# Patient Record
Sex: Female | Born: 1945
Health system: Southern US, Community
[De-identification: ages and names within clinical notes are randomized; demographics above are authoritative.]

## PROBLEM LIST (undated history)

## (undated) DIAGNOSIS — K219 Gastro-esophageal reflux disease without esophagitis: Secondary | ICD-10-CM

## (undated) DIAGNOSIS — D239 Other benign neoplasm of skin, unspecified: Secondary | ICD-10-CM

## (undated) DIAGNOSIS — C439 Malignant melanoma of skin, unspecified: Secondary | ICD-10-CM

## (undated) DIAGNOSIS — K3184 Gastroparesis: Secondary | ICD-10-CM

## (undated) DIAGNOSIS — I10 Essential (primary) hypertension: Secondary | ICD-10-CM

## (undated) HISTORY — DX: Essential (primary) hypertension: I10

## (undated) HISTORY — PX: ABDOMINAL HYSTERECTOMY: SHX81

## (undated) HISTORY — DX: Gastro-esophageal reflux disease without esophagitis: K21.9

## (undated) HISTORY — PX: TRIGGER FINGER RELEASE: SHX641

## (undated) HISTORY — DX: Other benign neoplasm of skin, unspecified: D23.9

## (undated) HISTORY — DX: Gastroparesis: K31.84

## (undated) HISTORY — PX: CARPAL TUNNEL RELEASE: SHX101

## (undated) HISTORY — DX: Malignant melanoma of skin, unspecified: C43.9

---

## 1998-07-06 ENCOUNTER — Emergency Department (HOSPITAL_COMMUNITY): Admission: EM | Admit: 1998-07-06 | Discharge: 1998-07-06 | Payer: Self-pay | Admitting: *Deleted

## 1998-08-12 ENCOUNTER — Ambulatory Visit (HOSPITAL_COMMUNITY): Admission: RE | Admit: 1998-08-12 | Discharge: 1998-08-12 | Payer: Self-pay | Admitting: Obstetrics and Gynecology

## 2000-01-05 ENCOUNTER — Ambulatory Visit (HOSPITAL_COMMUNITY): Admission: RE | Admit: 2000-01-05 | Discharge: 2000-01-05 | Payer: Self-pay | Admitting: Gastroenterology

## 2000-01-05 ENCOUNTER — Encounter: Payer: Self-pay | Admitting: Gastroenterology

## 2000-01-14 ENCOUNTER — Encounter: Payer: Self-pay | Admitting: Obstetrics and Gynecology

## 2000-01-14 ENCOUNTER — Ambulatory Visit (HOSPITAL_COMMUNITY): Admission: RE | Admit: 2000-01-14 | Discharge: 2000-01-14 | Payer: Self-pay | Admitting: Obstetrics and Gynecology

## 2000-03-22 ENCOUNTER — Ambulatory Visit: Admission: RE | Admit: 2000-03-22 | Discharge: 2000-03-22 | Payer: Self-pay | Admitting: Family Medicine

## 2000-04-19 ENCOUNTER — Other Ambulatory Visit: Admission: RE | Admit: 2000-04-19 | Discharge: 2000-04-19 | Payer: Self-pay | Admitting: Obstetrics and Gynecology

## 2000-10-12 ENCOUNTER — Encounter: Admission: RE | Admit: 2000-10-12 | Discharge: 2001-01-10 | Payer: Self-pay | Admitting: Family Medicine

## 2001-04-16 ENCOUNTER — Other Ambulatory Visit: Admission: RE | Admit: 2001-04-16 | Discharge: 2001-04-16 | Payer: Self-pay | Admitting: Obstetrics and Gynecology

## 2001-04-30 ENCOUNTER — Encounter: Payer: Self-pay | Admitting: Obstetrics and Gynecology

## 2001-04-30 ENCOUNTER — Encounter: Admission: RE | Admit: 2001-04-30 | Discharge: 2001-04-30 | Payer: Self-pay | Admitting: Obstetrics and Gynecology

## 2002-05-01 ENCOUNTER — Encounter: Admission: RE | Admit: 2002-05-01 | Discharge: 2002-05-01 | Payer: Self-pay | Admitting: Obstetrics and Gynecology

## 2002-05-01 ENCOUNTER — Encounter: Payer: Self-pay | Admitting: Obstetrics and Gynecology

## 2002-05-01 ENCOUNTER — Other Ambulatory Visit: Admission: RE | Admit: 2002-05-01 | Discharge: 2002-05-01 | Payer: Self-pay | Admitting: Obstetrics and Gynecology

## 2002-06-18 ENCOUNTER — Encounter: Payer: Self-pay | Admitting: Emergency Medicine

## 2002-06-18 ENCOUNTER — Emergency Department (HOSPITAL_COMMUNITY): Admission: EM | Admit: 2002-06-18 | Discharge: 2002-06-18 | Payer: Self-pay | Admitting: Emergency Medicine

## 2003-02-03 ENCOUNTER — Encounter: Payer: Self-pay | Admitting: Obstetrics and Gynecology

## 2003-02-03 ENCOUNTER — Encounter: Admission: RE | Admit: 2003-02-03 | Discharge: 2003-02-03 | Payer: Self-pay | Admitting: Obstetrics and Gynecology

## 2003-04-10 ENCOUNTER — Other Ambulatory Visit: Admission: RE | Admit: 2003-04-10 | Discharge: 2003-04-10 | Payer: Self-pay | Admitting: Obstetrics and Gynecology

## 2003-05-15 ENCOUNTER — Encounter: Admission: RE | Admit: 2003-05-15 | Discharge: 2003-05-15 | Payer: Self-pay | Admitting: Gastroenterology

## 2003-05-15 ENCOUNTER — Encounter: Payer: Self-pay | Admitting: Gastroenterology

## 2004-04-19 ENCOUNTER — Encounter: Admission: RE | Admit: 2004-04-19 | Discharge: 2004-04-19 | Payer: Self-pay | Admitting: Obstetrics and Gynecology

## 2004-06-18 ENCOUNTER — Other Ambulatory Visit: Admission: RE | Admit: 2004-06-18 | Discharge: 2004-06-18 | Payer: Self-pay | Admitting: Obstetrics and Gynecology

## 2004-07-16 ENCOUNTER — Emergency Department (HOSPITAL_COMMUNITY): Admission: EM | Admit: 2004-07-16 | Discharge: 2004-07-16 | Payer: Self-pay | Admitting: Emergency Medicine

## 2005-03-11 ENCOUNTER — Ambulatory Visit: Payer: Self-pay | Admitting: Gastroenterology

## 2005-04-06 ENCOUNTER — Ambulatory Visit: Payer: Self-pay | Admitting: Gastroenterology

## 2005-04-18 ENCOUNTER — Encounter: Admission: RE | Admit: 2005-04-18 | Discharge: 2005-04-18 | Payer: Self-pay | Admitting: Obstetrics and Gynecology

## 2005-05-23 ENCOUNTER — Other Ambulatory Visit: Admission: RE | Admit: 2005-05-23 | Discharge: 2005-05-23 | Payer: Self-pay | Admitting: Obstetrics and Gynecology

## 2005-07-25 ENCOUNTER — Encounter: Admission: RE | Admit: 2005-07-25 | Discharge: 2005-10-23 | Payer: Self-pay | Admitting: Internal Medicine

## 2005-09-20 ENCOUNTER — Ambulatory Visit: Payer: Self-pay | Admitting: Gastroenterology

## 2005-12-02 ENCOUNTER — Ambulatory Visit: Payer: Self-pay | Admitting: Gastroenterology

## 2005-12-09 ENCOUNTER — Ambulatory Visit: Payer: Self-pay | Admitting: Gastroenterology

## 2005-12-20 ENCOUNTER — Ambulatory Visit (HOSPITAL_COMMUNITY): Admission: RE | Admit: 2005-12-20 | Discharge: 2005-12-20 | Payer: Self-pay | Admitting: Gastroenterology

## 2006-01-02 ENCOUNTER — Ambulatory Visit: Payer: Self-pay | Admitting: Gastroenterology

## 2006-02-21 ENCOUNTER — Ambulatory Visit (HOSPITAL_COMMUNITY): Admission: RE | Admit: 2006-02-21 | Discharge: 2006-02-21 | Payer: Self-pay | Admitting: Obstetrics and Gynecology

## 2006-04-15 ENCOUNTER — Emergency Department (HOSPITAL_COMMUNITY): Admission: EM | Admit: 2006-04-15 | Discharge: 2006-04-15 | Payer: Self-pay | Admitting: Emergency Medicine

## 2006-05-11 ENCOUNTER — Other Ambulatory Visit: Admission: RE | Admit: 2006-05-11 | Discharge: 2006-05-11 | Payer: Self-pay | Admitting: Obstetrics and Gynecology

## 2007-01-10 ENCOUNTER — Ambulatory Visit (HOSPITAL_BASED_OUTPATIENT_CLINIC_OR_DEPARTMENT_OTHER): Admission: RE | Admit: 2007-01-10 | Discharge: 2007-01-10 | Payer: Self-pay | Admitting: *Deleted

## 2007-04-23 ENCOUNTER — Encounter: Admission: RE | Admit: 2007-04-23 | Discharge: 2007-04-23 | Payer: Self-pay | Admitting: Obstetrics and Gynecology

## 2007-05-31 ENCOUNTER — Emergency Department (HOSPITAL_COMMUNITY): Admission: EM | Admit: 2007-05-31 | Discharge: 2007-05-31 | Payer: Self-pay | Admitting: Emergency Medicine

## 2007-06-13 ENCOUNTER — Ambulatory Visit (HOSPITAL_BASED_OUTPATIENT_CLINIC_OR_DEPARTMENT_OTHER): Admission: RE | Admit: 2007-06-13 | Discharge: 2007-06-13 | Payer: Self-pay | Admitting: *Deleted

## 2007-09-17 ENCOUNTER — Encounter: Admission: RE | Admit: 2007-09-17 | Discharge: 2007-09-17 | Payer: Self-pay | Admitting: Obstetrics and Gynecology

## 2008-01-22 ENCOUNTER — Ambulatory Visit (HOSPITAL_BASED_OUTPATIENT_CLINIC_OR_DEPARTMENT_OTHER): Admission: RE | Admit: 2008-01-22 | Discharge: 2008-01-22 | Payer: Self-pay | Admitting: Orthopedic Surgery

## 2008-05-06 ENCOUNTER — Encounter: Admission: RE | Admit: 2008-05-06 | Discharge: 2008-05-06 | Payer: Self-pay | Admitting: Obstetrics and Gynecology

## 2008-11-18 ENCOUNTER — Encounter (HOSPITAL_COMMUNITY): Admission: RE | Admit: 2008-11-18 | Discharge: 2009-02-16 | Payer: Self-pay | Admitting: Endocrinology

## 2009-02-05 ENCOUNTER — Encounter: Admission: RE | Admit: 2009-02-05 | Discharge: 2009-02-05 | Payer: Self-pay | Admitting: Obstetrics and Gynecology

## 2009-10-10 ENCOUNTER — Emergency Department (HOSPITAL_COMMUNITY): Admission: EM | Admit: 2009-10-10 | Discharge: 2009-10-10 | Payer: Self-pay | Admitting: Emergency Medicine

## 2010-05-28 ENCOUNTER — Encounter: Payer: Self-pay | Admitting: Gastroenterology

## 2010-11-08 ENCOUNTER — Encounter
Admission: RE | Admit: 2010-11-08 | Discharge: 2010-11-08 | Payer: Self-pay | Source: Home / Self Care | Attending: Obstetrics and Gynecology | Admitting: Obstetrics and Gynecology

## 2010-11-14 ENCOUNTER — Encounter: Payer: Self-pay | Admitting: Obstetrics and Gynecology

## 2010-11-25 NOTE — Letter (Signed)
Summary: Colonoscopy Letter  [facility]  [address] [place], [place] [address]   Phone: [phone]  Fax: [phone]      May 28, 2010 MRN: [id number]   [facility] [address] [place], [place]  [address]   Dear [name],   According to your medical record, it is time for you to schedule a Colonoscopy. The American Cancer Society recommends this procedure as a method to detect early colon cancer. Patients with a family history of colon cancer, or a personal history of colon polyps or inflammatory bowel disease are at increased risk.  This letter has been generated based on the recommendations made at the time of your procedure. If you feel that in your particular situation this may no longer apply, please contact our office.  Please call our office at [phone] to schedule this appointment or to update your records at your earliest convenience.  Thank you for cooperating with [place] to provide you with the very best care possible.   Sincerely,  Barbette Hair. Arlyce Dice, M.D.  [facility] [phone]

## 2011-01-24 LAB — GLUCOSE, CAPILLARY: Glucose-Capillary: 178 mg/dL — ABNORMAL HIGH (ref 70–99)

## 2011-01-24 LAB — CBC
HCT: 35.8 % — ABNORMAL LOW (ref 36.0–46.0)
MCV: 81.6 fL (ref 78.0–100.0)
Platelets: 225 10*3/uL (ref 150–400)

## 2011-01-24 LAB — BASIC METABOLIC PANEL
BUN: 8 mg/dL (ref 6–23)
Chloride: 104 mEq/L (ref 96–112)
GFR calc non Af Amer: >60 mL/min (ref >60)
Glucose, Bld: 157 mg/dL — ABNORMAL HIGH (ref 70–99)

## 2011-01-24 LAB — DIFFERENTIAL
Basophils Absolute: 0.1 10*3/uL (ref 0.0–0.1)
Lymphs Abs: 3.2 10*3/uL (ref 0.7–4.0)
Monocytes Absolute: 0.6 10*3/uL (ref 0.1–1.0)
Monocytes Relative: 7 % (ref 3–12)

## 2011-03-08 NOTE — Op Note (Signed)
NAMECATHELEEN, Jamie Burke              ACCOUNT NO.:  [id number]   MEDICAL RECORD NO.:  [id number]          PATIENT TYPE:  AMB   LOCATION:  DSC                          FACILITY:  [facility]   PHYSICIAN:  Katy Fitch. Sypher, M.D. DATE OF BIRTH:  1946/06/20   DATE OF PROCEDURE:  01/22/2008  DATE OF DISCHARGE:                               OPERATIVE REPORT   PREOPERATIVE DIAGNOSIS:  Severe left carpal tunnel syndrome with type 2  diabetes, insulin dependent.   POSTOPERATIVE DIAGNOSIS:  Severe left carpal tunnel syndrome with type 2  diabetes, insulin dependent.   OPERATION:  Release of left transverse carpal ligament with two incision  technique due to a rather substantial distal forearm accessory  transverse carpal ligament.   OPERATING SURGEON:  Katy Fitch. Sypher, M.D.   ASSISTANT:  Jonni Sanger, P.A.   ANESTHESIA:  General by LMA.   SUPERVISING ANESTHESIOLOGIST:  Guadalupe Maple, M.D.   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  She has a history of type 2 diabetes  currently treated with insulin.  She has had severe left hand numbness  and shoulder pain.  Clinical examination revealed signs of profound  impingement and rotator cuff tendinopathy and clinical left carpal  tunnel syndrome.  Electrodiagnostic studies confirmed very severe delay  of her motor and sensory conductions, and her physical examination  documented thenar atrophy.   Due to a failure to respond to nonoperative measures, she is brought to  the operating room at this time for release of her left transverse  carpal ligament.   PROCEDURE:  Jamie Burke is brought to the operating room and placed  in supine position on the operating table.   Following the induction of general anesthesia by LMA technique under Dr.  Morley Kos strict supervision, Jamie Burke was prepped with  Betadine soap and solution, and sterilely draped.  A pneumatic  tourniquet  was applied to the proximal left brachium.  Following  exsanguination of the left Burke with an Esmarch bandage, the arterial  tourniquet was inflated to 220 mmHg.   The procedure commenced with a short incision in the line of the ring  finger in the palm.  The subcutaneous tissues were carefully divided,  identifying the palmar fascia.  The fascia was split in line of its  fibers, revealing the superficial palmar arch and the distal margin of  the transverse carpal ligament.  The common sensory branches of the  median nerve were followed back to the median nerve proper.  The median  nerve was gently separated from the transverse carpal ligament with the  aid of a [place] 4 elevator followed by subcutaneous release of the  ulnar aspect of the transverse carpal ligament extending into the distal  forearm.   A very thick band of fascia was noted at the proximal wrist flexion  crease.  This was not able to be release safely through the palmar  incision.  Therefore, a second transverse incision was fashioned in the  distal palmar crease.   Care was taken to identify the  palmaris longus tendon.  The fascia was  isolated and subsequently split with scissors 5 cm above the distal  wrist flexion crease.   The ulnar bursa was noted be quite fibrotic.  The median nerve was  edematous.  There no masses or other predicaments noted.   The wound was then inspected for bleeding points followed by repair of  the skin with intradermal 3-0 Prolene suture.   A compressive dressing was applied with a volar plaster splint,  maintaining the wrist in 5 degrees of dorsiflexion.   There were no apparent complications.   Jamie Burke tolerated surgery and anesthesia well.  She was transferred to  the recovery room with stable signs.  For aftercare, she is provided a  prescription for Percocet 5 mg one p.o. q.4-6h. p.r.n. pain, 20 tablets  without refill.      Katy Fitch Sypher, M.D.  Electronically  Signed     RVS/MEDQ  D:  01/22/2008  T:  01/22/2008  Job:  [id number]   cc:   Leonie Man, M.D.

## 2011-03-08 NOTE — Op Note (Signed)
NAMEBRIT, CARBONELL              ACCOUNT NO.:  [id number]   MEDICAL RECORD NO.:  [id number]          PATIENT TYPE:  AMB   LOCATION:  DSC                          FACILITY:  [facility]   PHYSICIAN:  Lowell Bouton, M.D.DATE OF BIRTH:  [birth year]   DATE OF PROCEDURE:  06/13/2007  DATE OF DISCHARGE:                               OPERATIVE REPORT   PREOPERATIVE DIAGNOSIS:  Right ring trigger finger.   POSTOPERATIVE DIAGNOSIS:  Right ring trigger finger.   PROCEDURE:  Release of A1 pulley right ring finger.   SURGEON:  Lowell Bouton, M.D.   ANESTHESIA:  0.5% Marcaine local with sedation.   OPERATIVE FINDINGS:  The patient had some tenosynovitis surrounding her  flexor tendons.  There was no nodular formation of the tendon.   PROCEDURE:  Under 0.5% Marcaine local anesthesia with a tourniquet on  the right arm, the right hand was prepped and draped in usual fashion  after exsanguinating the limb, the tourniquet was inflated to 250 mmHg.  The transverse incision was made in line with the distal palmar crease  over the ring finger and carried through the subcutaneous tissues.  Blunt dissection was carried through the superficial palmar fascia down  to the tendon.  The A1 pulley was then incised with a knife and released  completely with the scissors.  The tendons were then pulled out of the  wound and inspected.  There was found to be significant tenosynovitis  which was debrided.  The wound was then irrigated copiously.  There was  no further triggering.  The skin was closed with 4-0 nylon sutures.  Sterile dressings were applied.  The patient tolerated the procedure  well went to the recovery room awake and stable in good condition.      Lowell Bouton, M.D.  Electronically Signed     EMM/MEDQ  D:  06/13/2007  T:  06/14/2007  Job:  [id number]

## 2011-03-11 NOTE — Op Note (Signed)
[name], Jamie Burke              ACCOUNT NO.:  [id number]   MEDICAL RECORD NO.:  [id number]          PATIENT TYPE:  AMB   LOCATION:  DSC                          FACILITY:  [facility]   PHYSICIAN:  Lowell Bouton, M.D.DATE OF BIRTH:  [birth year]   DATE OF PROCEDURE:  01/10/2007  DATE OF DISCHARGE:                               OPERATIVE REPORT   PREOPERATIVE DIAGNOSIS:  Right carpal tunnel syndrome.   POSTOPERATIVE DIAGNOSIS:  Right carpal tunnel syndrome.   PROCEDURE:  Decompression median nerve. right carpal tunnel.   SURGEON:  Lowell Bouton, M.D.   ANESTHESIA:  General.   OPERATIVE FINDINGS:  The patient had no masses in the carpal canal.  The  carpal tunnel was fairly tight and the median nerve was significantly  compressed.  The motor branch was intact.   PROCEDURE:  Under general anesthesia with a tourniquet on the right arm,  the right hand was prepped and draped in usual fashion and after  exsanguinating the limb, the tourniquet was inflated to 250 mmHg.  A 3  cm longitudinal incision was made in the palm just ulnar to the thenar  crease and carried through the subcutaneous tissues.  Bleeding points  were coagulated.  Blunt dissection was carried through the superficial  palmar fascia distal to the transverse carpal ligament.  A hemostat was  then placed in the carpal canal up against the hook of the hamate and  the transverse carpal ligament was divided on the ulnar border of the  median nerve.  The proximal end of the ligament was divided with the  scissors after the nerve was dissected away from the undersurface of the  ligament.  The carpal canal was then palpated and was found to be  adequately decompressed.  The nerve was examined and the motor branch  was identified.  The wound was irrigated with saline.  The skin was  closed with 4-0 nylon sutures.  Half percent Marcaine was inserted in  the skin edges for pain control. The patient was placed in  a volar wrist  splint.  She tolerated the procedure well and went to the recovery room  awake and stable in good condition.      Lowell Bouton, M.D.  Electronically Signed     EMM/MEDQ  D:  01/10/2007  T:  01/10/2007  Job:  [id number]

## 2011-03-15 ENCOUNTER — Ambulatory Visit (INDEPENDENT_AMBULATORY_CARE_PROVIDER_SITE_OTHER): Payer: 59 | Admitting: Family Medicine

## 2011-03-15 ENCOUNTER — Encounter: Payer: Self-pay | Admitting: Family Medicine

## 2011-03-15 VITALS — BP 127/75 | HR 86 | Temp 97.9°F | Ht 64.5 in | Wt 222.0 lb

## 2011-03-15 DIAGNOSIS — E119 Type 2 diabetes mellitus without complications: Secondary | ICD-10-CM

## 2011-03-15 DIAGNOSIS — M542 Cervicalgia: Secondary | ICD-10-CM

## 2011-03-15 DIAGNOSIS — I10 Essential (primary) hypertension: Secondary | ICD-10-CM | POA: Insufficient documentation

## 2011-03-15 LAB — POCT GLYCOSYLATED HEMOGLOBIN (HGB A1C): Hemoglobin A1C: 8.6

## 2011-03-15 MED ORDER — LISINOPRIL-HYDROCHLOROTHIAZIDE 20-25 MG PO TABS: 1.0000 | ORAL_TABLET | Freq: Every day | ORAL | Status: DC

## 2011-03-15 MED ORDER — INSULIN LISPRO PROT & LISPRO (75-25 MIX) 100 UNIT/ML ~~LOC~~ SUSP: 65.0000 [IU] | Freq: Two times a day (BID) | SUBCUTANEOUS | Status: DC

## 2011-03-15 NOTE — Patient Instructions (Addendum)
Thank you for coming in today. For your neck pain keep active and take tylenol (2 pills every 6 hours as needed).  If it gets worse let me know.  For DM: Increase your insulin to 65 twice a day. Keep a reccord of your sugars.  The morning value should be around 80-120. Increase the night insulin by 2 units a day until you get there. If you start getting low and feel it let me know.  Increase the morning dose of insulin to try to get your lunch and dinner values less than 200.   Eat less carbohydrates.   Come back in 3-6 months unless I say otherwise when I call you about you labs.

## 2011-03-15 NOTE — Assessment & Plan Note (Signed)
Meter shows marginal control.  Discussed titration of insulin and how to do that. Will check A1c today.  Also will check LDL.  Will f/u in a few months. Gave red flags.

## 2011-03-15 NOTE — Progress Notes (Signed)
New patient: Was seeing Dr. Renae Gloss on [address] but lost insurance. Tx here for cost reduction.   DM: Stable. Takes Novolog 75/25 60 units BID. Her glucometer today shows values of low 102 and high 288. She thinks her last A1c was 7. No symptomatic lows. Feels well. Can currently afford this medication.   HTN: Takes Lisinopril/HCTZ/ Doing well. No CP/SOB/Palpitations or syncope. Is happy with current medications.   Neck Pain: Bl Paraspinous and trap muscle groups x 3 weeks. Described as soreness. No radicular symptoms and no hand weakness. Has taken mobic before and still has some left. Not very interested in additional medications.   PMH reviewed.  ROS as above otherwise neg  Exam:  Vs noted.  Gen: Well NAD HEENT: EOMI, PERRL, MMM Lungs: CTABL Nl WOB Heart: RRR no MRG Abd: NABS, NT, ND Exts: Non edematous BL  LE MSK: Nontender over cervical midline. BL paraspinus muscle tenderness. Also BL trapezeus TTP.  Hands normal grip strength and sensation BL.

## 2011-03-15 NOTE — Assessment & Plan Note (Signed)
X 3 weeks. No red flags.  Plan tx with continued activity, and tylenol. Mobic PRN extreme pain. Has old supply.  Red flags reviewed. Will follow if not better.

## 2011-03-15 NOTE — Assessment & Plan Note (Signed)
Currently well controlled on her medications. Will assess CMP today and follow.

## 2011-03-16 LAB — COMPLETE METABOLIC PANEL WITH GFR
ALT: 13 U/L (ref 0–35)
AST: 17 U/L (ref 0–37)
Alkaline Phosphatase: 71 U/L (ref 39–117)
BUN: 7 mg/dL (ref 6–23)
CO2: 27 mEq/L (ref 19–32)
Chloride: 103 mEq/L (ref 96–112)
GFR, Est African American: >60 mL/min (ref >60)
Glucose, Bld: 145 mg/dL — ABNORMAL HIGH (ref 70–99)
Potassium: 3.9 mEq/L (ref 3.5–5.3)
Sodium: 141 mEq/L (ref 135–145)

## 2011-03-16 LAB — LDL CHOLESTEROL, DIRECT: Direct LDL: 137 mg/dL — ABNORMAL HIGH

## 2011-03-17 ENCOUNTER — Encounter: Payer: Self-pay | Admitting: Family Medicine

## 2011-04-07 ENCOUNTER — Ambulatory Visit (INDEPENDENT_AMBULATORY_CARE_PROVIDER_SITE_OTHER): Payer: Self-pay | Admitting: Family Medicine

## 2011-04-07 ENCOUNTER — Encounter: Payer: Self-pay | Admitting: Family Medicine

## 2011-04-07 DIAGNOSIS — E119 Type 2 diabetes mellitus without complications: Secondary | ICD-10-CM

## 2011-04-07 DIAGNOSIS — E669 Obesity, unspecified: Secondary | ICD-10-CM | POA: Insufficient documentation

## 2011-04-07 DIAGNOSIS — E785 Hyperlipidemia, unspecified: Secondary | ICD-10-CM | POA: Insufficient documentation

## 2011-04-07 MED ORDER — METFORMIN HCL 500 MG PO TABS: 500.0000 mg | ORAL_TABLET | Freq: Two times a day (BID) | ORAL | Status: DC

## 2011-04-07 MED ORDER — PRAVASTATIN SODIUM 40 MG PO TABS: 40.0000 mg | ORAL_TABLET | Freq: Every day | ORAL | Status: DC

## 2011-04-07 NOTE — Assessment & Plan Note (Signed)
Body mass index is 37.01 kg/(m^2). Poor diet and insufficient exercise.  Will work on diet as per DM discussion. Will follow.

## 2011-04-07 NOTE — Patient Instructions (Signed)
Thank you for coming in today. Start pravastatin at night daily.  Start metformin 1    500mg  pill daily for 1 week. After 1 week start taking it twice a week. For the first two weeks you may expect to have some diarrhea or nausea. This gets better in almost everyone who takes this medicine.  I would like you to make a food, exercise and blood sugar log for the next two weeks. Write down what and when you eat and when you exercise and what your blood sugar is. Also let me know how much insulin you are taking.  Come back in 2 weeks to follow up you diabetes. We can do this on a budget.

## 2011-04-07 NOTE — Assessment & Plan Note (Signed)
Not under good control.  Lab Results  Component Value Date   HGBA1C 8.6 03/15/2011   Diet is poor. High in carbohydrates and inconstant.  Based on history she has not had a good trial of metformin with her diabetes.  Plan: Start metformin. In addition will ask Ms Lindquist to do a full food, exercise and glucose diary until a visit in 2 weeks. At that time we will talk exculsivly about diet and titrate insulin as needed.

## 2011-04-07 NOTE — Assessment & Plan Note (Signed)
LDL is above recommend goals for patients with DM. Her goal should be <100 and ideally <70.  No symptoms of CAD or PVD currently. Already trying a low fat diet.  Plan: Start pravastatin 40 qhs and check lipids in 6 weeks or so.

## 2011-04-07 NOTE — Progress Notes (Signed)
Here to follow up diabetes and hyperlipidemia. Was seen 2 weeks ago and had LDL and A1c obtained.  1) CHRONIC DIABETES  Disease Monitoring  Blood Sugar Ranges: 75 (1pm) low Max is over 200s. Usually fasting glucose is 150 or so.  Polyuria: no   Visual problems: no   Medication Compliance: yes  Medication Side Effects  Hypoglycemia: yes, as above. Mild. Will feel symptoms as weakness with this.    Has tried metformin in the past. Took it for one day and had GI upset and has since discontinued it.   2) Hyperlipidemia: LDL 130s at the last visit. Not on a statin. Never has been. Would like something less expensive than a name brand. Is worried about the potential liver toxicty of statins. Is eating a diet lower in saturated fats currently.   3) Obesity: Has been to DM nutrition in the past. Thinks her current diet isnt very good. 1 day food recall reveals no fruit, mostly high carbohydrates, and 1 serving of vegetables. Does exercise a few days a week by walking.   PMH reviewed.  ROS as above otherwise neg  Exam:  Vs noted.  Gen: Well NAD HEENT: EOMI,  MMM Lungs: CTABL Nl WOB Heart: RRR no MRG Abd: NABS, NT, ND Exts: Non edematous BL  LE

## 2011-04-26 ENCOUNTER — Ambulatory Visit: Payer: Self-pay | Admitting: Family Medicine

## 2011-05-23 ENCOUNTER — Encounter: Payer: Self-pay | Admitting: Family Medicine

## 2011-05-23 ENCOUNTER — Ambulatory Visit (INDEPENDENT_AMBULATORY_CARE_PROVIDER_SITE_OTHER): Payer: Self-pay | Admitting: Family Medicine

## 2011-05-23 DIAGNOSIS — S4980XA Other specified injuries of shoulder and upper arm, unspecified arm, initial encounter: Secondary | ICD-10-CM

## 2011-05-23 DIAGNOSIS — J3489 Other specified disorders of nose and nasal sinuses: Secondary | ICD-10-CM

## 2011-05-23 DIAGNOSIS — S46009A Unspecified injury of muscle(s) and tendon(s) of the rotator cuff of unspecified shoulder, initial encounter: Secondary | ICD-10-CM | POA: Insufficient documentation

## 2011-05-23 DIAGNOSIS — J302 Other seasonal allergic rhinitis: Secondary | ICD-10-CM | POA: Insufficient documentation

## 2011-05-23 DIAGNOSIS — S46909A Unspecified injury of unspecified muscle, fascia and tendon at shoulder and upper arm level, unspecified arm, initial encounter: Secondary | ICD-10-CM

## 2011-05-23 DIAGNOSIS — J309 Allergic rhinitis, unspecified: Secondary | ICD-10-CM

## 2011-05-23 NOTE — Assessment & Plan Note (Signed)
See instructions.   Short-term course of Aspirin plus second gen H1 blocker.  No insurance, intranasal steroid would be most beneficial, but unable to afford.

## 2011-05-23 NOTE — Assessment & Plan Note (Signed)
See instructions. Likely strain, less likely tear.  Conservative therapy.  SHe declined steroid injection today. May need imaging if no improvement.

## 2011-05-23 NOTE — Progress Notes (Signed)
  Subjective:    Patient ID: Jamie Burke, female    DOB: 03-19-1946, 65 y.o.   MRN: [id number]  HPI 1.  Right Shoulder pain:  Present for 2 weeks.  Initially painful to sleep.  Now improved since she is taking Ibuprofen 400 mg once daily, starting 3 days ago.  Initially unable to lift arm above head.  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  Similar symptoms about 1 year ago, improved with Mobic.  Xrays of shoulder at [facility] showed mild arthritis.  Has some Mobic left, tried it, no relief.  No left shoulder pain, no numbness, paresthesias, weakness in arm or hands.    3.  Nasal congestion:  Present for most of the year.  Long time problem for patient.  Describes nasal "stuffiness" as well as sinus congestion, inability to blow nose.  Wakes in AM with bilateral red eyes.  Has tried Claritin in past with minimal relief.  Not taking anything now for it, including no OTC meds or nasal sprays.  No fevers or chills  3.  Spot on nose:  Has had melanoma in past.  Wants to be sure this is okay.  Feels like a scratch, present since Friday.  Glasses rub spot.  Has put neosporin on it with some improvement.    Review of Systems See HPI above for review of systems.       Objective:   Physical Exam Gen:  Alert, cooperative patient who appears stated age in no acute distress.  Vital signs reviewed. Nose:  Mild scratch located on Left side bridge of nose.  No drainage noted.  BL turbinates boggy and edematous.   Eyes:  No conjunctivitis noted. Ears:  TM's clear BL.   MSK:  Left shoulder WNL.  RIght shoulder with minimal deltoid atrophy noted.  Good internal/external rotation.  Lift-off test negative for pain, able to withstand opposition.  Empty can test severely limited by pain.  Strength 5/5 BL upper extremities, performed prior to empty can test.  After supraspinatus testing, patient in pain and unable to perform further testing.        Assessment &  Plan:   No problem-specific assessment & plan notes found for this encounter.

## 2011-05-23 NOTE — Patient Instructions (Addendum)
For your shoulder:  Take Ibuprofen 400 mg (you can take 600 mg if you need it) twice daily for the next week.  Then take it as needed.   Do the exercises below 2-3 times a day, each shoulder, to strengthen the muscles.  Use Heat twice daily on your shoulder as well.     For your allergies:  Try Afrin (Oxymetazoline) twice daily for no more than 3 days.  You can use nasal saline as well to clean out the area.   Also try Zyrtec (Cetirizine), Allegra (Fexonfenadine), or Claritin (Loratidine) once daily.  Use whichever works best for you.    Make a follow-up appt with your PCP to discuss your diabetes.    Rotator Cuff Injury   with Rehab   Rotator cuff surgery is only recommended for individuals who have experienced persistent disability for greater than 3 months of non-surgical (conservative) treatment. Surgery is not necessary but is recommended for individuals who experience difficulty completing daily activities or athletes who are unable to compete. Rotator cuff tears do not usually heal without surgical intervention. If left alone small rotator cuff tears usually become larger.  Younger athletes who have a rotator cuff tear may be recommended for surgery without attempting conservative rehabilitation. The purpose of surgery is to regain function of the shoulder joint and eliminate pain associated with the injury. In addition to repairing the tendon tear, the surgery often removes a portion of the bony roof of the shoulder (acromion) as well as the chronically thickened and inflamed membrane below the acromion (subacromial bursa).        EXERCISES    RANGE OF MOTION AND STRETCHING EXERCISES - Rotator Cuff Tear, Surgery For These exercises may help you restore your elbow mobility once your physician has discontinued your immobilization period.  Beginning these before your provider's approval may result in delayed healing. Your symptoms may resolve with or without further involvement from your  physician, physical therapist or athletic trainer. While completing these exercises, remember:    Restoring tissue flexibility helps normal motion to return to the joints. This allows healthier, less painful movement and activity.  An effective stretch should be held for at least 30 seconds.  A stretch should never be painful. You should only feel a gentle lengthening or release in the stretch.     ROM - Pendulum   Bend at the waist so that your __________ arm falls away from your body. Support yourself with your opposite hand on a solid surface, such as a table or a countertop.  Your __________ arm should be perpendicular to the ground.  If it is not perpendicular, you need to lean over farther. Relax the muscles in your __________ arm and shoulder as much as possible.  Gently sway your hips and trunk so they move your __________ arm without any use of your __________ shoulder muscles.  Progress your movements so that your __________ arm moves side to side, then forward and backward, and finally, both clockwise and counterclockwise.  Complete __________ repetitions in each direction. Many people use this exercise to relieve discomfort in their shoulder as well as to gain range of motion. Repeat __________ times. Complete this exercise__________ times per day.    STRETCH - Flexion, Seated   Sit in a firm chair so that your __________ forearm can rest on a table or on a table or countertop. Your __________ elbow should rest below the height of your shoulder so that your shoulder feels supported and not  tense or uncomfortable.  Keeping your __________ shoulder relaxed, lean forward at your waist, allowing your __________ hand to slide forward. Bend forward until you feel a moderate stretch in your shoulder, but before you feel an increase in your pain.  Hold __________ seconds. Slowly return to your starting position. Repeat __________ times. Complete this exercise __________ times per day.        STRETCH - Flexion, Standing    Stand with good posture. With an underhand grip on your __________ and an overhand grip on the opposite hand, grasp a broomstick or cane so that your hands are a little more than shoulder-width apart.  Keeping your __________ elbow straight and shoulder muscles relaxed, push the stick with your opposite hand to raise your __________ arm in front of your body and then overhead. Raise your arm until you feel a stretch in your __________ shoulder, but before you have increased shoulder pain.  Try to avoid shrugging your __________ shoulder as your arm rises by keeping your shoulder blade tucked down and toward your mid-back spine.  Hold __________ seconds.  Slowly return to the starting position.   Repeat __________ times. Complete this exercise __________ times per day.       STRETCH - Abduction, Supine    Stand with good posture. With an underhand grip on your __________ and an overhand grip on the opposite hand, grasp a broomstick or cane so that your hands are a little more than shoulder-width apart.  Keeping your __________ elbow straight and shoulder muscles relaxed, push the stick with your opposite hand to raise your __________ arm out to the side of your body and then overhead.  Raise your arm until you feel a stretch in your __________ shoulder, but before you have increased shoulder pain.  Try to avoid shrugging your __________ shoulder as your arm rises by keeping your shoulder blade tucked down and toward your mid-back spine. Hold __________ seconds.  Slowly return to the starting position.   Repeat __________ times. Complete this exercise __________ times per day.      ROM - Flexion, Active-Assisted  Lie on your back. You may bend your knees for comfort.  Grasp a broomstick or cane so your hands are about shoulder-width apart.  Your __________ hand should grip the end of the stick/cane so that your hand is positioned "thumbs-up," as if  you were about to shake hands.  Using your healthy arm to lead, raise your __________ arm overhead until you feel a gentle stretch in your shoulder.  Hold __________ seconds.    Use the stick/cane to assist in returning your __________ arm to its starting position. Repeat __________ times. Complete this exercise __________ times per day.        STRETCH - External Rotation   Tuck a folded towel or small ball under your __________ upper arm. Grasp a broomstick or cane with an underhand grasp a little more than shoulder width apart. Bend your elbows to 90 degrees.  Stand with good posture or sit in a chair without arms.    Use your strong arm to push the stick across your body. Do not allow the towel or ball to fall.  This will rotate your __________ arm away from your abdomen. Using the stick turn/rotate your hand and forearm away from your body. Hold __________ seconds. Repeat __________ times. Complete this exercise __________ times per day.       STRENGTHENING EXERCISES - Rotator Cuff Tear, Surgery For These exercises may  help you begin to restore your elbow strength in the initial stage of your rehabilitation.  Your physician will determine when you begin these exercises depending on the severity of your injury and the integrity of your repaired tissues. Beginning these before your provider's approval may result in delayed healing. While completing these exercises, remember:    Muscles can gain both the endurance and the strength needed for everyday activities through controlled exercises.  Complete these exercises as instructed by your physician, physical therapist or athletic trainer. Progress the resistance and repetitions only as guided.  You may experience muscle soreness or fatigue, but the pain or discomfort you are trying to eliminate should never worsen during these exercises.  If this pain does worsen, stop and make certain you are following the directions exactly. If the pain is  still present after adjustments, discontinue the exercise until you can discuss the trouble with your clinician.    STRENGTH - Shoulder Flexion, Isometric    With good posture and facing a wall, stand or sit about 4-6 inches away.  Keeping your __________ elbow straight, gently press the top of your fist into the wall.  Increase the pressure gradually until you are pressing as hard as you can without shrugging your shoulder or increasing any shoulder discomfort.  Hold __________ seconds.Release the tension slowly. Relax your shoulder muscles completely before you do the next repetition.   Repeat __________ times. Complete this exercise __________ times per day.      STRENGTH - Shoulder Abductors, Isometric    With good posture, stand or sit about 4-6 inches from a wall with your __________ side facing the wall.    Bend your __________ elbow. Gently press your __________ elbow into the wall.  Increase the pressure gradually until you are pressing as hard as you can without shrugging your shoulder or increasing any shoulder discomfort.  Hold __________ seconds.  Release the tension slowly. Relax your shoulder muscles completely before you do the next repetition.   Repeat __________ times. Complete this exercise __________ times per day.      STRENGTH - Internal Rotators, Isometric    Keep your __________ elbow at your side and bend it 90 degrees.  Step into a door frame so that the inside of your __________ wrist can press against the door frame without your upper arm leaving your side.  Gently press your __________ wrist into the door frame as if you were trying to draw the palm of your hand to your abdomen. Gradually increase the tension until you are pressing as hard as you can without shrugging your shoulder or increasing any shoulder discomfort.  Hold __________ seconds.    Release the tension slowly. Relax your shoulder muscles completely before you do the next repetition.     Repeat __________ times. Complete this exercise __________ times per day.       STRENGTH - External Rotators, Isometric    Keep your __________ elbow at your side and bend it 90 degrees.  Step into a door frame so that the outside of your __________ wrist can press against the door frame without your upper arm leaving your side.  Gently press your __________ wrist into the door frame as if you were trying to swing the back of your hand away from your abdomen. Gradually increase the tension until you are pressing as hard as you can without shrugging your shoulder or increasing any shoulder discomfort.  Hold __________ seconds.    Release the tension slowly.  Relax your shoulder muscles completely before you do the next repetition.   Repeat __________ times. Complete this exercise __________ times per day.     Document Released: 10/10/2005  Document Re-Released: 11/01/2009 [facility] Patient Information 2011 [place], [place].

## 2011-05-23 NOTE — Assessment & Plan Note (Signed)
Scratch on nose.  To return if doesn't heal.  Not cancerous appearing, definitely not a melanoma which is what she has history of.

## 2011-05-27 ENCOUNTER — Telehealth (INDEPENDENT_AMBULATORY_CARE_PROVIDER_SITE_OTHER): Payer: Self-pay | Admitting: General Surgery

## 2011-05-27 NOTE — Telephone Encounter (Signed)
Needs a referral to Nutrition for appt with Amy. She is scheduled for Monday 06/05/11.

## 2011-05-30 ENCOUNTER — Encounter: Payer: BC Managed Care – PPO | Admitting: *Deleted

## 2011-05-30 ENCOUNTER — Encounter: Payer: Self-pay | Admitting: *Deleted

## 2011-05-30 NOTE — Progress Notes (Deleted)
  Follow-up visit: *** Weeks Post-Operative *** Surgery  Medical Nutrition Therapy:  Appt start time: {Time; Appointment:21385} end time:  {Time; Appointment:21385}.  Assessment:  Primary concerns today: post-operative bariatric surgery nutrition management.  Weight today: *** Weight change: *** Total weight lost: *** BMI: *** Weight goal: *** % Weight goal met: ***  24-hr recall:  B (*** AM): *** Snk (***  AM): ***   L (*** PM): *** Snk (*** PM): ***  D (*** PM): *** Snk (*** PM): ***  Fluid intake: ***  Estimated total protein intake: ***  Medications: *** Supplementation: ***  CBG monitoring: *** Average CBG per patient: *** Last patient reported A1c: ***  Using straws: *** Drinking while eating:*** Hair loss: *** Carbonated beverages: *** N/V/D/C: *** Dumping syndrome: *** Last Lap-Band fill: ***  Recent physical activity:  ***  Progress Towards Goal(s):  In progress.   Nutritional Diagnosis:  Muir-3.3 Overweight/obesity As related to recent bariatric surgery.  As evidenced by pt following bariatric surgery dietary guidelines for continued weight loss.    Intervention:    Follow Phase 3B: High Protein + Non-Starchy Vegetables  Eat 3-6 small meals/snacks, every 3-5 hrs  Increase lean protein foods to meet 80g goal  Increase fluid intake to 64oz +  Add 15 grams of carbohydrate (fruit, whole grain, starchy vegetable) with meals  Avoid drinking 15 minutes before, during and 30 minutes after eating  Aim for >30 min of physical activity daily  Monitoring/Evaluation:  Dietary intake, exercise, lap band fills, and body weight. Follow up in *** months for *** month post-op visit.

## 2011-06-03 ENCOUNTER — Telehealth: Payer: Self-pay | Admitting: Family Medicine

## 2011-06-03 NOTE — Telephone Encounter (Signed)
Pt is out of insulin and doesn't have any money and can't get it until Wed.  Wants to know if we have any that she can have until wed.

## 2011-06-03 NOTE — Telephone Encounter (Signed)
Advised patient that we do not have the kind of insulin that she uses . She plans to borrow money to purchase.

## 2011-07-04 ENCOUNTER — Telehealth: Payer: Self-pay | Admitting: Family Medicine

## 2011-07-04 NOTE — Telephone Encounter (Signed)
Received call from patient that she is having cough and congestion and wanted something called in.  Explained to her that it is not our policy to call medications in without being seen.  She states she will call to make an appointment in the morning.  No fever, chills or shortness of breath.  Advised that she can try mucinex to help with the rattling in her chest and ibuprofen for pain.

## 2011-07-18 LAB — BASIC METABOLIC PANEL
BUN: 9
Calcium: 9.5
Chloride: 103
Creatinine, Ser: 0.7
GFR calc non Af Amer: >60
Glucose, Bld: 139 — ABNORMAL HIGH
Potassium: 3.6

## 2011-07-18 LAB — POCT HEMOGLOBIN-HEMACUE: Hemoglobin: 10.9 — ABNORMAL LOW

## 2011-08-05 LAB — I-STAT 8, (EC8 V) (CONVERTED LAB)
BUN: 10
Chloride: 106
Glucose, Bld: 180 — ABNORMAL HIGH
Potassium: 4.2
pCO2, Ven: 30.4 — ABNORMAL LOW
pH, Ven: 7.542 — ABNORMAL HIGH

## 2011-08-05 LAB — POCT HEMOGLOBIN-HEMACUE
Hemoglobin: 11.6 — ABNORMAL LOW
Operator id: 12362

## 2011-08-08 LAB — I-STAT 8, (EC8 V) (CONVERTED LAB)
Acid-Base Excess: 2
Bicarbonate: 26.2 — ABNORMAL HIGH
Glucose, Bld: 96
Hemoglobin: 12.6
Operator id: 192351
Potassium: 3.6
Sodium: 140
TCO2: 27

## 2011-11-02 ENCOUNTER — Other Ambulatory Visit (HOSPITAL_COMMUNITY): Payer: Self-pay | Admitting: Obstetrics and Gynecology

## 2011-11-02 DIAGNOSIS — Z1231 Encounter for screening mammogram for malignant neoplasm of breast: Secondary | ICD-10-CM

## 2011-11-23 ENCOUNTER — Ambulatory Visit (HOSPITAL_COMMUNITY)
Admission: RE | Admit: 2011-11-23 | Discharge: 2011-11-23 | Disposition: A | Discharge status: Home / Self Care | Payer: Self-pay | Source: Ambulatory Visit | Attending: Obstetrics and Gynecology | Admitting: Obstetrics and Gynecology

## 2011-11-23 DIAGNOSIS — Z1231 Encounter for screening mammogram for malignant neoplasm of breast: Secondary | ICD-10-CM

## 2012-02-29 ENCOUNTER — Emergency Department (INDEPENDENT_AMBULATORY_CARE_PROVIDER_SITE_OTHER)
Admission: EM | Admit: 2012-02-29 | Discharge: 2012-02-29 | Disposition: A | Discharge status: Home / Self Care | Payer: BC Managed Care – PPO | Source: Home / Self Care | Attending: Emergency Medicine | Admitting: Emergency Medicine

## 2012-02-29 ENCOUNTER — Encounter (HOSPITAL_COMMUNITY): Payer: Self-pay

## 2012-02-29 DIAGNOSIS — G43009 Migraine without aura, not intractable, without status migrainosus: Secondary | ICD-10-CM

## 2012-02-29 MED ORDER — ACETAMINOPHEN 325 MG PO TABS
650.0000 mg | ORAL_TABLET | Freq: Once | ORAL | Status: AC
  Administered 2012-02-29: 650 mg via ORAL

## 2012-02-29 MED ORDER — ONDANSETRON 8 MG PO TBDP: 8.0000 mg | ORAL_TABLET | Freq: Three times a day (TID) | ORAL | Status: AC | PRN

## 2012-02-29 MED ORDER — SUMATRIPTAN SUCCINATE 50 MG PO TABS: 50.0000 mg | ORAL_TABLET | ORAL | Status: DC | PRN

## 2012-02-29 MED ORDER — ACETAMINOPHEN 325 MG PO TABS
ORAL_TABLET | ORAL | Status: AC
  Filled 2012-02-29: qty 2

## 2012-02-29 NOTE — Discharge Instructions (Signed)

## 2012-02-29 NOTE — ED Provider Notes (Signed)
Chief Complaint  Patient presents with  . Headache    History of Present Illness:   The patient is a 66 year old female who has had a two-day history of bifrontal, throbbing pressure rated 9/10 in intensity associated with nausea and she vomited once. She has photophobia but no photophobia. The symptoms are better with rest and worse with activity. She's had some watering of her left eye, nasal congestion, and sneezing. She had the same thing about 2 or 3 months ago and her symptoms went away on their own after a day. She denies any visual symptoms, visual aura, numbness, tingling, weakness, trouble with speech or ambulation, stiff neck, fever, or chills. She has diabetes which has not really well controlled, her most recent blood sugar fasting was 171. She also has high blood pressure. She has no definite history of migraine headaches in the past.  Review of Systems:  Other than noted above, the patient denies any of the following symptoms: Systemic:  No fever, chills, fatigue, photophobia, stiff neck. Eye:  No redness, eye pain, discharge, blurred vision, or diplopia. ENT:  No nasal congestion, rhinorrhea, sinus pressure or pain, sneezing, earache, or sore throat.  No jaw claudication. Neuro:  No paresthesias, loss of consciousness, seizure activity, muscle weakness, trouble with coordination or gait, trouble speaking or swallowing. Psych:  No depression, anxiety or trouble sleeping.  PMFSH:  Past medical history, family history, social history, meds, and allergies were reviewed.  Physical Exam:   Vital signs:  BP 162/55  Pulse 94  Temp(Src) 98.1 F (36.7 C) (Oral)  Resp 20  SpO2 98% General:  Alert and oriented.  In no distress. Eye:  Lids and conjunctivas normal.  PERRL,  Full EOMs.  Fundi benign with normal discs and vessels. ENT:  No cranial or facial tenderness to palpation.  TMs and canals clear.  Nasal mucosa was normal and uncongested without any drainage. No intra oral lesions,  pharynx clear, mucous membranes moist, dentition normal. Neck:  Supple, full ROM, no tenderness to palpation.  No adenopathy or mass. Neuro:  Alert and orented times 3.  Speech was clear, fluent, and appropriate.  Cranial nerves intact. No pronator drift, muscle strength normal. Finger to nose normal.  DTRs were 2+ and symmetrical.Station and gait were normal.  Romberg's sign was normal.  Able to perform tandem gait well. Psych:  Normal affect.  Medications given in UCC:  She was given Tylenol 650 mg by mouth and tolerated this well without any immediate side effects.  Assessment:  The encounter diagnosis was Migraine without aura.  Plan:   1.  The following meds were prescribed:   New Prescriptions   ONDANSETRON (ZOFRAN ODT) 8 MG DISINTEGRATING TABLET    Take 1 tablet (8 mg total) by mouth every 8 (eight) hours as needed for nausea.   SUMATRIPTAN (IMITREX) 50 MG TABLET    Take 1 tablet (50 mg total) by mouth every 2 (two) hours as needed for migraine.   2.  The patient was instructed in symptomatic care and handouts were given. 3.  The patient was told to return if becoming worse in any way, if no better in 3 or 4 days, and given some red flag symptoms that would indicate earlier return.  Follow up:  The patient was told to follow up with Dr. Denyse Amass or Dr. Rubye Beach for migraine headaches.     Reuben Likes, MD 02/29/12 2124

## 2012-02-29 NOTE — ED Notes (Signed)
States she has been having HA and nausea for past 2 days; c/o seeing 'water spots' in front of her eyes that come and go;  was reportedly instructed to call 911 by her clinic nurse line to go to the ED, but drove herself here instead; feels best in cool dark, quiet, room

## 2012-04-04 ENCOUNTER — Other Ambulatory Visit: Payer: Self-pay | Admitting: Family Medicine

## 2012-04-04 DIAGNOSIS — E119 Type 2 diabetes mellitus without complications: Secondary | ICD-10-CM

## 2012-04-04 MED ORDER — INSULIN LISPRO PROT & LISPRO (75-25 MIX) 100 UNIT/ML ~~LOC~~ SUSP: 65.0000 [IU] | Freq: Two times a day (BID) | SUBCUTANEOUS | Status: DC

## 2012-04-23 ENCOUNTER — Ambulatory Visit: Payer: Self-pay | Admitting: Obstetrics and Gynecology

## 2012-05-28 ENCOUNTER — Encounter: Payer: Self-pay | Admitting: Obstetrics and Gynecology

## 2012-05-28 ENCOUNTER — Ambulatory Visit (INDEPENDENT_AMBULATORY_CARE_PROVIDER_SITE_OTHER): Payer: BC Managed Care – PPO | Admitting: Obstetrics and Gynecology

## 2012-05-28 VITALS — BP 122/62 | Resp 16 | Ht 65.0 in | Wt 215.0 lb

## 2012-05-28 DIAGNOSIS — R82998 Other abnormal findings in urine: Secondary | ICD-10-CM

## 2012-05-28 DIAGNOSIS — Z01419 Encounter for gynecological examination (general) (routine) without abnormal findings: Secondary | ICD-10-CM

## 2012-05-28 DIAGNOSIS — R8290 Unspecified abnormal findings in urine: Secondary | ICD-10-CM

## 2012-05-28 LAB — POCT URINALYSIS DIPSTICK
Bilirubin, UA: NEGATIVE
Ketones, UA: NEGATIVE
Leukocytes, UA: NEGATIVE
Protein, UA: NEGATIVE
Spec Grav, UA: <=1.005
pH, UA: 6

## 2012-05-28 NOTE — Progress Notes (Signed)
Regular Periods: no Mammogram: yes  Monthly Breast Ex.: no Exercise: yes  Tetanus < 10 years: yes Seatbelts: yes  NI. Bladder Functn.: yes Abuse at home: no  Daily BM's: no Stressful Work: no  Healthy Diet: no Sigmoid-Colonoscopy: "2011" "WNL"  Calcium: no pt states she is suppose to be on Vit D 5000mg . But does not take it reg. Medical problems this year: N/A    LAST PAP: 01/2009 "WNL"   Contraception: HYST   Mammogram:  10/2011 "WNL"  PCP: Kellie Shro[facility]"  PMH: No Changes  FMH: No Changes  Last Bone Scan: "2010"  "WNL" Subjective:    Jamie Burke is a 65 y.o. female G6P5 who presents for annual exam. The patient complains of difficulty controlling her blood sugars and her weight.  She has a history of hypertension, diabetes, and hyperlipidemia.  She exercises regularly.  She has trouble following her diet. She is status post hysterectomy.  The following portions of the patient's history were reviewed and updated as appropriate: allergies, current medications, past family history, past medical history, past social history, past surgical history and problem list. See above.  Review of Systems Pertinent items are noted in HPI. Gastrointestinal:No change in bowel habits, no abdominal pain, no rectal bleeding Genitourinary:negative for dysuria, frequency, hematuria, nocturia and urinary incontinence    Objective:     BP 122/62  Resp 16  Ht 5\' 5"  (1.651 m)  Wt 215 lb (97.523 kg)  BMI 35.78 kg/m2  Weight:  Wt Readings from Last 1 Encounters:  05/28/12 215 lb (97.523 kg)     BMI: Body mass index is 35.78 kg/(m^2). General Appearance: Alert, appropriate appearance for age. No acute distress HEENT: Grossly normal Neck / Thyroid: Supple, no masses, nodes or enlargement Lungs: clear to auscultation bilaterally Back: No CVA tenderness Breast Exam: No masses or nodes.No dimpling, nipple retraction or discharge. Cardiovascular: Regular rate and rhythm. S1, S2, no  murmur Gastrointestinal: Soft, non-tender, no masses or organomegaly  ++++++++++++++++++++++++++++++++++++++++++++++++++++++++  Pelvic Exam: External genitalia: normal general appearance Vaginal: atrophic mucosa Cervix: absent Adnexa: normal bimanual exam Uterus: absent Rectovaginal: normal rectal, no masses  ++++++++++++++++++++++++++++++++++++++++++++++++++++++++  Lymphatic Exam: Non-palpable nodes in neck, clavicular, axillary, or inguinal regions  Psychiatric: Alert and oriented, appropriate affect.    Urinalysis:Not done      Assessment:    Normal gyn exam   Overweight or obese: Yes  Pelvic relaxation: Yes  Menopausal symptoms: Yes. Severe: No.   Plan:    refer the patient back to her primary physician to manage her weight and her blood sugars.   Follow-up:  for annual exam  STD screen request: none,  RPR: No,  HBsAg: No.  Hepatitis C: No.  The updated Pap smear screening guidelines were discussed with the patient. The patient requested that I obtain a Pap smear: No.  Kegel exercises discussed: Yes.  Proper diet and regular exercise were reviewed.  Annual mammograms recommended starting at age 52. Proper breast care was discussed.  Screening colonoscopy is recommended beginning at age 50.  Regular health maintenance was reviewed.  Sleep hygiene was discussed.  Mylinda Latina.D.

## 2012-05-30 ENCOUNTER — Telehealth: Payer: Self-pay

## 2012-05-30 NOTE — Telephone Encounter (Signed)
Per AVS pt needed a bone scan scheduled. I called pt this morning at 10:30am Pt stated her PCP was suppose to be working on getting this done but has not called her back. I told the pt to check w/ her PCP Dr.Shelton  first since that office told her they would get that done for her.  Pt has [employer] Pt will call the office back to confirm info about bone scan.   [facility] CMA

## 2012-06-01 ENCOUNTER — Telehealth: Payer: Self-pay | Admitting: Obstetrics and Gynecology

## 2012-06-01 NOTE — Telephone Encounter (Signed)
TC to pt. States had urinary odor last week.  Drank cranberry juice and is better. Informed urine was neg 05/28/12. Feels weak and feels like her whole body has an odor.  Has called PCP and awaiting call back.  Advised to F/U with medical MD.  Pt agreeable.

## 2012-06-08 ENCOUNTER — Other Ambulatory Visit: Payer: BC Managed Care – PPO

## 2012-08-20 ENCOUNTER — Other Ambulatory Visit: Payer: BC Managed Care – PPO

## 2012-09-03 ENCOUNTER — Ambulatory Visit (INDEPENDENT_AMBULATORY_CARE_PROVIDER_SITE_OTHER): Payer: BC Managed Care – PPO

## 2012-09-03 ENCOUNTER — Other Ambulatory Visit: Payer: BC Managed Care – PPO

## 2012-09-03 ENCOUNTER — Other Ambulatory Visit: Payer: Self-pay | Admitting: Obstetrics and Gynecology

## 2012-09-03 DIAGNOSIS — Z13820 Encounter for screening for osteoporosis: Secondary | ICD-10-CM

## 2013-01-04 ENCOUNTER — Other Ambulatory Visit: Payer: Self-pay | Admitting: Obstetrics and Gynecology

## 2013-01-04 DIAGNOSIS — Z1231 Encounter for screening mammogram for malignant neoplasm of breast: Secondary | ICD-10-CM

## 2013-01-17 ENCOUNTER — Ambulatory Visit (HOSPITAL_COMMUNITY): Payer: BC Managed Care – PPO

## 2013-03-11 ENCOUNTER — Encounter (HOSPITAL_COMMUNITY): Payer: Self-pay | Admitting: *Deleted

## 2013-03-11 ENCOUNTER — Emergency Department (HOSPITAL_COMMUNITY): Payer: Medicare Other

## 2013-03-11 ENCOUNTER — Emergency Department (HOSPITAL_COMMUNITY)
Admission: EM | Admit: 2013-03-11 | Discharge: 2013-03-11 | Disposition: A | Discharge status: Home Health | Payer: Medicare Other | Attending: Emergency Medicine | Admitting: Emergency Medicine

## 2013-03-11 DIAGNOSIS — Z872 Personal history of diseases of the skin and subcutaneous tissue: Secondary | ICD-10-CM | POA: Insufficient documentation

## 2013-03-11 DIAGNOSIS — R1110 Vomiting, unspecified: Secondary | ICD-10-CM | POA: Insufficient documentation

## 2013-03-11 DIAGNOSIS — Z8719 Personal history of other diseases of the digestive system: Secondary | ICD-10-CM | POA: Insufficient documentation

## 2013-03-11 DIAGNOSIS — Z794 Long term (current) use of insulin: Secondary | ICD-10-CM | POA: Insufficient documentation

## 2013-03-11 DIAGNOSIS — E119 Type 2 diabetes mellitus without complications: Secondary | ICD-10-CM | POA: Insufficient documentation

## 2013-03-11 DIAGNOSIS — Z90710 Acquired absence of both cervix and uterus: Secondary | ICD-10-CM | POA: Insufficient documentation

## 2013-03-11 DIAGNOSIS — Z87891 Personal history of nicotine dependence: Secondary | ICD-10-CM | POA: Insufficient documentation

## 2013-03-11 DIAGNOSIS — K2970 Gastritis, unspecified, without bleeding: Secondary | ICD-10-CM | POA: Insufficient documentation

## 2013-03-11 DIAGNOSIS — Z85820 Personal history of malignant melanoma of skin: Secondary | ICD-10-CM | POA: Insufficient documentation

## 2013-03-11 DIAGNOSIS — I10 Essential (primary) hypertension: Secondary | ICD-10-CM | POA: Insufficient documentation

## 2013-03-11 DIAGNOSIS — Z79899 Other long term (current) drug therapy: Secondary | ICD-10-CM | POA: Insufficient documentation

## 2013-03-11 LAB — POCT I-STAT TROPONIN I

## 2013-03-11 LAB — CBC WITH DIFFERENTIAL/PLATELET
Eosinophils Relative: 1 % (ref 0–5)
HCT: 36.1 % (ref 36.0–46.0)
Hemoglobin: 12.1 g/dL (ref 12.0–15.0)
Lymphocytes Relative: 27 % (ref 12–46)
MCV: 81.1 fL (ref 78.0–100.0)
Monocytes Absolute: 1.2 10*3/uL — ABNORMAL HIGH (ref 0.1–1.0)
Monocytes Relative: 8 % (ref 3–12)
Neutro Abs: 9.5 10*3/uL — ABNORMAL HIGH (ref 1.7–7.7)
WBC: 14.9 10*3/uL — ABNORMAL HIGH (ref 4.0–10.5)

## 2013-03-11 LAB — COMPREHENSIVE METABOLIC PANEL
BUN: 9 mg/dL (ref 6–23)
CO2: 25 mEq/L (ref 19–32)
Calcium: 9.8 mg/dL (ref 8.4–10.5)
Chloride: 100 mEq/L (ref 96–112)
Creatinine, Ser: 0.66 mg/dL (ref 0.50–1.10)
GFR calc Af Amer: >90 mL/min (ref >90)
GFR calc non Af Amer: >90 mL/min (ref >90)
Glucose, Bld: 210 mg/dL — ABNORMAL HIGH (ref 70–99)
Total Bilirubin: 0.3 mg/dL (ref 0.3–1.2)

## 2013-03-11 LAB — GLUCOSE, CAPILLARY: Glucose-Capillary: 189 mg/dL — ABNORMAL HIGH (ref 70–99)

## 2013-03-11 LAB — LIPASE, BLOOD: Lipase: 13 U/L (ref 11–59)

## 2013-03-11 MED ORDER — ONDANSETRON 4 MG PO TBDP
8.0000 mg | ORAL_TABLET | Freq: Once | ORAL | Status: AC
  Administered 2013-03-11: 8 mg via ORAL
  Filled 2013-03-11: qty 2

## 2013-03-11 MED ORDER — SUCRALFATE 1 G PO TABS: 1.0000 g | ORAL_TABLET | Freq: Four times a day (QID) | ORAL | Status: DC

## 2013-03-11 MED ORDER — ONDANSETRON 8 MG PO TBDP: 8.0000 mg | ORAL_TABLET | Freq: Three times a day (TID) | ORAL | Status: DC | PRN

## 2013-03-11 NOTE — ED Provider Notes (Signed)
History     CSN: [id number]  Arrival date & time 03/11/13  1931   First MD Initiated Contact with Patient 03/11/13 2110      Chief Complaint  Patient presents with  . Abdominal Pain    (Consider location/radiation/quality/duration/timing/severity/associated sxs/prior treatment) Patient is a 67 y.o. female presenting with abdominal pain. The history is provided by the patient and a relative.  Abdominal Pain  patient here complaining of epigastric abdominal pain which began today prior to arrival. She is noted nonbilious vomiting without fever or diarrhea. Has a history of gastroparesis and is a diabetic however the symptoms are different. Patient given Zofran here and feels much better. Denies any anginal type chest pain. No prior history of same. Nothing seemed to make her symptoms worse. Patient denies any urinary symptoms.  Past Medical History  Diagnosis Date  . Diabetes mellitus   . Hypertension   . Dysplastic nevi   . Gastroparesis   . GERD (gastroesophageal reflux disease)   . Melanoma     Past Surgical History  Procedure Laterality Date  . Abdominal hysterectomy    . Carpal tunnel release      BL  . Trigger finger release      Family History  Problem Relation Age of Onset  . Diabetes Mother   . Heart disease Mother   . Hypertension Mother   . Stroke Mother   . Hypertension Father   . Stroke Father   . Cancer Brother 54    Lung Cancer  . Kidney disease Brother     History  Substance Use Topics  . Smoking status: Former Smoker -- 2 years  . Smokeless tobacco: Never Used  . Alcohol Use: No    OB History   Grav Para Term Preterm Abortions TAB SAB Ect Mult Living   6 5              Review of Systems  Gastrointestinal: Positive for abdominal pain.  All other systems reviewed and are negative.    Allergies  Sulfa antibiotics  Home Medications   Current Outpatient Rx  Name  Route  Sig  Dispense  Refill  . ACCU-CHEK AVIVA PLUS test strip                . insulin lispro protamine-insulin lispro (HUMALOG 75/25) (75-25) 100 UNIT/ML SUSP   Subcutaneous   Inject 65 Units into the skin 2 (two) times daily with a meal. Will need visit for further refills.   10 mL   0   . EXPIRED: lisinopril-hydrochlorothiazide (PRINZIDE,ZESTORETIC) 20-25 MG per tablet   Oral   Take 1 tablet by mouth daily.   30 tablet   11   . lisinopril-hydrochlorothiazide (PRINZIDE,ZESTORETIC) 20-25 MG per tablet   Oral   Take 1 tablet by mouth daily.         [name] EXPIRED: metFORMIN (GLUCOPHAGE) 500 MG tablet   Oral   Take 1 tablet (500 mg total) by mouth 2 (two) times daily with a meal.   60 tablet   2   . EXPIRED: pravastatin (PRAVACHOL) 40 MG tablet   Oral   Take 1 tablet (40 mg total) by mouth at bedtime.   90 tablet   2   . EXPIRED: SUMAtriptan (IMITREX) 50 MG tablet   Oral   Take 1 tablet (50 mg total) by mouth every 2 (two) hours as needed for migraine.   10 tablet   0     BP 190/91  Pulse  89  Resp 22  SpO2 0%  Physical Exam  Nursing note and vitals reviewed. Constitutional: She is oriented to person, place, and time. She appears well-developed and well-nourished.  Non-toxic appearance. No distress.  HENT:  Head: Normocephalic and atraumatic.  Eyes: Conjunctivae, EOM and lids are normal. Pupils are equal, round, and reactive to light.  Neck: Normal range of motion. Neck supple. No tracheal deviation present. No mass present.  Cardiovascular: Normal rate, regular rhythm and normal heart sounds.  Exam reveals no gallop.   No murmur heard. Pulmonary/Chest: Effort normal and breath sounds normal. No stridor. No respiratory distress. She has no decreased breath sounds. She has no wheezes. She has no rhonchi. She has no rales.  Abdominal: Soft. Normal appearance and bowel sounds are normal. She exhibits no distension. There is tenderness in the epigastric area. There is no rigidity, no rebound, no guarding and no CVA tenderness.   Musculoskeletal: Normal range of motion. She exhibits no edema and no tenderness.  Neurological: She is alert and oriented to person, place, and time. She has normal strength. No cranial nerve deficit or sensory deficit. GCS eye subscore is 4. GCS verbal subscore is 5. GCS motor subscore is 6.  Skin: Skin is warm and dry. No abrasion and no rash noted.  Psychiatric: She has a normal mood and affect. Her speech is normal and behavior is normal.    ED Course  Procedures (including critical care time)  Labs Reviewed  CBC WITH DIFFERENTIAL - Abnormal; Notable for the following:    WBC 14.9 (*)    Neutro Abs 9.5 (*)    Monocytes Absolute 1.2 (*)    All other components within normal limits  GLUCOSE, CAPILLARY - Abnormal; Notable for the following:    Glucose-Capillary 189 (*)    All other components within normal limits  COMPREHENSIVE METABOLIC PANEL  LIPASE, BLOOD  URINALYSIS, ROUTINE W REFLEX MICROSCOPIC   No results found.   No diagnosis found.    MDM   Patient given medications here and the vomiting has stopped. She does note that she is on prednisone at this time I think that this may be a cause of her symptoms. Patient to be treated for gastritis and discharged home       Toy Baker, MD 03/11/13 2252

## 2013-03-11 NOTE — ED Notes (Signed)
Throwing up for 30 minutes. Ate a pack of crackers ~ 1830.  cbg 133.  No fevers, chills. Feeling weak.

## 2013-03-11 NOTE — ED Notes (Signed)
New and old EKG given to Dr. Freida Busman. Copy placed in pt chart.

## 2013-03-29 ENCOUNTER — Ambulatory Visit: Payer: Medicare Other | Admitting: Family

## 2013-04-12 ENCOUNTER — Ambulatory Visit (HOSPITAL_COMMUNITY)
Admission: RE | Admit: 2013-04-12 | Discharge: 2013-04-12 | Disposition: A | Discharge status: Home / Self Care | Payer: Medicare Other | Source: Ambulatory Visit | Attending: Obstetrics and Gynecology | Admitting: Obstetrics and Gynecology

## 2013-04-12 DIAGNOSIS — Z1231 Encounter for screening mammogram for malignant neoplasm of breast: Secondary | ICD-10-CM | POA: Insufficient documentation

## 2013-11-04 ENCOUNTER — Ambulatory Visit: Payer: Medicare Other | Admitting: Physician Assistant

## 2013-11-05 ENCOUNTER — Ambulatory Visit: Payer: Medicare Other | Admitting: Physician Assistant

## 2014-03-10 ENCOUNTER — Other Ambulatory Visit: Payer: Self-pay | Admitting: Obstetrics and Gynecology

## 2014-03-10 DIAGNOSIS — Z1231 Encounter for screening mammogram for malignant neoplasm of breast: Secondary | ICD-10-CM

## 2014-03-21 ENCOUNTER — Ambulatory Visit: Payer: Self-pay | Admitting: Podiatrist

## 2014-03-24 ENCOUNTER — Ambulatory Visit (INDEPENDENT_AMBULATORY_CARE_PROVIDER_SITE_OTHER): Payer: Medicare Other | Admitting: Podiatry

## 2014-03-24 ENCOUNTER — Ambulatory Visit (INDEPENDENT_AMBULATORY_CARE_PROVIDER_SITE_OTHER): Payer: Medicare Other

## 2014-03-24 ENCOUNTER — Encounter: Payer: Self-pay | Admitting: Podiatry

## 2014-03-24 VITALS — BP 143/73 | HR 86 | Resp 14 | Ht 64.5 in | Wt 205.0 lb

## 2014-03-24 DIAGNOSIS — S93609A Unspecified sprain of unspecified foot, initial encounter: Secondary | ICD-10-CM

## 2014-03-24 DIAGNOSIS — M79609 Pain in unspecified limb: Secondary | ICD-10-CM

## 2014-03-24 DIAGNOSIS — M79606 Pain in leg, unspecified: Secondary | ICD-10-CM

## 2014-03-24 DIAGNOSIS — M722 Plantar fascial fibromatosis: Secondary | ICD-10-CM

## 2014-03-24 NOTE — Progress Notes (Signed)
   Subjective:    Patient ID: CAMRYN LAMPSON, female    DOB: 1946-01-01, 68 y.o.   MRN: [id number]  HPI Comments: N foot pain L entire B/L feet D 1 month or greater O after being tried on Vytoza for diabetes C stinging pain occasionally, soreness and stiffness A worse in the morning - stiffness T Advil  Left 1st toenail medial border began 2 weeks ago.  The 1st left toenail is encurvated.  Foot Pain      Review of Systems  Allergic/Immunologic: Positive for environmental allergies.  All other systems reviewed and are negative.      Objective:   Physical Exam  Orientated x3 space black female  Vascular: DP and PT pulses 2/4 bilaterally Capillary fill is immediate bilaterally  Neurological: Sensation to 10 g monofilament wire intact 5/5 bilaterally Vibratory sensation intact bilaterally Ankle reflexes reactive bilaterally  Dermatological: Texture turgor within normal limits bilaterally  Musculoskeletal: HAV deformities bilaterally Hammertoe deformity second digits bilaterally Mild palpable tenderness plantar mid arch right without a palpable lesions Mild palpable tenderness dorsal aspect left foot without a palpable lesions  x-ray report weightbearing right foot  Intact bony structures done a fracture and/or dislocation noted  HAV deformity right  Hammertoe deformity second  Inferior calcaneal spur  Radiographic impression:  No acute bony abnormality noted in the right foot   X-ray report weightbearing left foot  Intact bony structure without fracture or dislocation noted  HAV deformity  Hammer second toe  Inferior calcaneal spur  Radiographic impression:  No acute bony abnormality noted left foot     Assessment & Plan:   Assessment: Satisfactory neurovascular status bilaterally Plantar fasciitis right Sprain left foot  Plan: Patient is advised to wear a relatively new lace up supportive shoes at all times.  Use an oral on an as-needed  basis  Reappoint x4 weeks

## 2014-03-24 NOTE — Patient Instructions (Signed)
Foot Sprain The muscles and cord like structures which attach muscle to bone (tendons) that surround the feet are made up of units. A foot sprain can occur at the weakest spot in any of these units. This condition is most often caused by injury to or overuse of the foot, as from playing contact sports, or aggravating a previous injury, or from poor conditioning, or obesity. SYMPTOMS  Pain with movement of the foot.  Tenderness and swelling at the injury site.  Loss of strength is present in moderate or severe sprains. THE THREE GRADES OR SEVERITY OF FOOT SPRAIN ARE:  Mild (Grade I): Slightly pulled muscle without tearing of muscle or tendon fibers or loss of strength.  Moderate (Grade II): Tearing of fibers in a muscle, tendon, or at the attachment to bone, with small decrease in strength.  Severe (Grade III): Rupture of the muscle-tendon-bone attachment, with separation of fibers. Severe sprain requires surgical repair. Often repeating (chronic) sprains are caused by overuse. Sudden (acute) sprains are caused by direct injury or over-use. DIAGNOSIS  Diagnosis of this condition is usually by your own observation. If problems continue, a caregiver may be required for further evaluation and treatment. X-rays may be required to make sure there are not breaks in the bones (fractures) present. Continued problems may require physical therapy for treatment. PREVENTION  Use strength and conditioning exercises appropriate for your sport.  Warm up properly prior to working out.  Use athletic shoes that are made for the sport you are participating in.  Allow adequate time for healing. Early return to activities makes repeat injury more likely, and can lead to an unstable arthritic foot that can result in prolonged disability. Mild sprains generally heal in 3 to 10 days, with moderate and severe sprains taking 2 to 10 weeks. Your caregiver can help you determine the proper time required for  healing. HOME CARE INSTRUCTIONS   Apply ice to the injury for 15-20 minutes, 03-04 times per day. Put the ice in a plastic bag and place a towel between the bag of ice and your skin.  An elastic wrap (like an Ace bandage) may be used to keep swelling down.  Keep foot above the level of the heart, or at least raised on a footstool, when swelling and pain are present.  Try to avoid use other than gentle range of motion while the foot is painful. Do not resume use until instructed by your caregiver. Then begin use gradually, not increasing use to the point of pain. If pain does develop, decrease use and continue the above measures, gradually increasing activities that do not cause discomfort, until you gradually achieve normal use.  Use crutches if and as instructed, and for the length of time instructed.  Keep injured foot and ankle wrapped between treatments.  Massage foot and ankle for comfort and to keep swelling down. Massage from the toes up towards the knee.  Only take over-the-counter or prescription medicines for pain, discomfort, or fever as directed by your caregiver. SEEK IMMEDIATE MEDICAL CARE IF:   Your pain and swelling increase, or pain is not controlled with medications.  You have loss of feeling in your foot or your foot turns cold or blue.  You develop new, unexplained symptoms, or an increase of the symptoms that brought you to your caregiver. MAKE SURE YOU:   Understand these instructions.  Will watch your condition.  Will get help right away if you are not doing well or get worse. Document Released:   04/01/2002 Document Revised: 01/02/2012 Document Reviewed: 05/29/2008 [facility] Patient Information 2014 [place]. Plantar Fasciitis Plantar fasciitis is a common condition that causes foot pain. It is soreness (inflammation) of the band of tough fibrous tissue on the bottom of the foot that runs from the heel bone (calcaneus) to the ball of the foot. The cause  of this soreness may be from excessive standing, poor fitting shoes, running on hard surfaces, being overweight, having an abnormal walk, or overuse (this is common in runners) of the painful foot or feet. It is also common in aerobic exercise dancers and ballet dancers. SYMPTOMS  Most people with plantar fasciitis complain of:  Severe pain in the morning on the bottom of their foot especially when taking the first steps out of bed. This pain recedes after a few minutes of walking.  Severe pain is experienced also during walking following a long period of inactivity.  Pain is worse when walking barefoot or up stairs DIAGNOSIS   Your caregiver will diagnose this condition by examining and feeling your foot.  Special tests such as X-rays of your foot, are usually not needed. PREVENTION   Consult a sports medicine professional before beginning a new exercise program.  Walking programs offer a good workout. With walking there is a lower chance of overuse injuries common to runners. There is less impact and less jarring of the joints.  Begin all new exercise programs slowly. If problems or pain develop, decrease the amount of time or distance until you are at a comfortable level.  Wear good shoes and replace them regularly.  Stretch your foot and the heel cords at the back of the ankle (Achilles tendon) both before and after exercise.  Run or exercise on even surfaces that are not hard. For example, asphalt is better than pavement.  Do not run barefoot on hard surfaces.  If using a treadmill, vary the incline.  Do not continue to workout if you have foot or joint problems. Seek professional help if they do not improve. HOME CARE INSTRUCTIONS   Avoid activities that cause you pain until you recover.  Use ice or cold packs on the problem or painful areas after working out.  Only take over-the-counter or prescription medicines for pain, discomfort, or fever as directed by your  caregiver.  Soft shoe inserts or athletic shoes with air or gel sole cushions may be helpful.  If problems continue or become more severe, consult a sports medicine caregiver or your own health care provider. Cortisone is a potent anti-inflammatory medication that may be injected into the painful area. You can discuss this treatment with your caregiver. MAKE SURE YOU:   Understand these instructions.  Will watch your condition.  Will get help right away if you are not doing well or get worse. Document Released: 07/05/2001 Document Revised: 01/02/2012 Document Reviewed: 09/03/2008 [facility] Patient Information 2014 [place], [place].

## 2014-04-07 ENCOUNTER — Ambulatory Visit: Payer: Self-pay | Admitting: Podiatry

## 2014-04-11 ENCOUNTER — Ambulatory Visit (HOSPITAL_COMMUNITY)
Admission: RE | Admit: 2014-04-11 | Discharge: 2014-04-11 | Disposition: A | Discharge status: Home / Self Care | Payer: Medicare Other | Source: Ambulatory Visit | Attending: Obstetrics and Gynecology | Admitting: Obstetrics and Gynecology

## 2014-04-11 ENCOUNTER — Other Ambulatory Visit (HOSPITAL_COMMUNITY): Payer: Self-pay | Admitting: Obstetrics and Gynecology

## 2014-04-11 DIAGNOSIS — Z1231 Encounter for screening mammogram for malignant neoplasm of breast: Secondary | ICD-10-CM

## 2014-04-18 ENCOUNTER — Ambulatory Visit (HOSPITAL_COMMUNITY): Payer: Medicare Other

## 2014-04-21 ENCOUNTER — Ambulatory Visit: Payer: Medicare Other | Admitting: Podiatry

## 2014-05-12 ENCOUNTER — Ambulatory Visit (HOSPITAL_COMMUNITY)
Admission: RE | Admit: 2014-05-12 | Discharge: 2014-05-12 | Disposition: A | Discharge status: Home / Self Care | Payer: Medicare Other | Source: Ambulatory Visit | Attending: Obstetrics and Gynecology | Admitting: Obstetrics and Gynecology

## 2014-05-12 DIAGNOSIS — Z1231 Encounter for screening mammogram for malignant neoplasm of breast: Secondary | ICD-10-CM | POA: Insufficient documentation

## 2014-08-25 ENCOUNTER — Encounter: Payer: Self-pay | Admitting: Podiatry

## 2014-09-03 ENCOUNTER — Encounter (HOSPITAL_COMMUNITY): Payer: Self-pay | Admitting: *Deleted

## 2014-09-03 ENCOUNTER — Emergency Department (HOSPITAL_COMMUNITY)
Admission: EM | Admit: 2014-09-03 | Discharge: 2014-09-03 | Disposition: A | Discharge status: Home / Self Care | Payer: No Typology Code available for payment source | Attending: Emergency Medicine | Admitting: Emergency Medicine

## 2014-09-03 DIAGNOSIS — E119 Type 2 diabetes mellitus without complications: Secondary | ICD-10-CM | POA: Insufficient documentation

## 2014-09-03 DIAGNOSIS — R0981 Nasal congestion: Secondary | ICD-10-CM | POA: Diagnosis not present

## 2014-09-03 DIAGNOSIS — Z794 Long term (current) use of insulin: Secondary | ICD-10-CM | POA: Diagnosis not present

## 2014-09-03 DIAGNOSIS — Y9389 Activity, other specified: Secondary | ICD-10-CM | POA: Diagnosis not present

## 2014-09-03 DIAGNOSIS — Z87891 Personal history of nicotine dependence: Secondary | ICD-10-CM | POA: Diagnosis not present

## 2014-09-03 DIAGNOSIS — Z872 Personal history of diseases of the skin and subcutaneous tissue: Secondary | ICD-10-CM | POA: Insufficient documentation

## 2014-09-03 DIAGNOSIS — Y998 Other external cause status: Secondary | ICD-10-CM | POA: Diagnosis not present

## 2014-09-03 DIAGNOSIS — S39012A Strain of muscle, fascia and tendon of lower back, initial encounter: Secondary | ICD-10-CM

## 2014-09-03 DIAGNOSIS — Z85820 Personal history of malignant melanoma of skin: Secondary | ICD-10-CM | POA: Insufficient documentation

## 2014-09-03 DIAGNOSIS — Z8719 Personal history of other diseases of the digestive system: Secondary | ICD-10-CM | POA: Diagnosis not present

## 2014-09-03 DIAGNOSIS — Y92410 Unspecified street and highway as the place of occurrence of the external cause: Secondary | ICD-10-CM | POA: Insufficient documentation

## 2014-09-03 DIAGNOSIS — I10 Essential (primary) hypertension: Secondary | ICD-10-CM | POA: Diagnosis not present

## 2014-09-03 DIAGNOSIS — Z79899 Other long term (current) drug therapy: Secondary | ICD-10-CM | POA: Diagnosis not present

## 2014-09-03 DIAGNOSIS — S3992XA Unspecified injury of lower back, initial encounter: Secondary | ICD-10-CM | POA: Diagnosis present

## 2014-09-03 MED ORDER — METHOCARBAMOL 500 MG PO TABS: 500.0000 mg | ORAL_TABLET | Freq: Two times a day (BID) | ORAL | Status: DC
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
## 2016-01-13 DIAGNOSIS — H903 Sensorineural hearing loss, bilateral: Secondary | ICD-10-CM | POA: Insufficient documentation

## 2016-01-13 DIAGNOSIS — H9312 Tinnitus, left ear: Secondary | ICD-10-CM | POA: Insufficient documentation

## 2016-03-04 ENCOUNTER — Other Ambulatory Visit: Payer: Self-pay | Admitting: Orthopaedic Surgery

## 2016-03-04 DIAGNOSIS — M542 Cervicalgia: Secondary | ICD-10-CM

## 2016-03-12 ENCOUNTER — Ambulatory Visit
Admission: RE | Admit: 2016-03-12 | Discharge: 2016-03-12 | Disposition: A | Discharge status: Home / Self Care | Payer: Medicare Other | Source: Ambulatory Visit | Attending: Orthopaedic Surgery | Admitting: Orthopaedic Surgery

## 2016-03-12 DIAGNOSIS — M542 Cervicalgia: Secondary | ICD-10-CM

## 2016-04-05 ENCOUNTER — Encounter: Payer: Self-pay | Admitting: Dietician

## 2016-04-05 ENCOUNTER — Encounter: Payer: Medicare Other | Attending: Internal Medicine | Admitting: Dietician

## 2016-04-05 ENCOUNTER — Ambulatory Visit: Payer: Medicare Other | Attending: Orthopaedic Surgery

## 2016-04-05 VITALS — Ht 64.5 in | Wt 223.2 lb

## 2016-04-05 DIAGNOSIS — E119 Type 2 diabetes mellitus without complications: Secondary | ICD-10-CM | POA: Diagnosis present

## 2016-04-05 DIAGNOSIS — Z794 Long term (current) use of insulin: Secondary | ICD-10-CM

## 2016-04-05 DIAGNOSIS — R252 Cramp and spasm: Secondary | ICD-10-CM | POA: Diagnosis present

## 2016-04-05 DIAGNOSIS — E1110 Type 2 diabetes mellitus with ketoacidosis without coma: Secondary | ICD-10-CM

## 2016-04-05 DIAGNOSIS — M542 Cervicalgia: Secondary | ICD-10-CM | POA: Diagnosis present

## 2016-04-05 DIAGNOSIS — R293 Abnormal posture: Secondary | ICD-10-CM | POA: Insufficient documentation

## 2016-04-05 NOTE — Patient Instructions (Addendum)
Posture Tips DO: - stand tall and erect - keep chin tucked in - keep head and shoulders in alignment - check posture regularly in mirror or large window - pull head back against headrest in car seat;  Change your position often.  Sit with lumbar support. DON'T: - slouch or slump while watching TV or reading - sit, stand or lie in one position  for too long;  Sitting is especially hard on the spine so if you sit at a desk/use the computer, then stand up often!   Copyright  VHI. All rights reserved.  Posture - Standing   Good posture is important. Avoid slouching and forward head thrust. Maintain curve in low back and align ears over shoul- ders, hips over ankles.  Pull your belly button in toward your back bone.   Copyright  VHI. All rights reserved.  Posture - Sitting   Sit upright, head facing forward. Try using a roll to support lower back. Keep shoulders relaxed, and avoid rounded back. Keep hips level with knees. Avoid crossing legs for long periods.   Copyright  VHI. All rights reserved.  From cabinet issued stretching exercises and core stability exercise Level 1 neck  for daily 2-3 sets 5-20 sec hold  Or 5-20 reps .

## 2016-04-05 NOTE — Patient Instructions (Addendum)
Goals:  Follow Diabetes Meal Plan as instructed  Eat 3 meals and 2 snacks, every 3-5 hrs  Limit carbohydrate intake to 30-45 grams carbohydrate/meal  Limit carbohydrate intake to 15 grams carbohydrate/snack  Add lean protein foods to meals/snacks (Protein shake, boiled eggs, cheese, peanut butter, deli meat)  Monitor glucose levels as instructed by your doctor  Aim for 30 mins of physical activity daily - start by walking at [facility] for 20 minutes for 3 x week  Get more glucose tablets  Check with Dr. Buddy Duty if you continue to have blood sugar above 200 after making diet changes

## 2016-04-05 NOTE — Therapy (Signed)
[place], [place], [address] Phone: [phone]   Fax:  [phone]  Physical Therapy Evaluation/Discharge  Patient Details  Name: Jamie Burke MRN: [id number] Date of Birth: [birth year] Referring Provider: Joni Fears, MD  Encounter Date: 04/05/2016      PT End of Session - 04/05/16 1322    Visit Number 1   Number of Visits 1   Authorization Type Medicare   PT Start Time 1230   PT Stop Time 1315   PT Time Calculation (min) 45 min   Activity Tolerance Patient tolerated treatment well   Behavior During Therapy [facility] for tasks assessed/performed      Past Medical History  Diagnosis Date  . Diabetes mellitus   . Hypertension   . Dysplastic nevi   . Gastroparesis   . GERD (gastroesophageal reflux disease)   . Melanoma     Past Surgical History  Procedure Laterality Date  . Abdominal hysterectomy    . Carpal tunnel release      BL  . Trigger finger release      There were no vitals filed for this visit.       Subjective Assessment - 04/05/16 1242    Subjective She reports a pinch in neck  started and began to get worse over past 7 weeks . Taking pain med s and muscle relaxor and is better. Pain worse at night. 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   Pertinent History She reports neck pain in past  on and off for years. She had some CTS surgery.     Limitations --  incr pain with LT rotation while driving   Diagnostic tests MRI:  OA, DDD    Currently in Pain? Yes   Pain Score 2    Pain Location Neck   Pain Orientation Proximal;Left;Lateral   Pain Descriptors / Indicators Dull   Pain Type Chronic pain   Pain Radiating Towards LT shoulder   Pain Onset More than a month ago   Pain Frequency Intermittent   Aggravating Factors  turning head, driving bus   Pain Relieving Factors medication   Multiple Pain Sites No            [facility]  PT Assessment - 04/05/16 0001    Assessment   Medical Diagnosis Neck pain    Referring Provider Joni Fears, MD   Onset Date/Surgical Date --  7 weeks ago   Hand Dominance Right   Next MD Visit As needed   Prior Therapy Yes but can't remember.    Precautions   Precautions None   Restrictions   Weight Bearing Restrictions No   Balance Screen   Has the patient fallen in the past 6 months No   Has the patient had a decrease in activity level because of a fear of falling?  No   Is the patient reluctant to leave their home because of a fear of falling?  No   Prior Function   Level of Independence Independent   Cognition   Overall Cognitive Status Within Functional Limits for tasks assessed   Posture/Postural Control   Posture Comments forward head rounded shoulders   ROM / Strength   AROM / PROM / Strength AROM;Strength   AROM   Overall AROM Comments decreased overhead reach 130 degrees RT and LT 125 degrees in scaption   AROM Assessment Site Cervical   Cervical Flexion 57   Cervical  Extension 40   Cervical - Right Side Bend 15   Cervical - Left Side Bend 12   Cervical - Right Rotation 60   Cervical - Left Rotation 50   Strength   Overall Strength Comments Normal strength                           PT Education - 04/05/16 1321    Education provided Yes   Education Details POC HEP Posture   Person(s) Educated Patient   Methods Explanation;Demonstration;Tactile cues;Verbal cues;Handout   Comprehension Returned demonstration;Verbalized understanding             PT Long Term Goals - 04/05/16 1324    PT LONG TERM GOAL #1   Title Independent with HEP and psoture awareness   Time 1   Period Days   Status Achieved               Plan - 04/05/16 1322    Clinical Impression Statement Ms Hanek presents with neck pain , spasm and stiffness for an low complexity evaluation. She did well with HEP and should improve over time.    Rehab Potential  Good   PT Frequency One time visit   PT Treatment/Interventions Patient/family education;Therapeutic exercise   PT Next Visit Plan Discharge today with HEP   PT Home Exercise Plan posture , stretching, stab exercise   Consulted and Agree with Plan of Care Patient      Patient will benefit from skilled therapeutic intervention in order to improve the following deficits and impairments:     Visit Diagnosis: Cervicalgia - Plan: PT plan of care cert/re-cert  Abnormal posture - Plan: PT plan of care cert/re-cert  Cramp and spasm - Plan: PT plan of care cert/re-cert      G-Codes - [id number]    Functional Assessment Tool Used climical judgement   Functional Limitation Changing and maintaining body position   Changing and Maintaining Body Position Current Status [id number]) At least 20 percent but less than 40 percent impaired, limited or restricted   Changing and Maintaining Body Position Goal Status [id number]) At least 20 percent but less than 40 percent impaired, limited or restricted   Changing and Maintaining Body Position Discharge Status [id number]) At least 20 percent but less than 40 percent impaired, limited or restricted       Problem List Patient Active Problem List   Diagnosis Date Noted  . Rotator cuff injury 05/23/2011  . Seasonal allergies 05/23/2011  . Lesion of nose 05/23/2011  . Hyperlipidemia LDL goal < 100 04/07/2011  . Obesity 04/07/2011  . HTN (hypertension) 03/15/2011  . Diabetes mellitus, type II, insulin dependent ([place]) 03/15/2011    Darrel Hoover   PT 04/05/2016, 1:27 PM  [facility] [address] [place], [place], [address] Phone: [phone]   Fax:  [phone]  Name: DARRIN LOBER MRN: [id number] Date of Birth: November 30, 1945

## 2016-04-05 NOTE — Progress Notes (Signed)
Diabetes Self-Management Education  Visit Type: First/Initial  Appt. Start Time: 210 Appt. End Time:310  04/05/2016  [name], identified by name and date of birth, is a 70 y.o. female with a diagnosis of Diabetes: Type 2.   ASSESSMENT  Height 5' 4.5" (1.638 m), weight 223 lb 3.2 oz (101.243 kg). Body mass index is 37.73 kg/(m^2).      Diabetes Self-Management Education - 04/05/16 1522    Pre-Education Assessment   Patient understands the diabetes disease and treatment process. Needs Instruction   Patient understands incorporating nutritional management into lifestyle. Needs Instruction   Patient undertands incorporating physical activity into lifestyle. Needs Instruction   Patient understands using medications safely. Demonstrates understanding / competency   Patient understands monitoring blood glucose, interpreting and using results Needs Review   Patient understands prevention, detection, and treatment of acute complications. Needs Review   Patient understands prevention, detection, and treatment of chronic complications. Needs Review   Patient understands how to develop strategies to address psychosocial issues. Needs Review   Patient understands how to develop strategies to promote health/change behavior. Needs Review   Patient Education   Previous Diabetes Education Yes (please comment)  long time ago   Disease state  Definition of diabetes, type 1 and 2, and the diagnosis of diabetes   Nutrition management  Carbohydrate counting;Food label reading, portion sizes and measuring food.;Reviewed blood glucose goals for pre and post meals and how to evaluate the patients' food intake on their blood glucose level.   Physical activity and exercise  Role of exercise on diabetes management, blood pressure control and cardiac health.;Identified with patient nutritional and/or medication changes necessary with exercise.   Acute complications Taught treatment of hypoglycemia -  the 15 rule.;Discussed and identified patients' treatment of hyperglycemia.   Chronic complications Relationship between chronic complications and blood glucose control;Dental care;Assessed and discussed foot care and prevention of foot problems   Psychosocial adjustment Role of stress on diabetes   Individualized Goals (developed by patient)   Nutrition Follow meal plan discussed   Physical Activity Exercise 3-5 times per week   Medications take my medication as prescribed   Monitoring  test my blood glucose as discussed   Reducing Risk do foot checks daily;examine blood glucose patterns   Post-Education Assessment   Patient understands the diabetes disease and treatment process. Demonstrates understanding / competency   Patient understands incorporating nutritional management into lifestyle. Demonstrates understanding / competency   Patient undertands incorporating physical activity into lifestyle. Demonstrates understanding / competency   Patient understands using medications safely. Demonstrates understanding / competency   Patient understands monitoring blood glucose, interpreting and using results Demonstrates understanding / competency   Patient understands prevention, detection, and treatment of acute complications. Demonstrates understanding / competency   Patient understands prevention, detection, and treatment of chronic complications. Demonstrates understanding / competency   Patient understands how to develop strategies to address psychosocial issues. Demonstrates understanding / competency   Patient understands how to develop strategies to promote health/change behavior. Demonstrates understanding / competency   Outcomes   Expected Outcomes Demonstrated interest in learning. Expect positive outcomes   Future DMSE PRN   Program Status Completed      Individualized Plan for Diabetes Self-Management Training:   Learning Objective:  Patient will have a greater understanding of  diabetes self-management. Patient education plan is to attend individual and/or group sessions per assessed needs and concerns.   Plan:   Patient Instructions  Goals:  Follow Diabetes Meal Plan as  instructed  Eat 3 meals and 2 snacks, every 3-5 hrs  Limit carbohydrate intake to 30-45 grams carbohydrate/meal  Limit carbohydrate intake to 15 grams carbohydrate/snack  Add lean protein foods to meals/snacks (Protein shake, boiled eggs, cheese, peanut butter, deli meat)  Monitor glucose levels as instructed by your doctor  Aim for 30 mins of physical activity daily - start by walking [facility] for 20 minutes for 3 x week  Get more glucose tablets  Check with Dr. Buddy Duty if you continue to have blood sugar above 200 after making diet changes    Expected Outcomes:  Demonstrated interest in learning. Expect positive outcomes  Education material provided: Living Well with Diabetes, Meal plan card and Snack sheet  If problems or questions, patient to contact team via:  Phone  Future DSME appointment: PRN

## 2016-05-06 ENCOUNTER — Ambulatory Visit: Payer: Medicare Other | Admitting: *Deleted

## 2016-08-22 ENCOUNTER — Other Ambulatory Visit (INDEPENDENT_AMBULATORY_CARE_PROVIDER_SITE_OTHER): Payer: Self-pay | Admitting: Orthopedic Surgery

## 2016-08-26 ENCOUNTER — Telehealth (INDEPENDENT_AMBULATORY_CARE_PROVIDER_SITE_OTHER): Payer: Self-pay | Admitting: Orthopaedic Surgery

## 2016-08-26 NOTE — Telephone Encounter (Signed)
Patient would like to know if rx Diclofenac has been sent to the pharmacy yet? Patient states the pharmacy has sent over a refill request.

## 2016-08-29 NOTE — Telephone Encounter (Signed)
We have not seen her in 6 months.  Need to see [place] for more meds or could have [facility] do it

## 2016-08-29 NOTE — Telephone Encounter (Signed)
Left VM on AM that she needs to schedule an appt or go to PCP

## 2016-09-09 IMAGING — MR MR CERVICAL SPINE W/O CM
5 series · 28 of 48 positions shown · non-contrast
Comparison: None.

CLINICAL DATA: Posterior neck pain radiating to both shoulder
blades starting 6 weeks ago.

EXAM:
MRI CERVICAL SPINE WITHOUT CONTRAST
TECHNIQUE: Multiplanar, multisequence MR imaging of the cervical spine was
performed. No intravenous contrast was administered.

[Series 3: T2 · sagittal · 3.0mm · 0.66mm/px · 6 of 12 slices shown (1 of 2)]
[im 1/12]
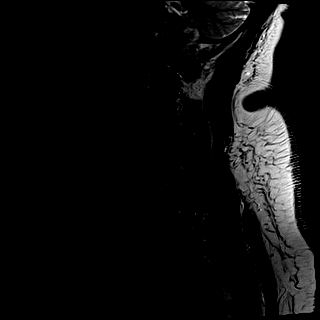
[im 3/12]
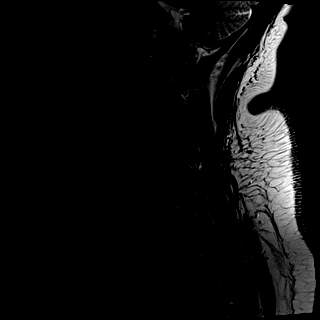
[im 5/12]
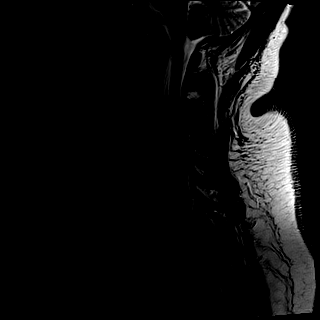
[im 7/12]
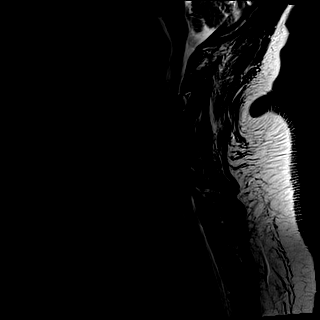
[im 9/12]
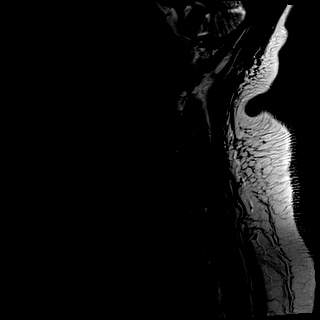
[im 12/12]
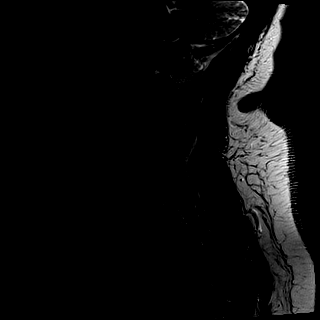

[Series 4: T1 · sagittal · 3.0mm · 0.41mm/px · 6 of 12 slices shown]
[im 1/12]
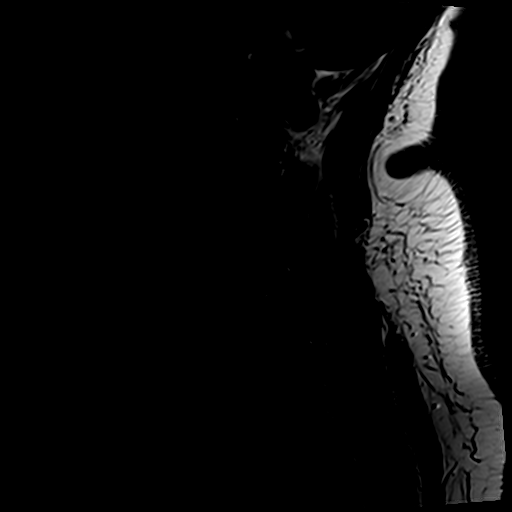
[im 3/12]
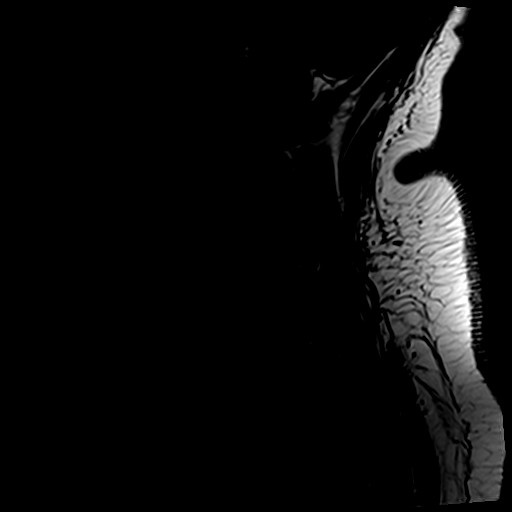
[im 5/12]
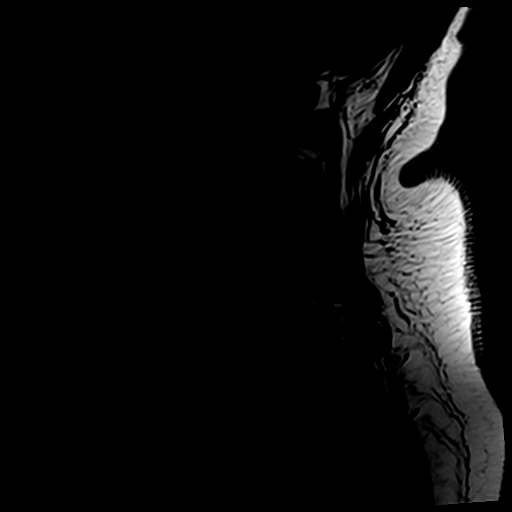
[im 7/12]
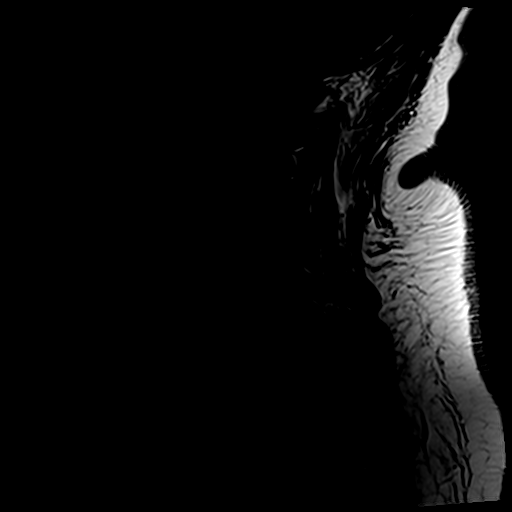
[im 9/12]
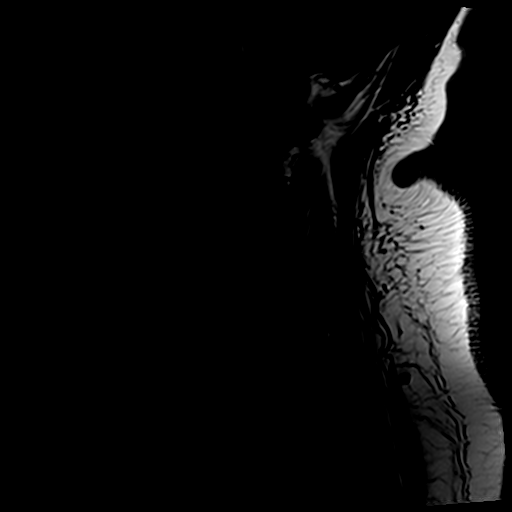
[im 12/12]
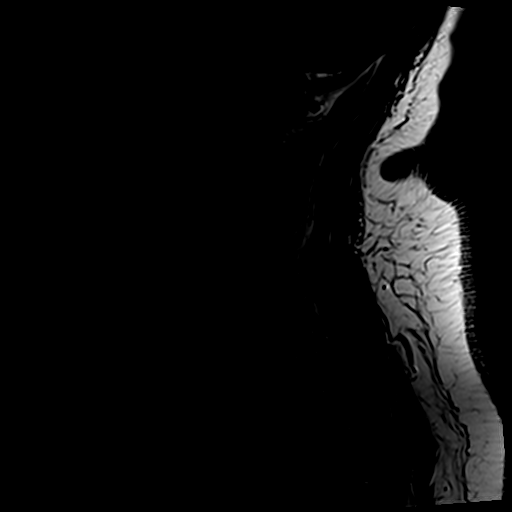

[Series 5: tir sag · sagittal · 3.0mm · 0.41mm/px · 6 of 12 slices shown]
[im 1/12]
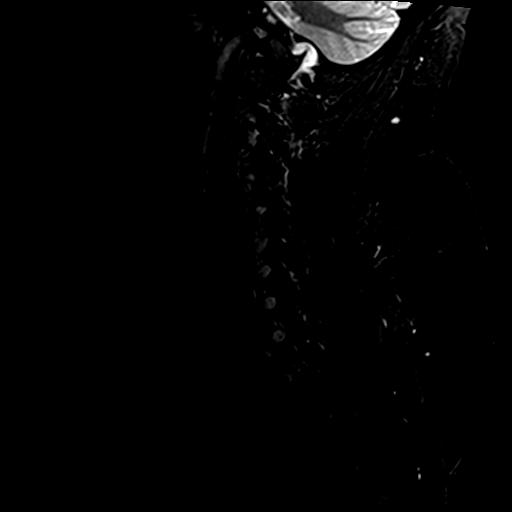
[im 3/12]
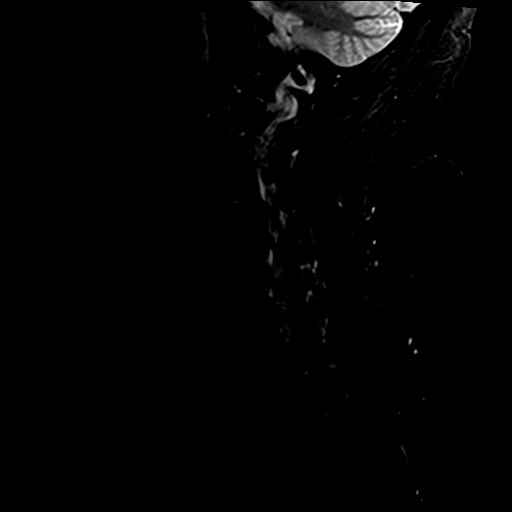
[im 5/12]
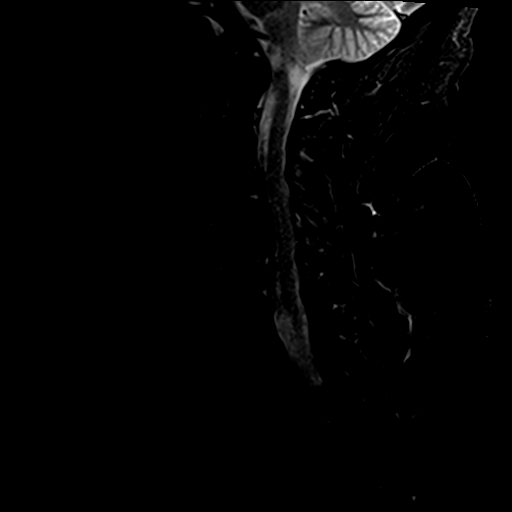
[im 7/12]
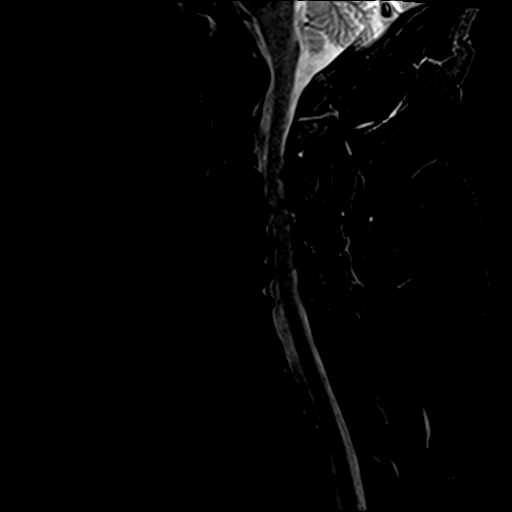
[im 9/12]
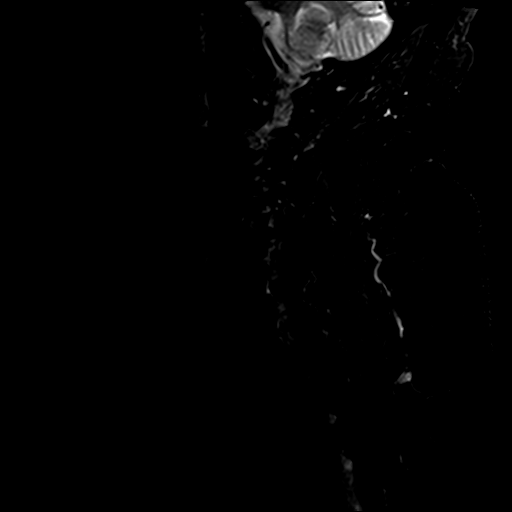
[im 12/12]
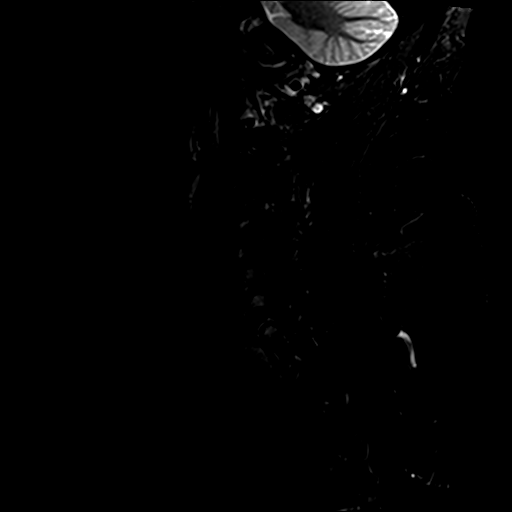

[Series 6: T2 · axial · 3.0mm · 0.70mm/px · z∈[-41,+57]mm · 9 of 28 slices shown (2 of 2)]
[im 1/28]
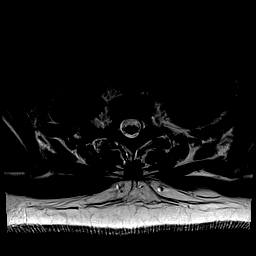
[im 4/28]
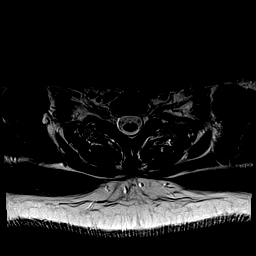
[im 8/28]
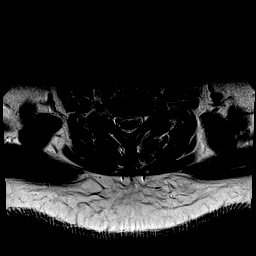
[im 12/28]
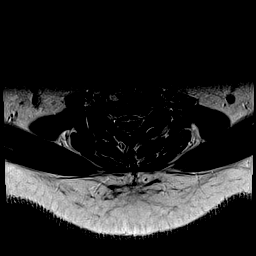
[im 14/28]
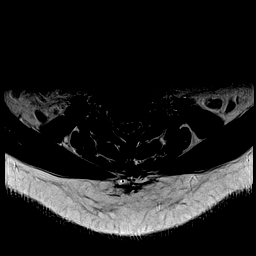
[im 16/28]
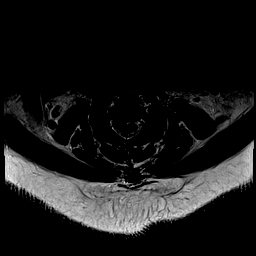
[im 20/28]
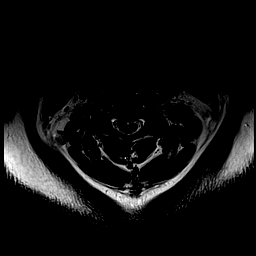
[im 24/28]
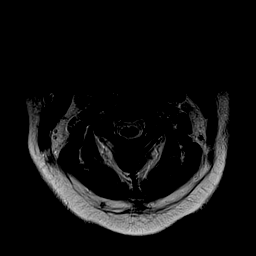
[im 28/28]
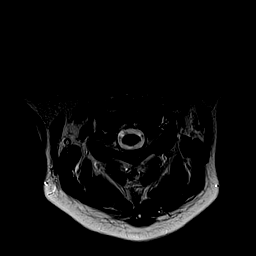

[Series 7: GRE · axial · 3.0mm · 0.35mm/px · 1 of 28 slices shown]
[im 1/28]
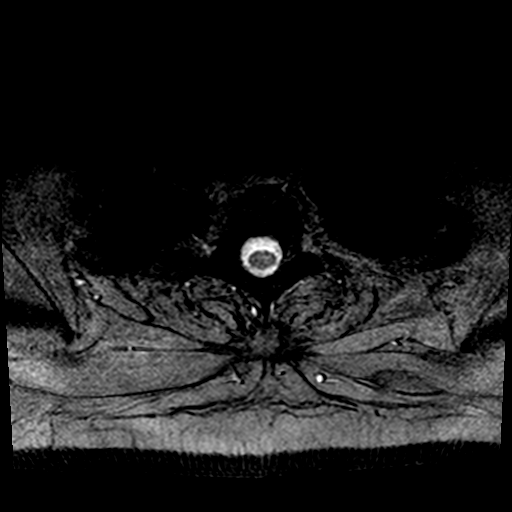

[28 of 48 positions shown; findings below may reference images not displayed]

FINDINGS: Alignment: 1.5 mm degenerative anterolisthesis at C3-4 ; 2 mm
degenerative anterolisthesis at C4-5 ; 1.5 mm degenerative
retrolisthesis at C5-6.

Vertebrae: Multilevel degenerative endplate findings. This is
associated with loss of disc height especially at C5 through T1.
Disc desiccation throughout cervical spine.

Cord: No significant abnormal spinal cord signal is observed.

Posterior Fossa, vertebral arteries, paraspinal tissues:
Unremarkable

Disc levels:

C2-3:  No impingement.  Left facet arthropathy.

C3-4: Mild central narrowing of the thecal sac and mild left
foraminal stenosis due to central disc protrusion and left facet and
uncinate spurring.

C4-5: Moderate bilateral foraminal stenosis and moderate central
narrowing of the thecal sac due to disc uncovering, central disc
protrusion, uncinate spurring, and facet spurring.

C5-6: Moderate to prominent bilateral foraminal stenosis and
moderate central narrowing of the thecal sac due to disc bulge,
uncinate spurring, and facet arthropathy.

C6-7: Moderate left and mild right foraminal stenosis and borderline
central narrowing of the thecal sac due to disc osteophyte complex,
uncinate spurring, and facet arthropathy.

C7-T1: Mild left and borderline right foraminal stenosis due to
uncinate spurring and disc bulge.
IMPRESSION: 1. Cervical spondylosis and degenerative disc disease, causing
moderate to prominent impingement at C5-6 ; moderate impingement at
C4-5 and C6-7; and mild impingement at C3-4 and C7-T1, as detailed
above.

## 2017-02-22 ENCOUNTER — Ambulatory Visit (INDEPENDENT_AMBULATORY_CARE_PROVIDER_SITE_OTHER): Payer: Medicare Other | Admitting: Podiatry

## 2017-02-22 ENCOUNTER — Encounter: Payer: Self-pay | Admitting: Podiatry

## 2017-02-22 VITALS — BP 140/76 | HR 91 | Resp 16 | Ht 64.0 in | Wt 220.0 lb

## 2017-02-22 DIAGNOSIS — E1142 Type 2 diabetes mellitus with diabetic polyneuropathy: Secondary | ICD-10-CM

## 2017-02-22 NOTE — Progress Notes (Signed)
   Subjective:    Patient ID: Jamie Burke, female    DOB: [birth year], 71 y.o.   MRN: [id number]  HPI   .   This patient presents today requesting a generalized diabetic foot exam with a complaint of diffuse achiness in the dorsal aspect right and left feet most also in the morning for 30 minutes and reducing somewhat with time. Also patient describes an ongoing burning stinging in her toes right knee on and off weightbearing present for many years with mild increase in symptoms over time the symptoms have been present for approximately 6 months and has not progressed much intensity over time.  Patient is diabetic for approximately 20 years Denies history of foot ulceration, claudication or amputation She is a former smoker   Review of Systems  HENT: Positive for hearing loss and sinus pressure.   Eyes: Positive for itching.  Gastrointestinal: Positive for abdominal distention and constipation.  All other systems reviewed and are negative.      Objective:   Physical Exam  Orientated 3  Vascular: Mild peripheral pitting edema bilaterally DP and PT pulses 2/4 bilaterally Capillary reflex immediate bilaterally  Neurological: Sensation to 10 g monofilament wire intact 5/5 bilaterally Vibratory sensation reactive bilaterally Ankle reflex equal and reactive bilaterally  Dermatological: No open skin lesions bilaterally  texture and turgor within normal limits  Musculoskeletal: HAV bilaterally Manual motor testing dorsi flexion, plantar flexion, inversion, eversion 5/5 bilaterally There is no pain or crepitus on range of motion of ankle, subtalar, midtarsal joints bilaterally       Assessment & Plan:   Assessment: Protective sensation intact bilaterally Diabetic peripheral neuropathy Midfoot arthralgia HAV bilaterally  Plan: I reviewed results exam patient today and informed patient that there was no acute problem noted on exam today. I encouraged patient to  wear athletic/walking shoes and replace a regular basis. Also, patient's A1c per patient is increasing encouraged her to or with her internist to get better control of her diabetes  Reappoint at patient's request

## 2017-02-22 NOTE — Patient Instructions (Signed)
Wear supportive lace up athletic style shoes and ongoing daily basis Rotate your shoes and replace after 6-12 months  Diabetes and Foot Care Diabetes may cause you to have problems because of poor blood supply (circulation) to your feet and legs. This may cause the skin on your feet to become thinner, break easier, and heal more slowly. Your skin may become dry, and the skin may peel and crack. You may also have nerve damage in your legs and feet causing decreased feeling in them. You may not notice minor injuries to your feet that could lead to infections or more serious problems. Taking care of your feet is one of the most important things you can do for yourself. Follow these instructions at home:  Wear shoes at all times, even in the house. Do not go barefoot. Bare feet are easily injured.  Check your feet daily for blisters, cuts, and redness. If you cannot see the bottom of your feet, use a mirror or ask someone for help.  Wash your feet with warm water (do not use hot water) and mild soap. Then pat your feet and the areas between your toes until they are completely dry. Do not soak your feet as this can dry your skin.  Apply a moisturizing lotion or petroleum jelly (that does not contain alcohol and is unscented) to the skin on your feet and to dry, brittle toenails. Do not apply lotion between your toes.  Trim your toenails straight across. Do not dig under them or around the cuticle. File the edges of your nails with an emery board or nail file.  Do not cut corns or calluses or try to remove them with medicine.  Wear clean socks or stockings every day. Make sure they are not too tight. Do not wear knee-high stockings since they may decrease blood flow to your legs.  Wear shoes that fit properly and have enough cushioning. To break in new shoes, wear them for just a few hours a day. This prevents you from injuring your feet. Always look in your shoes before you put them on to be sure  there are no objects inside.  Do not cross your legs. This may decrease the blood flow to your feet.  If you find a minor scrape, cut, or break in the skin on your feet, keep it and the skin around it clean and dry. These areas may be cleansed with mild soap and water. Do not cleanse the area with peroxide, alcohol, or iodine.  When you remove an adhesive bandage, be sure not to damage the skin around it.  If you have a wound, look at it several times a day to make sure it is healing.  Do not use heating pads or hot water bottles. They may burn your skin. If you have lost feeling in your feet or legs, you may not know it is happening until it is too late.  Make sure your health care provider performs a complete foot exam at least annually or more often if you have foot problems. Report any cuts, sores, or bruises to your health care provider immediately. Contact a health care provider if:  You have an injury that is not healing.  You have cuts or breaks in the skin.  You have an ingrown nail.  You notice redness on your legs or feet.  You feel burning or tingling in your legs or feet.  You have pain or cramps in your legs and feet.  Your  legs or feet are numb.  Your feet always feel cold. Get help right away if:  There is increasing redness, swelling, or pain in or around a wound.  There is a red line that goes up your leg.  Pus is coming from a wound.  You develop a fever or as directed by your health care provider.  You notice a bad smell coming from an ulcer or wound. This information is not intended to replace advice given to you by your health care provider. Make sure you discuss any questions you have with your health care provider. Document Released: 10/07/2000 Document Revised: 03/17/2016 Document Reviewed: 03/19/2013 Elsevier Interactive Patient Education  2017 [employer].

## 2017-09-20 ENCOUNTER — Ambulatory Visit (INDEPENDENT_AMBULATORY_CARE_PROVIDER_SITE_OTHER): Payer: Medicare Other

## 2017-09-20 ENCOUNTER — Ambulatory Visit (INDEPENDENT_AMBULATORY_CARE_PROVIDER_SITE_OTHER): Payer: Medicare Other | Admitting: Orthopaedic Surgery

## 2017-09-20 ENCOUNTER — Encounter (INDEPENDENT_AMBULATORY_CARE_PROVIDER_SITE_OTHER): Payer: Self-pay | Admitting: Orthopaedic Surgery

## 2017-09-20 VITALS — BP 140/77 | HR 90

## 2017-09-20 DIAGNOSIS — G8929 Other chronic pain: Secondary | ICD-10-CM | POA: Diagnosis not present

## 2017-09-20 DIAGNOSIS — M25511 Pain in right shoulder: Secondary | ICD-10-CM

## 2017-09-20 DIAGNOSIS — M542 Cervicalgia: Secondary | ICD-10-CM | POA: Diagnosis not present

## 2017-09-20 NOTE — Progress Notes (Signed)
Office Visit Note   Patient: Jamie Burke           Date of Birth: 11-Jan-1946           MRN: [id number] Visit Date: 09/20/2017              Requested by: Donald Prose, MD [place] [place], [place] [address] PCP: Donald Prose, MD   Assessment & Plan: Visit Diagnoses:  1. Chronic right shoulder pain   2. Neck pain     Plan: Physical therapy for treatment of her neck and right shoulder discomfort with cervical spondylosis.  Office follow-up 5 weeks for recheck.  Follow-Up Instructions: Return in about 5 weeks (around 10/25/2017).   Orders:  Orders Placed This Encounter  Procedures  . XR Cervical Spine 2 or 3 views  . XR Shoulder Right   No orders of the defined types were placed in this encounter.     Procedures: No procedures performed   Clinical Data: No additional findings.   Subjective: Chief Complaint  Patient presents with  . Right Shoulder - Pain  . Neck - Pain    HPI 71 year old female with right shoulder and right knee.  Her elbow is been present for 2-3 months she has difficulty sleeping diclofenac with minimal relief..She  Is right-hand dominant  and is a diabetic.  Review of Systems positive for type 2 diabetes insulin-dependent.  Positive for hypertension, hyperlipidemia.  Obesity.   Objective: Vital Signs: BP 140/77   Pulse 90   Physical Exam  Constitutional: She is oriented to person, place, and time. She appears well-developed.  HENT:  Head: Normocephalic.  Right Ear: External ear normal.  Left Ear: External ear normal.  Eyes: Pupils are equal, round, and reactive to light.  Neck: No tracheal deviation present. No thyromegaly present.  Cardiovascular: Normal rate.  Pulmonary/Chest: Effort normal.  Abdominal: Soft.  Neurological: She is alert and oriented to person, place, and time.  Skin: Skin is warm and dry.  Psychiatric: She has a normal mood and affect. Her behavior is normal.    Ortho Exam patient has some  brachial plexus tenderness.  50% range of motion limitation of cervical spine with rotation and tilting.  Upper extremity reflexes are 2+ and symmetrical.  Discomfort right shoulder with impingement test.  Long head of the biceps is normal no subluxation of the shoulder.  No atrophy.  Biceps triceps wrist extension flexion is normal.  Specialty Comments:  No specialty comments available.  Imaging: No results found.   PMFS History: Patient Active Problem List   Diagnosis Date Noted  . Rotator cuff injury 05/23/2011  . Seasonal allergies 05/23/2011  . Lesion of nose 05/23/2011  . Hyperlipidemia LDL goal < 100 04/07/2011  . Obesity 04/07/2011  . HTN (hypertension) 03/15/2011  . Diabetes mellitus, type II, insulin dependent ([place]) 03/15/2011   Past Medical History:  Diagnosis Date  . Diabetes mellitus   . Dysplastic nevi   . Gastroparesis   . GERD (gastroesophageal reflux disease)   . Hypertension   . Melanoma ([place])     Family History  Problem Relation Age of Onset  . Diabetes Mother   . Heart disease Mother   . Hypertension Mother   . Stroke Mother   . Hypertension Father   . Stroke Father   . Cancer Brother 63       Lung Cancer  . Kidney disease Brother     Past Surgical History:  Procedure Laterality  Date  . ABDOMINAL HYSTERECTOMY    . CARPAL TUNNEL RELEASE     BL  . TRIGGER FINGER RELEASE     Social History   Occupational History  . Not on file  Tobacco Use  . Smoking status: Former Smoker    Years: 2.00  . Smokeless tobacco: Never Used  Substance and Sexual Activity  . Alcohol use: No  . Drug use: No  . Sexual activity: No    Birth control/protection: Surgical

## 2017-09-27 ENCOUNTER — Encounter (INDEPENDENT_AMBULATORY_CARE_PROVIDER_SITE_OTHER): Payer: Self-pay | Admitting: Orthopaedic Surgery

## 2018-07-20 ENCOUNTER — Other Ambulatory Visit (HOSPITAL_COMMUNITY): Payer: Self-pay | Admitting: Cardiology

## 2018-07-20 ENCOUNTER — Other Ambulatory Visit: Payer: Self-pay | Admitting: Cardiology

## 2018-07-20 DIAGNOSIS — E1142 Type 2 diabetes mellitus with diabetic polyneuropathy: Secondary | ICD-10-CM

## 2018-07-30 ENCOUNTER — Encounter (HOSPITAL_COMMUNITY): Payer: Medicare Other

## 2018-07-30 ENCOUNTER — Telehealth (HOSPITAL_COMMUNITY): Payer: Self-pay | Admitting: Radiology

## 2018-07-30 NOTE — Telephone Encounter (Signed)
Patient given detailed instructions per Myocardial Perfusion Study Information Sheet for the test on 08/01/2018 at 9:45. Patient notified to arrive 15 minutes early and that it is imperative to arrive on time for appointment to keep from having the test rescheduled.  If you need to cancel or reschedule your appointment, please call the office within 24 hours of your appointment. . Patient verbalized understanding.EHK

## 2018-08-01 ENCOUNTER — Ambulatory Visit (HOSPITAL_COMMUNITY): Payer: Medicare Other | Attending: Cardiology

## 2018-08-01 DIAGNOSIS — E1142 Type 2 diabetes mellitus with diabetic polyneuropathy: Secondary | ICD-10-CM | POA: Insufficient documentation

## 2018-08-01 MED ORDER — TECHNETIUM TC 99M TETROFOSMIN IV KIT
32.9000 | PACK | Freq: Once | INTRAVENOUS | Status: AC | PRN
  Administered 2018-08-01: 32.9 via INTRAVENOUS
  Filled 2018-08-01: qty 33

## 2018-08-07 ENCOUNTER — Ambulatory Visit (HOSPITAL_COMMUNITY): Payer: Medicare Other | Attending: Cardiology

## 2018-08-07 LAB — MYOCARDIAL PERFUSION IMAGING
CHL CUP NUCLEAR SDS: 0
CHL CUP NUCLEAR SRS: 1
CHL CUP RESTING HR STRESS: 85 {beats}/min
CSEPEDS: 0 s
CSEPHR: 94 %
Estimated workload: 4.6 METS
Exercise duration (min): 4 min
LV sys vol: 22 mL
LVDIAVOL: 52 mL (ref 46–106)
MPHR: 149 {beats}/min
Peak HR: 141 {beats}/min
SSS: 1
TID: 0.94

## 2018-08-07 MED ORDER — TECHNETIUM TC 99M TETROFOSMIN IV KIT
30.2000 | PACK | Freq: Once | INTRAVENOUS | Status: AC | PRN
  Administered 2018-08-07: 30.2 via INTRAVENOUS
  Filled 2018-08-07: qty 31

## 2018-08-15 ENCOUNTER — Telehealth: Payer: Self-pay | Admitting: Emergency Medicine

## 2018-08-15 NOTE — Telephone Encounter (Signed)
Left message for patient to discuss results from stress test and to schedule follow up appointment per Dr. Agustin Cree.

## 2018-08-17 NOTE — Progress Notes (Signed)
Cardiology Office Note:    Date:  08/20/2018   ID:  Jamie Burke, Jamie Burke [birth year], MRN [id number]  PCP:  Donald Prose, MD  Cardiologist:  Shirlee More, MD    Referring MD: Donald Prose, MD    ASSESSMENT:    1. Essential hypertension   2. Chest pain in adult   3. RBBB    PLAN:    In order of problems listed above:  1. Stable continue diuretic 2. Myocardial perfusion study was low risk normal at this time she is not having chest pain and if she were to have symptoms that were more persistent typical would benefit from cardiac CTA. 3. Stable EKG pattern right bundle branch block left anterior hemiblock 4. With diabetes and hyperlipidemia I asked her to start a low intensity statin pravastatin 20 mg/day follow-up labs with her PCP   Next appointment: As needed   Medication Adjustments/Labs and Tests Ordered: Current medicines are reviewed at length with the patient today.  Concerns regarding medicines are outlined above.  No orders of the defined types were placed in this encounter.  No orders of the defined types were placed in this encounter.   No chief complaint on file.   History of Present Illness:    Jamie Burke is a 72 y.o. female with a hx of chest pain last seen 06/29/18.  Showing a underwent a stress myocardial perfusion study 08/07/2018 occasional PVCs were seen blood pressure response was hypertensive exercise tolerance was diminished at 4.6 months.  Myocardial perfusion images were normal for a woman left ventricular cavity size systolic function ejection fraction with normal 57%.  The test was limited by exertional shortness of breath there is no chest pain the EKG response was normal with underlying right bundle branch block.  Compliance with diet, lifestyle and medications: Yes  Previously she is having a pattern of exertional chest discomfort indigestion that was relieved with the symptoms have resolved she has had no further chest discomfort and is  not typical angina.  She also has a history of reflux symptoms.  Her myocardial perfusion study was normal at this time I will hold on further cardiac diagnostic test if she has recurrence of more typical symptoms cardiac CTA would be appropriate Past Medical History:  Diagnosis Date  . Diabetes mellitus   . Dysplastic nevi   . Gastroparesis   . GERD (gastroesophageal reflux disease)   . Hypertension   . Melanoma [facility])     Past Surgical History:  Procedure Laterality Date  . ABDOMINAL HYSTERECTOMY    . CARPAL TUNNEL RELEASE     BL  . TRIGGER FINGER RELEASE      Current Medications: Current Meds  Medication Sig  . hydrochlorothiazide (HYDRODIURIL) 25 MG tablet Take 25 mg by mouth daily.   [name] ibuprofen (ADVIL,MOTRIN) 200 MG tablet Take 200 mg by mouth every 6 (six) hours as needed for pain.  [name] insulin lispro protamine-lispro (HUMALOG 75/25) (75-25) 100 UNIT/ML SUSP Inject 40-50 Units into the skin 2 (two) times daily with a meal.  . linaclotide (LINZESS) 72 MCG capsule Take 72 mcg by mouth daily as needed.  [name] omeprazole (PRILOSEC) 20 MG capsule Take 20 mg by mouth daily as needed.     Allergies:   Lisinopril; Sumatriptan; and Sulfa antibiotics   Social History   Socioeconomic History  . Marital status: Married    Spouse name: Not on file  . Number of children: Not on file  . Years of education: Not  on file  . Highest education level: Not on file  Occupational History  . Not on file  Social Needs  . Financial resource strain: Not on file  . Food insecurity:    Worry: Not on file    Inability: Not on file  . Transportation needs:    Medical: Not on file    Non-medical: Not on file  Tobacco Use  . Smoking status: Former Smoker    Years: 2.00  . Smokeless tobacco: Never Used  Substance and Sexual Activity  . Alcohol use: No  . Drug use: No  . Sexual activity: Never    Birth control/protection: Surgical  Lifestyle  . Physical activity:    Days per week: Not on file     Minutes per session: Not on file  . Stress: Not on file  Relationships  . Social connections:    Talks on phone: Not on file    Gets together: Not on file    Attends religious service: Not on file    Active member of club or organization: Not on file    Attends meetings of clubs or organizations: Not on file    Relationship status: Not on file  Other Topics Concern  . Not on file  Social History Narrative  . Not on file     Family History: The patient's family history includes Cancer (age of onset: 44) in her brother; Diabetes in her mother; Heart disease in her mother; Hypertension in her father and mother; Kidney disease in her brother; Stroke in her father and mother. ROS:   Please see the history of present illness.    All other systems reviewed and are negative.  EKGs/Labs/Other Studies Reviewed:    The following studies were reviewed today:  Recent Labs: No results found for requested labs within last 8760 hours.  Recent Lipid Panel    Component Value Date/Time   LDLDIRECT 137 (H) 03/15/2011 1128    Physical Exam:    VS:  BP 112/64 (BP Location: Left Arm, Patient Position: Sitting, Cuff Size: Large)   Pulse 88   Ht 5' 4.5" (1.638 m)   Wt 220 lb 12.8 oz (100.2 kg)   SpO2 97%   BMI 37.31 kg/m     Wt Readings from Last 3 Encounters:  08/20/18 220 lb 12.8 oz (100.2 kg)  08/01/18 215 lb (97.5 kg)  02/22/17 220 lb (99.8 kg)     GEN:  Well nourished, well developed in no acute distress HEENT: Normal NECK: No JVD; No carotid bruits LYMPHATICS: No lymphadenopathy CARDIAC: RRR, no murmurs, rubs, gallops RESPIRATORY:  Clear to auscultation without rales, wheezing or rhonchi  ABDOMEN: Soft, non-tender, non-distended MUSCULOSKELETAL:  No edema; No deformity  SKIN: Warm and dry NEUROLOGIC:  Alert and oriented x 3 PSYCHIATRIC:  Normal affect    Signed, Shirlee More, MD  08/20/2018 1:58 PM    [facility]

## 2018-08-20 ENCOUNTER — Ambulatory Visit (INDEPENDENT_AMBULATORY_CARE_PROVIDER_SITE_OTHER): Payer: Medicare Other | Admitting: Cardiology

## 2018-08-20 ENCOUNTER — Encounter: Payer: Self-pay | Admitting: Cardiology

## 2018-08-20 VITALS — BP 112/64 | HR 88 | Ht 64.5 in | Wt 220.8 lb

## 2018-08-20 DIAGNOSIS — I4510 Unspecified right bundle-branch block: Secondary | ICD-10-CM | POA: Insufficient documentation

## 2018-08-20 DIAGNOSIS — I10 Essential (primary) hypertension: Secondary | ICD-10-CM

## 2018-08-20 DIAGNOSIS — R0789 Other chest pain: Secondary | ICD-10-CM | POA: Insufficient documentation

## 2018-08-20 DIAGNOSIS — R079 Chest pain, unspecified: Secondary | ICD-10-CM

## 2018-08-20 MED ORDER — PRAVASTATIN SODIUM 20 MG PO TABS: 20.0000 mg | ORAL_TABLET | Freq: Every evening | ORAL | 3 refills | Status: DC

## 2018-08-20 NOTE — Addendum Note (Signed)
Addended by: Austin Miles on: 08/20/2018 02:08 PM   Modules accepted: Orders

## 2018-08-20 NOTE — Patient Instructions (Signed)
Medication Instructions:  Your physician has recommended you make the following change in your medication:   START pravastatin (pravachol) 20 mg: Take 1 tablet daily   If you need a refill on your cardiac medications before your next appointment, please call your pharmacy.   Lab work: None  If you have labs (blood work) drawn today and your tests are completely normal, you will receive your results only by: [name] MyChart Message (if you have MyChart) OR . A paper copy in the mail If you have any lab test that is abnormal or we need to change your treatment, we will call you to review the results.  Testing/Procedures: None  Follow-Up: At [facility], you and your health needs are our priority.  As part of our continuing mission to provide you with exceptional heart care, we have created designated Provider Care Teams.  These Care Teams include your primary Cardiologist (physician) and Advanced Practice Providers (APPs -  Physician Assistants and Nurse Practitioners) who all work together to provide you with the care you need, when you need it. You will need a follow up appointment as needed if symptoms worsen or fail to improve.    Pravastatin tablets What is this medicine? PRAVASTATIN (PRA va stat in) is known as a HMG-CoA reductase inhibitor or 'statin'. It lowers the level of cholesterol and triglycerides in the blood. This drug may also reduce the risk of heart attack, stroke, or other health problems in patients with risk factors for heart disease. Diet and lifestyle changes are often used with this drug. This medicine may be used for other purposes; ask your health care provider or pharmacist if you have questions. COMMON BRAND NAME(S): Pravachol What should I tell my health care provider before I take this medicine? They need to know if you have any of these conditions: -frequently drink alcoholic beverages -kidney disease -liver disease -muscle aches or weakness -other medical  condition -an unusual or allergic reaction to pravastatin, other medicines, foods, dyes, or preservatives -pregnant or trying to get pregnant -breast-feeding How should I use this medicine? Take pravastatin tablets by mouth. Swallow the tablets with a drink of water. Pravastatin can be taken at anytime of the day, with or without food. Follow the directions on the prescription label. Take your doses at regular intervals. Do not take your medicine more often than directed. Talk to your pediatrician regarding the use of this medicine in children. Special care may be needed. Pravastatin has been used in children as young as 56 years of age. Overdosage: If you think you have taken too much of this medicine contact a poison control center or emergency room at once. NOTE: This medicine is only for you. Do not share this medicine with others. What if I miss a dose? If you miss a dose, take it as soon as you can. If it is almost time for your next dose, take only that dose. Do not take double or extra doses. What may interact with this medicine? This medicine may interact with the following medications: -colchicine -cyclosporine -other medicines for high cholesterol -some antibiotics like azithromycin, clarithromycin, erythromycin, and telithromycin This list may not describe all possible interactions. Give your health care provider a list of all the medicines, herbs, non-prescription drugs, or dietary supplements you use. Also tell them if you smoke, drink alcohol, or use illegal drugs. Some items may interact with your medicine. What should I watch for while using this medicine? Visit your doctor or health care professional  for regular check-ups. You may need regular tests to make sure your liver is working properly. Tell your doctor or health care professional right away if you get any unexplained muscle pain, tenderness, or weakness, especially if you also have a fever and tiredness. Your doctor or  health care professional may tell you to stop taking this medicine if you develop muscle problems. If your muscle problems do not go away after stopping this medicine, contact your health care professional. This drug is only part of a total heart-health program. Your doctor or a dietician can suggest a low-cholesterol and low-fat diet to help. Avoid alcohol and smoking, and keep a proper exercise schedule. Do not use this drug if you are pregnant or breast-feeding. Serious side effects to an unborn child or to an infant are possible. Talk to your doctor or pharmacist for more information. This medicine may affect blood sugar levels. If you have diabetes, check with your doctor or health care professional before you change your diet or the dose of your diabetic medicine. If you are going to have surgery tell your health care professional that you are taking this drug. What side effects may I notice from receiving this medicine? Side effects that you should report to your doctor or health care professional as soon as possible: -allergic reactions like skin rash, itching or hives, swelling of the face, lips, or tongue -dark urine -fever -muscle pain, cramps, or weakness -redness, blistering, peeling or loosening of the skin, including inside the mouth -trouble passing urine or change in the amount of urine -unusually weak or tired -yellowing of the eyes or skin Side effects that usually do not require medical attention (report to your doctor or health care professional if they continue or are bothersome): -gas -headache -heartburn -indigestion -stomach pain This list may not describe all possible side effects. Call your doctor for medical advice about side effects. You may report side effects to FDA at 1-800-FDA-1088. Where should I keep my medicine? Keep out of the reach of children. Store at room temperature between 15 to 30 degrees C (59 to 86 degrees F). Protect from light. Keep container  tightly closed. Throw away any unused medicine after the expiration date. NOTE: This sheet is a summary. It may not cover all possible information. If you have questions about this medicine, talk to your doctor, pharmacist, or health care provider.  2018 Elsevier/Gold Standard (2015-05-07 16:00:27)

## 2018-08-28 ENCOUNTER — Ambulatory Visit: Payer: Medicare Other | Admitting: Cardiology

## 2019-02-27 ENCOUNTER — Encounter: Payer: Self-pay | Admitting: Podiatry

## 2019-02-27 ENCOUNTER — Other Ambulatory Visit: Payer: Self-pay

## 2019-02-27 ENCOUNTER — Ambulatory Visit: Payer: Medicare Other | Admitting: Podiatry

## 2019-02-27 ENCOUNTER — Ambulatory Visit (INDEPENDENT_AMBULATORY_CARE_PROVIDER_SITE_OTHER): Payer: Medicare Other

## 2019-02-27 VITALS — Temp 97.3°F

## 2019-02-27 DIAGNOSIS — M2011 Hallux valgus (acquired), right foot: Secondary | ICD-10-CM

## 2019-02-27 DIAGNOSIS — M2012 Hallux valgus (acquired), left foot: Secondary | ICD-10-CM

## 2019-02-27 DIAGNOSIS — B351 Tinea unguium: Secondary | ICD-10-CM | POA: Diagnosis not present

## 2019-02-27 DIAGNOSIS — M79675 Pain in left toe(s): Secondary | ICD-10-CM | POA: Diagnosis not present

## 2019-02-27 DIAGNOSIS — M79674 Pain in right toe(s): Secondary | ICD-10-CM | POA: Diagnosis not present

## 2019-02-27 DIAGNOSIS — M779 Enthesopathy, unspecified: Secondary | ICD-10-CM

## 2019-02-27 DIAGNOSIS — G4700 Insomnia, unspecified: Secondary | ICD-10-CM | POA: Insufficient documentation

## 2019-02-27 DIAGNOSIS — E119 Type 2 diabetes mellitus without complications: Secondary | ICD-10-CM | POA: Insufficient documentation

## 2019-02-27 MED ORDER — TRIAMCINOLONE ACETONIDE 10 MG/ML IJ SUSP
10.0000 mg | Freq: Once | INTRAMUSCULAR | Status: AC
  Administered 2019-02-27: 10 mg

## 2019-02-27 NOTE — Patient Instructions (Signed)
Diabetes Mellitus and Foot Care  Foot care is an important part of your health, especially when you have diabetes. Diabetes may cause you to have problems because of poor blood flow (circulation) to your feet and legs, which can cause your skin to:   Become thinner and drier.   Break more easily.   Heal more slowly.   Peel and crack.  You may also have nerve damage (neuropathy) in your legs and feet, causing decreased feeling in them. This means that you may not notice minor injuries to your feet that could lead to more serious problems. Noticing and addressing any potential problems early is the best way to prevent future foot problems.  How to care for your feet  Foot hygiene   Wash your feet daily with warm water and mild soap. Do not use hot water. Then, pat your feet and the areas between your toes until they are completely dry. Do not soak your feet as this can dry your skin.   Trim your toenails straight across. Do not dig under them or around the cuticle. File the edges of your nails with an emery board or nail file.   Apply a moisturizing lotion or petroleum jelly to the skin on your feet and to dry, brittle toenails. Use lotion that does not contain alcohol and is unscented. Do not apply lotion between your toes.  Shoes and socks   Wear clean socks or stockings every day. Make sure they are not too tight. Do not wear knee-high stockings since they may decrease blood flow to your legs.   Wear shoes that fit properly and have enough cushioning. Always look in your shoes before you put them on to be sure there are no objects inside.   To break in new shoes, wear them for just a few hours a day. This prevents injuries on your feet.  Wounds, scrapes, corns, and calluses   Check your feet daily for blisters, cuts, bruises, sores, and redness. If you cannot see the bottom of your feet, use a mirror or ask someone for help.   Do not cut corns or calluses or try to remove them with medicine.   If you  find a minor scrape, cut, or break in the skin on your feet, keep it and the skin around it clean and dry. You may clean these areas with mild soap and water. Do not clean the area with peroxide, alcohol, or iodine.   If you have a wound, scrape, corn, or callus on your foot, look at it several times a day to make sure it is healing and not infected. Check for:  ? Redness, swelling, or pain.  ? Fluid or blood.  ? Warmth.  ? Pus or a bad smell.  General instructions   Do not cross your legs. This may decrease blood flow to your feet.   Do not use heating pads or hot water bottles on your feet. They may burn your skin. If you have lost feeling in your feet or legs, you may not know this is happening until it is too late.   Protect your feet from hot and cold by wearing shoes, such as at the beach or on hot pavement.   Schedule a complete foot exam at least once a year (annually) or more often if you have foot problems. If you have foot problems, report any cuts, sores, or bruises to your health care provider immediately.  Contact a health care provider if:     You have a medical condition that increases your risk of infection and you have any cuts, sores, or bruises on your feet.   You have an injury that is not healing.   You have redness on your legs or feet.   You feel burning or tingling in your legs or feet.   You have pain or cramps in your legs and feet.   Your legs or feet are numb.   Your feet always feel cold.   You have pain around a toenail.  Get help right away if:   You have a wound, scrape, corn, or callus on your foot and:  ? You have pain, swelling, or redness that gets worse.  ? You have fluid or blood coming from the wound, scrape, corn, or callus.  ? Your wound, scrape, corn, or callus feels warm to the touch.  ? You have pus or a bad smell coming from the wound, scrape, corn, or callus.  ? You have a fever.  ? You have a red line going up your leg.  Summary   Check your feet every day  for cuts, sores, red spots, swelling, and blisters.   Moisturize feet and legs daily.   Wear shoes that fit properly and have enough cushioning.   If you have foot problems, report any cuts, sores, or bruises to your health care provider immediately.   Schedule a complete foot exam at least once a year (annually) or more often if you have foot problems.  This information is not intended to replace advice given to you by your health care provider. Make sure you discuss any questions you have with your health care provider.  Document Released: 10/07/2000 Document Revised: 11/22/2017 Document Reviewed: 11/11/2016  Elsevier Interactive Patient Education  2019 Elsevier Inc.

## 2019-02-27 NOTE — Progress Notes (Signed)
Subjective:   Patient ID: Jamie Burke, female   DOB: 73 y.o.   MRN: [id number]   HPI Patient presents stating that she is developed a lot of pain around her bunion site left and she has nails that are thickened and she cannot cut herself and patient states that it is gradually gotten worse and her diabetes is not under good control.  Patient admits she is not been exercising or following good guidelines recently and patient does not smoke and likes to be active  Review of Systems  All other systems reviewed and are negative.       Objective:  Physical Exam Vitals signs and nursing note reviewed.  Constitutional:      Appearance: She is well-developed.  Pulmonary:     Effort: Pulmonary effort is normal.  Musculoskeletal: Normal range of motion.  Skin:    General: Skin is warm.  Neurological:     Mental Status: She is alert.     Vascular status intact diminishment in neurological sensation noted bilateral with sharp dull vibratory reduced.  Patient is noted to have thick yellow brittle nailbeds 1-5 both feet that do get incurvated in the corners and she cannot cut and is also noted to have structural bunion deformity left over right with redness around the area and moderate fluid buildup around the first MPJ left foot.  Patient was noted to have good digital perfusion well oriented x3     Assessment:  Acute capsulitis with inflammation first MPJ left with bunion deformity bilateral and mycotic nail infection symptomatic 1-5 both feet     Plan:  H&P conditions reviewed and today I did do sterile prep and capsular injection 3 mg dexamethasone Kenalog 5 mg Xylocaine to reduce inflammation discussed long-term diabetic shoes we will get approval from her endocrinologist and I debrided nailbeds 1-5 both feet with no iatrogenic bleeding and instructed on good diabetic foot care and gave her instructions to this effect.  Reappoint to recheck  X-rays indicate that there is structural  bunion deformity with spur formation around the first metatarsal head left over right

## 2019-03-25 ENCOUNTER — Ambulatory Visit: Payer: Medicare Other | Admitting: Orthotics

## 2019-05-28 ENCOUNTER — Ambulatory Visit: Payer: Medicare Other | Admitting: Podiatry

## 2019-06-04 ENCOUNTER — Ambulatory Visit: Payer: Medicare Other | Admitting: Podiatry

## 2019-07-19 ENCOUNTER — Encounter: Payer: Self-pay | Admitting: Podiatry

## 2019-07-19 ENCOUNTER — Ambulatory Visit (INDEPENDENT_AMBULATORY_CARE_PROVIDER_SITE_OTHER): Payer: Medicare Other | Admitting: Podiatry

## 2019-07-19 ENCOUNTER — Other Ambulatory Visit: Payer: Self-pay

## 2019-07-19 DIAGNOSIS — M79674 Pain in right toe(s): Secondary | ICD-10-CM

## 2019-07-19 DIAGNOSIS — B351 Tinea unguium: Secondary | ICD-10-CM

## 2019-07-19 DIAGNOSIS — M79675 Pain in left toe(s): Secondary | ICD-10-CM | POA: Diagnosis not present

## 2019-07-19 NOTE — Patient Instructions (Addendum)
NEW BALANCE 600SERIES  (OR HIGHER) OR PODIATRIST APPROVED SKETCHERS SNEAKERS   Diabetic Neuropathy Diabetic neuropathy refers to nerve damage that is caused by diabetes (diabetes mellitus). Over time, people with diabetes can develop nerve damage throughout the body. There are several types of diabetic neuropathy:  Peripheral neuropathy. This is the most common type of diabetic neuropathy. It causes damage to nerves that carry signals between the spinal cord and other parts of the body (peripheral nerves). This usually affects nerves in the feet and legs first, and may eventually affect the hands and arms. The damage affects the ability to sense touch or temperature.  Autonomic neuropathy. This type causes damage to nerves that control involuntary functions (autonomic nerves). These nerves carry signals that control: ? Heartbeat. ? Body temperature. ? Blood pressure. ? Urination. ? Digestion. ? Sweating. ? Sexual function. ? Response to changing blood sugar (glucose) levels.  Focal neuropathy. This type of nerve damage affects one area of the body, such as an arm, a leg, or the face. The injury may involve one nerve or a small group of nerves. Focal neuropathy can be painful and unpredictable, and occurs most often in older adults with diabetes. This often develops suddenly, but usually improves over time and does not cause long-term problems.  Proximal neuropathy. This type of nerve damage affects the nerves of the thighs, hips, buttocks, or legs. It causes severe pain, weakness, and muscle death (atrophy), usually in the thigh muscles. It is more common among older men and people who have type 2 diabetes. The length of recovery time may vary. What are the causes? Peripheral, autonomic, and focal neuropathies are caused by diabetes that is not well controlled with treatment. The cause of proximal neuropathy is not known, but it may be caused by inflammation related to uncontrolled blood  glucose levels. What are the signs or symptoms? Peripheral neuropathy Peripheral neuropathy develops slowly over time. When the nerves of the feet and legs no longer work, you may experience:  Burning, stabbing, or aching pain in the legs or feet.  Pain or cramping in the legs or feet.  Loss of feeling (numbness) and inability to feel pressure or pain in the feet. This can lead to: ? Thick calluses or sores on areas of constant pressure. ? Ulcers. ? Reduced ability to feel temperature changes.  Foot deformities.  Muscle weakness.  Loss of balance or coordination. Autonomic neuropathy The symptoms of autonomic neuropathy vary depending on which nerves are affected. Symptoms may include:  Problems with digestion, such as: ? Nausea or vomiting. ? Poor appetite. ? Bloating. ? Diarrhea or constipation. ? Trouble swallowing. ? Losing weight without trying to.  Problems with the heart, blood and lungs, such as: ? Dizziness, especially when standing up. ? Fainting. ? Shortness of breath. ? Irregular heartbeat.  Bladder problems, such as: ? Trouble starting or stopping urination. ? Leaking urine. ? Trouble emptying the bladder. ? Urinary tract infections (UTIs).  Problems with other body functions, such as: ? Sweat. You may sweat too much or too little. ? Temperature. You might get hot easily. Or, you might feel cold more than usual. ? Sexual function. Men may not be able to get or maintain an erection. Women may have vaginal dryness and difficulty with arousal. Focal neuropathy Symptoms affect only one area of the body. Common symptoms include:  Numbness.  Tingling.  Burning pain.  Prickling feeling.  Very sensitive skin.  Weakness.  Inability to move (paralysis).  Muscle twitching.  Muscles getting smaller (wasting).  Poor coordination.  Double or blurred vision. Proximal neuropathy  Sudden, severe pain in the hip, thigh, or buttocks. Pain may spread  from the back into the legs (sciatica).  Pain and numbness in the arms and legs.  Tingling.  Loss of bladder control or bowel control.  Weakness and wasting of thigh muscles.  Difficulty getting up from a seated position.  Abdominal swelling.  Unexplained weight loss. How is this diagnosed? Diagnosis usually involves reviewing your medical history and any symptoms you have. Diagnosis varies depending on the type of neuropathy your health care provider suspects. Peripheral neuropathy Your health care provider will check areas that are affected by your nervous system (neurologic exam), such as your reflexes, how you move, and what you can feel. You may have other tests, such as:  Blood tests.  Removal and examination of fluid that surrounds the spinal cord (lumbar puncture).  CT scan.  MRI.  A test to check the nerves that control muscles (electromyogram, EMG).  Tests of how quickly messages pass through your nerves (nerve conduction velocity tests).  Removal of a small piece of nerve to be examined under a microscope (biopsy). Autonomic neuropathy You may have tests, such as:  Tests to measure your blood pressure and heart rate. This may include monitoring you while you are safely secured to an exam table that moves you from a lying position to an upright position (table tilt test).  Breathing tests to check your lungs.  Tests to check how food moves through the digestive system (gastric emptying tests).  Blood, sweat, or urine tests.  Ultrasound of your bladder.  Spinal fluid tests. Focal neuropathy This condition may be diagnosed with:  A neurologic exam.  CT scan.  MRI.  EMG.  Nerve conduction velocity tests. Proximal neuropathy There is no test to diagnose this type of neuropathy. You may have tests to rule out other possible causes of this type of neuropathy. Tests may include:  X-rays of your spine and lumbar region.  Lumbar puncture.  MRI. How  is this treated? The goal of treatment is to keep nerve damage from getting worse. The most important part of treatment is keeping your blood glucose level and your A1C level within your target range by following your diabetes management plan. Over time, maintaining lower blood glucose levels helps lessen symptoms. In some cases, you may need prescription pain medicine. Follow these instructions at home:  Lifestyle   Do not use any products that contain nicotine or tobacco, such as cigarettes and e-cigarettes. If you need help quitting, ask your health care provider.  Be physically active every day. Include strength training and balance exercises.  Follow a healthy meal plan.  Work with your health care provider to manage your blood pressure. General instructions  Follow your diabetes management plan as directed. ? Check your blood glucose levels as directed by your health care provider. ? Keep your blood glucose in your target range as directed by your health care provider. ? Have your A1C level checked at least two times a year, or as often as told by your health care provider.  Take over the counter and prescription medicines only as told by your health care provider. This includes insulin and diabetes medicine.  Do not drive or use heavy machinery while taking prescription pain medicines.  Check your skin and feet every day for cuts, bruises, redness, blisters, or sores.  Keep all follow up visits as told by your  health care provider. This is important. Contact a health care provider if:  You have burning, stabbing, or aching pain in your legs or feet.  You are unable to feel pressure or pain in your feet.  You develop problems with digestion, such as: ? Nausea. ? Vomiting. ? Bloating. ? Constipation. ? Diarrhea. ? Abdominal pain.  You have difficulty with urination, such as inability: ? To control when you urinate (incontinence). ? To completely empty the bladder  (retention).  You have palpitations.  You feel dizzy, weak, or faint when you stand up. Get help right away if:  You cannot urinate.  You have sudden weakness or loss of coordination.  You have trouble speaking.  You have pain or pressure in your chest.  You have an irregular heart beat.  You have sudden inability to move a part of your body. Summary  Diabetic neuropathy refers to nerve damage that is caused by diabetes. It can affect nerves throughout the entire body, causing numbness and pain in the arms, legs, digestive tract, heart, and other body systems.  Keep your blood glucose level and your blood pressure in your target range, as directed by your health care provider. This can help prevent neuropathy from getting worse.  Check your skin and feet every day for cuts, bruises, redness, blisters, or sores.  Do not use any products that contain nicotine or tobacco, such as cigarettes and e-cigarettes. If you need help quitting, ask your health care provider. This information is not intended to replace advice given to you by your health care provider. Make sure you discuss any questions you have with your health care provider. Document Released: 12/19/2001 Document Revised: 11/22/2017 Document Reviewed: 11/14/2016 Elsevier Patient Education  [place]? An infection that lies within the keratin of your nail plate that is caused by a fungus.  WHY ME? Fungal infections affect all ages, sexes, races, and creeds.  There may be many factors that predispose you to a fungal infection such as age, coexisting medical conditions such as diabetes, or an autoimmune disease; stress, medications, fatigue, genetics, etc.  Bottom line: fungus thrives in a warm, moist environment and your shoes offer such a location.  IS IT CONTAGIOUS? Theoretically, yes.  You do not want to share shoes, nail clippers or files with someone who has fungal  toenails.  Walking around barefoot in the same room or sleeping in the same bed is unlikely to transfer the organism.  It is important to realize, however, that fungus can spread easily from one nail to the next on the same foot.  HOW DO WE TREAT THIS?  There are several ways to treat this condition.  Treatment may depend on many factors such as age, medications, pregnancy, liver and kidney conditions, etc.  It is best to ask your doctor which options are available to you.  1. No treatment.   Unlike many other medical concerns, you can live with this condition.  However for many people this can be a painful condition and may lead to ingrown toenails or a bacterial infection.  It is recommended that you keep the nails cut short to help reduce the amount of fungal nail. 2. Topical treatment.  These range from herbal remedies to prescription strength nail lacquers.  About 40-50% effective, topicals require twice daily application for approximately 9 to 12 months or until an entirely new nail has grown out.  The most effective topicals are medical grade medications  available through physicians offices. 3. Oral antifungal medications.  With an 80-90% cure rate, the most common oral medication requires 3 to 4 months of therapy and stays in your system for a year as the new nail grows out.  Oral antifungal medications do require blood work to make sure it is a safe drug for you.  A liver function panel will be performed prior to starting the medication and after the first month of treatment.  It is important to have the blood work performed to avoid any harmful side effects.  In general, this medication safe but blood work is required. 4. Laser Therapy.  This treatment is performed by applying a specialized laser to the affected nail plate.  This therapy is noninvasive, fast, and non-painful.  It is not covered by insurance and is therefore, out of pocket.  The results have been very good with a 80-95% cure rate.   [facility] is the only practice in the area to offer this therapy. 5. Permanent Nail Avulsion.  Removing the entire nail so that a new nail will not grow back.

## 2019-07-24 NOTE — Progress Notes (Signed)
Subjective:  Jamie Burke presents to clinic today for preventative diabetic foot care with cc of  painful, thick, discolored, elongated toenails 1-5 b/l that become tender and cannot cut because of thickness. Pain is aggravated when wearing enclosed shoe gear.  Donald Prose, MD is her PCP.   Current Outpatient Medications on File Prior to Visit  Medication Sig Dispense Refill  . ACCU-CHEK AVIVA PLUS test strip     . amoxicillin (AMOXIL) 500 MG capsule TAKE 1 CAPSULE BY MOUTH THREE TIMES DAILY FOR DENTAL INFECTION    . hydrochlorothiazide (HYDRODIURIL) 25 MG tablet hydrochlorothiazide 25 mg tablet    . ibuprofen (ADVIL,MOTRIN) 200 MG tablet Take 200 mg by mouth every 6 (six) hours as needed for pain.    [name] insulin lispro protamine-lispro (HUMALOG 75/25) (75-25) 100 UNIT/ML SUSP Inject 40-50 Units into the skin 2 (two) times daily with a meal.    . metFORMIN (GLUCOPHAGE-XR) 500 MG 24 hr tablet TAKE 1 TABLET BY MOUTH ONCE DAILY WITH EVENING MEAL FOR 90 DAYS    . omeprazole (PRILOSEC) 20 MG capsule Take 20 mg by mouth daily as needed.    . pravastatin (PRAVACHOL) 20 MG tablet pravastatin 20 mg tablet     No current facility-administered medications on file prior to visit.      Allergies  Allergen Reactions  . Lisinopril Swelling  . Sumatriptan Other (See Comments)    Very crazy dreams   . Sulfa Antibiotics Rash   Objective:  Physical Examination:  Vascular Examination: Capillary refill time immediate x 10 digits.  Palpable DP/PT pulses b/l.  Digital hair present b/l.  No edema noted b/l.  Skin temperature gradient WNL b/l.  Dermatological Examination: Skin with normal turgor, texture and tone b/l.  No open wounds b/l.  No interdigital macerations noted b/l.  Elongated, thick, discolored brittle toenails with subungual debris and pain on dorsal palpation of nailbeds 1-5 b/l.  Musculoskeletal Examination: Muscle strength 5/5 to all muscle groups b/l.  HAV with  bunion b/l.  No pain, crepitus or joint discomfort with active/passive ROM.  Neurological Examination: Sensation intact 5/5 b/l with 10 gram monofilament.  Vibratory sensation intact b/l.  Proprioceptive sensation intact b/l.  Assessment: Mycotic nail infection with pain 1-5 b/l  Plan: 1. Toenails 1-5 b/l were debrided in length and girth without iatrogenic laceration. 2.  Continue soft, supportive shoe gear daily. Recommended New Balance sneakers or [employer] approved sneakers. 3.  Report any pedal injuries to medical professional. 4.  Follow up 3 months. 5.  Patient/POA to call should there be a question/concern in there interim.

## 2019-09-04 ENCOUNTER — Ambulatory Visit (INDEPENDENT_AMBULATORY_CARE_PROVIDER_SITE_OTHER): Payer: Medicare Other | Admitting: Podiatry

## 2019-09-04 ENCOUNTER — Other Ambulatory Visit: Payer: Self-pay

## 2019-09-04 ENCOUNTER — Encounter: Payer: Self-pay | Admitting: Podiatry

## 2019-09-04 ENCOUNTER — Ambulatory Visit (INDEPENDENT_AMBULATORY_CARE_PROVIDER_SITE_OTHER): Payer: Medicare Other

## 2019-09-04 DIAGNOSIS — M2021 Hallux rigidus, right foot: Secondary | ICD-10-CM | POA: Diagnosis not present

## 2019-09-04 DIAGNOSIS — M7752 Other enthesopathy of left foot: Secondary | ICD-10-CM | POA: Diagnosis not present

## 2019-09-04 DIAGNOSIS — M2022 Hallux rigidus, left foot: Secondary | ICD-10-CM | POA: Diagnosis not present

## 2019-09-04 DIAGNOSIS — M779 Enthesopathy, unspecified: Secondary | ICD-10-CM

## 2019-09-04 DIAGNOSIS — M2020 Hallux rigidus, unspecified foot: Secondary | ICD-10-CM

## 2019-09-04 DIAGNOSIS — M6740 Ganglion, unspecified site: Secondary | ICD-10-CM

## 2019-09-04 DIAGNOSIS — R2681 Unsteadiness on feet: Secondary | ICD-10-CM

## 2019-09-04 NOTE — Progress Notes (Signed)
Subjective:   Patient ID: Jamie Burke, female   DOB: 73 y.o.   MRN: [id number]   HPI Patient presents stating she is concerned about some balance stability issues and on the right third digit there is been a nodule that is formed that is painful when she wear shoes and she is having quite a bit of pain on the top of her left foot.  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  I did a proximal nerve block right third digit and using sterile instrumentation I did aspirate getting out a small amount of gelatinous fluid and then applied compression and for the dorsal tendon complex left I did sterile prep and injected the tendon complex extensor 3 mg Dexasone Kenalog 5 mg Xylocaine and advised on gel treatment as needed.  Reappoint and the consideration for balance bracing will be present  X-rays indicate that there is no signs of fracture with no signs of arthritis of the distal to phalangeal joint digit 3 right

## 2019-12-23 ENCOUNTER — Other Ambulatory Visit: Payer: Self-pay

## 2019-12-25 ENCOUNTER — Encounter: Payer: Self-pay | Admitting: Internal Medicine

## 2019-12-25 ENCOUNTER — Other Ambulatory Visit: Payer: Self-pay

## 2019-12-25 ENCOUNTER — Ambulatory Visit (INDEPENDENT_AMBULATORY_CARE_PROVIDER_SITE_OTHER): Payer: Medicare Other | Admitting: Internal Medicine

## 2019-12-25 VITALS — BP 132/84 | HR 86 | Temp 98.3°F | Ht 64.0 in | Wt 225.2 lb

## 2019-12-25 DIAGNOSIS — E1165 Type 2 diabetes mellitus with hyperglycemia: Secondary | ICD-10-CM

## 2019-12-25 DIAGNOSIS — Z794 Long term (current) use of insulin: Secondary | ICD-10-CM | POA: Diagnosis not present

## 2019-12-25 DIAGNOSIS — E1142 Type 2 diabetes mellitus with diabetic polyneuropathy: Secondary | ICD-10-CM

## 2019-12-25 DIAGNOSIS — E785 Hyperlipidemia, unspecified: Secondary | ICD-10-CM | POA: Insufficient documentation

## 2019-12-25 MED ORDER — INSULIN PEN NEEDLE 32G X 4 MM MISC: 1.0000 | Freq: Two times a day (BID) | 11 refills | Status: DC

## 2019-12-25 MED ORDER — INSULIN LISPRO PROT & LISPRO (75-25 MIX) 100 UNIT/ML KWIKPEN: 56.0000 [IU] | PEN_INJECTOR | Freq: Two times a day (BID) | SUBCUTANEOUS | 4 refills | Status: DC

## 2019-12-25 NOTE — Progress Notes (Signed)
Name: Jamie Burke  MRN/ DOB: [id number], July 13, 1946   Age/ Sex: 74 y.o., female    PCP: Donald Prose, MD   Reason for Endocrinology Evaluation: Type 2 Diabetes Mellitus     Date of Initial Endocrinology Visit: 12/25/2019     PATIENT IDENTIFIER: Jamie Burke is a 74 y.o. female with a past medical history of T2Dm and HTN. The patient presented for initial endocrinology clinic visit on 12/25/2019 for consultative assistance with her diabetes management.    HPI: Jamie Burke was    Diagnosed with DM > 20 yr Prior Medications tried/Intolerance: metformin- weakness in the legs and incontinence, invokana - weakness in the legs. Victoza , Trulicity , she does not recall any side effects  Currently checking blood sugars 2x / day,  before breakfast and dinner  Hypoglycemia episodes : no          Hemoglobin A1c has ranged from 8.6% in 2012, peaking at 8.9% in 2021. Patient required assistance for hypoglycemia: no  Patient has required hospitalization within the last 1 year from hyper or hypoglycemia: no   In terms of diet, the patient  Eats 3 meals , a bunch of snacks. Avoids sugar-sweetened beverages    Has recurrent UTI    She works on a school bus   [place]: Humalog Mix 30-40 ( lately 48) units QAM, 60 units QPM  Metformin 500 mg XR daily - not taking    Statin: Yes-  Not taking  ACE-I/ARB:No Prior Diabetic Education: Years ago    METER DOWNLOAD SUMMARY: Date range evaluated: 11/26/2019-12/25/2019 Fingerstick Blood Glucose Tests = 65 Average Number Tests/Day =2.2 Overall Mean FS Glucose = 188 Standard Deviation = 45  BG Ranges: Low = 75 High = 335   Hypoglycemic Events/30 Days: BG < 50 = 0 Episodes of symptomatic severe hypoglycemia = 0   DIABETIC COMPLICATIONS: Microvascular complications:    Denies: CKD, retinopathy , neuropathy   Last eye exam: Completed 2019  Macrovascular complications:    Denies: CAD, PVD, CVA   PAST  HISTORY: Past Medical History:  Past Medical History:  Diagnosis Date  . Diabetes mellitus   . Dysplastic nevi   . Gastroparesis   . GERD (gastroesophageal reflux disease)   . Hypertension   . Melanoma [facility])    Past Surgical History:  Past Surgical History:  Procedure Laterality Date  . ABDOMINAL HYSTERECTOMY    . CARPAL TUNNEL RELEASE     BL  . TRIGGER FINGER RELEASE        Social History:  reports that she has quit smoking. She quit after 2.00 years of use. She has never used smokeless tobacco. She reports that she does not drink alcohol or use drugs. Family History:  Family History  Problem Relation Age of Onset  . Diabetes Mother   . Heart disease Mother   . Hypertension Mother   . Stroke Mother   . Hypertension Father   . Stroke Father   . Cancer Brother 19       Lung Cancer  . Kidney disease Brother      HOME MEDICATIONS: Allergies as of 12/25/2019      Reactions   Lisinopril Swelling   Sumatriptan Other (See Comments)   Very crazy dreams   Sulfa Antibiotics Rash      Medication List       Accurate as of December 25, 2019  9:46 AM. If you have any questions, ask your nurse or doctor.  STOP taking these medications   amoxicillin 500 MG capsule Commonly known as: AMOXIL Stopped by: Dorita Sciara, MD   metFORMIN 500 MG 24 hr tablet Commonly known as: GLUCOPHAGE-XR Stopped by: Dorita Sciara, MD     TAKE these medications   Accu-Chek Aviva Plus test strip Generic drug: glucose blood 2-3 times daily   hydrochlorothiazide 25 MG tablet Commonly known as: HYDRODIURIL hydrochlorothiazide 25 mg tablet   ibuprofen 200 MG tablet Commonly known as: ADVIL Take 200 mg by mouth every 6 (six) hours as needed for pain.   insulin lispro protamine-lispro (75-25) 100 UNIT/ML Susp injection Commonly known as: HUMALOG 75/25 MIX Inject 40-50 Units into the skin 2 (two) times daily with a meal.   omeprazole 20 MG capsule Commonly known as:  PRILOSEC Take 20 mg by mouth daily as needed.   pravastatin 20 MG tablet Commonly known as: PRAVACHOL pravastatin 20 mg tablet        ALLERGIES: Allergies  Allergen Reactions  . Lisinopril Swelling  . Sumatriptan Other (See Comments)    Very crazy dreams   . Sulfa Antibiotics Rash     REVIEW OF SYSTEMS: A comprehensive ROS was conducted with the patient and is negative except as per HPI and below:  Review of Systems  Gastrointestinal: Positive for constipation. Negative for nausea.  Genitourinary: Positive for frequency.  Neurological: Negative for tingling.  Endo/Heme/Allergies: Positive for polydipsia.      OBJECTIVE:   VITAL SIGNS: BP 132/84 (BP Location: Left Arm, Patient Position: Sitting, Cuff Size: Large)   Pulse 86   Temp 98.3 F (36.8 C)   Ht 5\' 4"  (1.626 m)   Wt 225 lb 3.2 oz (102.2 kg)   SpO2 96%   BMI 38.66 kg/m    PHYSICAL EXAM:  General: Pt appears well and is in NAD  Neck: General: Supple without adenopathy or carotid bruits. Thyroid: Thyroid size normal.  No goiter or nodules appreciated. No thyroid bruit.  Lungs: Clear with good BS bilat with no rales, rhonchi, or wheezes  Heart: RRR with normal S1 and S2 and no gallops; no murmurs; no rub  Abdomen: Normoactive bowel sounds, soft, nontender, without masses or organomegaly palpable  Extremities:  Lower extremities - No pretibial edema. No lesions.  Skin: Normal texture and temperature to palpation.   Neuro: MS is good with appropriate affect, pt is alert and Ox3    DM foot exam: 12/25/2019  The skin of the feet is intact without sores or ulcerations. The pedal pulses are 2+ on right and 2+ on left. The sensation is absent at the heels to a screening 5.07, 10 gram monofilament bilaterally   DATA REVIEWED:  Lab Results  Component Value Date   HGBA1C 8.6 03/15/2011   Lab Results  Component Value Date   CREATININE 0.66 03/11/2013    Lab Results  Component Value Date   LDLDIRECT  137 (H) 03/15/2011       08/22/2019  A1c 8.9% BUN/Cr 10/0.78  GFR 88 Ma/Cr ratio < 10.9 ASSESSMENT / PLAN / RECOMMENDATIONS:   1) Type 2 Diabetes Mellitus, Poorly controlled, With neuropathic complications - Most recent A1c of 9.2 %. Goal A1c <7.0 %.    Plan: GENERAL: I have discussed with the patient the pathophysiology of diabetes. We went over the natural progression of the disease. We talked about both insulin resistance and insulin deficiency. We stressed the importance of lifestyle changes including diet and exercise. I explained the complications associated with diabetes including retinopathy,  nephropathy, neuropathy as well as increased risk of cardiovascular disease. We went over the benefit seen with glycemic control.    I explained to the patient that diabetic patients are at higher than normal risk for amputations.   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 We did discuss the pharmacokinetics of insulin as well as reviewed the rule of 15 for hypoglycemia  I have advised the pt to avoid sugar- sweetened beverages and to avoid snacks   We have discussed add-on therapy , but she is intolerant to metformin, invokana , she believes she may have had an adverse reaction to GLP-1 agonists but she can not recall.    MEDICATIONS: - Humalog Mix 56 units with Breakfast and 56 units with Supper   EDUCATION / INSTRUCTIONS:  BG monitoring instructions: Patient is instructed to check her blood sugars 2 times a day, before breakfast and supper.  Call [facility] if: BG persistently < 70 or > 300. . I reviewed the Rule of 15 for the treatment of hypoglycemia in detail with the patient. Literature supplied.   2) Diabetic complications:   Eye: Does not have known diabetic retinopathy.   Neuro/ Feet: Does have known diabetic peripheral neuropathy based on her  Exam   Renal:  Patient does not have known baseline CKD. She is not on an ACEI/ARB at present.C She is allergic to lisinopril    3) Dyslipidemia : Patient is not taking pravastatin, stating it made her ill. We discussed the cardiovascular benefits of statins, pt is open to trying statin again but will discuss this further on the next visit. In the meantime , I want  Her to start OTC Vitamin D3 1000 iu daily.  Given her c/o leg weakness with some medications, I would like to start her on vitamin D first.    F/u in 3 months      Signed electronically by: Mack Guise, MD  [facility] Endocrinology  [facility] [address] [place], [place] [address] Phone: [phone] FAX: [phone]   CC: Donald Prose, [place] [address] Phone: [phone]  Fax: [phone]    Return to Endocrinology clinic as below: Future Appointments  Date Time Provider [place]  01/09/2020 11:00 AM Leota Sauers, RD [place] NDM

## 2019-12-25 NOTE — Patient Instructions (Addendum)
-   Humalog Mix 56 units with Breakfast and 56 units with Supper   - Start taking Vitamin D3 1000 iu daily ( over the counter)   - Choose healthy, lower carb lower calorie snacks: toss salad, cooked vegetables, cottage cheese, peanut butter, low fat cheese / string cheese, lower sodium deli meat, tuna salad or chicken salad     HOW TO TREAT LOW BLOOD SUGARS (Blood sugar LESS THAN 70 MG/DL)  Please follow the RULE OF 15 for the treatment of hypoglycemia treatment (when your (blood sugars are less than 70 mg/dL)    STEP 1: Take 15 grams of carbohydrates when your blood sugar is low, which includes:   3-4 GLUCOSE TABS  OR  3-4 OZ OF JUICE OR REGULAR SODA OR  ONE TUBE OF GLUCOSE GEL     STEP 2: RECHECK blood sugar in 15 MINUTES STEP 3: If your blood sugar is still low at the 15 minute recheck --> then, go back to STEP 1 and treat AGAIN with another 15 grams of carbohydrates.

## 2020-01-03 ENCOUNTER — Other Ambulatory Visit: Payer: Self-pay

## 2020-01-03 ENCOUNTER — Telehealth: Payer: Self-pay | Admitting: Internal Medicine

## 2020-01-03 MED ORDER — ACCU-CHEK FASTCLIX LANCETS MISC: 12 refills | Status: DC

## 2020-01-03 MED ORDER — ACCU-CHEK AVIVA PLUS VI STRP: ORAL_STRIP | 11 refills | Status: DC

## 2020-01-03 NOTE — Telephone Encounter (Signed)
Patient calling requesting a refill for the ACCU-CHEK AVIVA PLUS test strip and requests to add [facility] pharmacy to her list. Patient asks if this RX can be sent through [facility]. Patient ph# [phone]

## 2020-01-03 NOTE — Telephone Encounter (Signed)
done

## 2020-01-09 ENCOUNTER — Encounter: Payer: Medicare Other | Attending: Internal Medicine | Admitting: Registered"

## 2020-01-22 ENCOUNTER — Telehealth: Payer: Self-pay

## 2020-01-22 NOTE — Telephone Encounter (Signed)
Patient called and states that she can not get her sugar down below 200.  She is taking her medication that Dr Kelton Pillar gave her and still has not come down for that past 3-4 days.  Please advise?

## 2020-01-22 NOTE — Telephone Encounter (Signed)
Please find out what time of the day her blood sugar is high, how much insulin she is taking and what medication she is referring to

## 2020-01-22 NOTE — Telephone Encounter (Signed)
Patient is taking Humalog 25/75 56 units morning and 56 units at bedtime.  Morning sugar was 207  evening sugar was 235.  She states that has not been under 180 this week.

## 2020-01-22 NOTE — Telephone Encounter (Signed)
She will take 64 units at [place]

## 2020-01-23 NOTE — Telephone Encounter (Signed)
Patient notified of change and states that she will try that.

## 2020-02-13 ENCOUNTER — Other Ambulatory Visit: Payer: Self-pay

## 2020-02-13 ENCOUNTER — Encounter: Payer: Self-pay | Admitting: Registered"

## 2020-02-13 ENCOUNTER — Encounter: Payer: Medicare Other | Attending: Internal Medicine | Admitting: Registered"

## 2020-02-13 DIAGNOSIS — E119 Type 2 diabetes mellitus without complications: Secondary | ICD-10-CM | POA: Diagnosis present

## 2020-02-13 DIAGNOSIS — E1165 Type 2 diabetes mellitus with hyperglycemia: Secondary | ICD-10-CM

## 2020-02-13 NOTE — Progress Notes (Signed)
Diabetes Self-Management Education  Visit Type: First/Initial  Appt. Start Time: 1100 Appt. End Time: 1215  02/13/2020  [name], identified by name and date of birth, is a 74 y.o. female with a diagnosis of Diabetes: Type 2.   ASSESSMENT  Height 5' 4.5" (1.638 m), weight 225 lb 6.4 oz (102.2 kg). Body mass index is 38.09 kg/m.   Pt reports she is married and has kids but they are not very helpful with managing her diabetes. Reports main concern is what to eat. Reports it has been hard over past year and with not feeling safe to go to gym to exercise. Reports she could walk in neighborhood.   Pt reports she takes 52 units Humalog twice daily. Reports increased weight gain with increased insulin per pt. Next A1c will be in June per pt. Checks blood sugar fasting and in afternoon before dinner. Fasting: 156, 160s. Reports she made brownies last weekend and feels that is why she saw higher numbers for fasting. Reports 129,134 readings during the day. Pt doesn't check after meals. Reports she had a couple lows earlier this week: 92, in 84s and started feeling shaky, weak. Pt reports she will eat something, drink soda or take glucose tablets if she has a low blood sugar.   Pt reports eating dry cereal, fruit, cookies as snack. Usually has 3 meals per day.  Pt wakes at 430 AM, breakfast around 5 AM. Reports feeling still hungry after dinner. Reports this is not new but has been ongoing.   Pt works as a Recruitment consultant.   Reports having constipation. Reports she is supposed to take Benefiber but doesn't do it daily. Reports beef does not digest well, causes constipation. Pt does not like mushrooms.   Pt had to leave appointment 15 early due to work schedule.   Diabetes Self-Management Education - 02/13/20 1106      Visit Information   Visit Type  First/Initial      Initial Visit   Diabetes Type  Type 2    Are you currently following a meal plan?  No    Are you taking your  medications as prescribed?  Yes    Date Diagnosed  Over 25 years ago      Health Coping   How would you rate your overall health?  Poor      Psychosocial Assessment   Patient Belief/Attitude about Diabetes  Defeat/Burnout    Self-care barriers  Hard of hearing    Self-management support  None    Other persons present  Patient    Patient Concerns  Nutrition/Meal planning    Special Needs  Unable to determine    Preferred Learning Style  No preference indicated    Learning Readiness  Ready    How often do you need to have someone help you when you read instructions, pamphlets, or other written materials from your doctor or pharmacy?  1 - Never    What is the last grade level you completed in school?  12th grade      Pre-Education Assessment   Patient understands the diabetes disease and treatment process.  Needs Instruction    Patient understands incorporating nutritional management into lifestyle.  Needs Instruction    Patient undertands incorporating physical activity into lifestyle.  Needs Instruction    Patient understands using medications safely.  Demonstrates understanding / competency    Patient understands monitoring blood glucose, interpreting and using results  Needs Instruction    Patient understands prevention,  detection, and treatment of acute complications.  Needs Instruction    Patient understands prevention, detection, and treatment of chronic complications.  Needs Instruction    Patient understands how to develop strategies to address psychosocial issues.  Needs Instruction    Patient understands how to develop strategies to promote health/change behavior.  Needs Instruction      Complications   Last HgB A1C per patient/outside source  8.9 %    How often do you check your blood sugar?  1-2 times/day    Fasting Blood glucose range (mg/dL)  130-179    Postprandial Blood glucose range (mg/dL)  --   Pt does not check postprandial.   Number of hypoglycemic episodes per  month  5    Can you tell when your blood sugar is low?  Yes    What do you do if your blood sugar is low?  eat something, take glucose tablets, or drink soda    Number of hyperglycemic episodes per week  2    Can you tell when your blood sugar is high?  --   sometimes feels really sleepy   Have you had a dilated eye exam in the past 12 months?  No    Have you had a dental exam in the past 12 months?  Yes    Are you checking your feet?  Yes    How many days per week are you checking your feet?  3      Dietary Intake   Breakfast  instant oatmeal peaches and cream, 1 cup coffee with vanilla creamer    Lunch  ~11 AM: platter with eggs, biscuit, grits, bacon, Diet Coke    Dinner  ~6 PM: scrambled egg, spam, piece of toast, Diet Coke, water    Snack (evening)  Apple Jacks dry cereal      Exercise   Exercise Type  ADL's    How many days per week to you exercise?  0    How many minutes per day do you exercise?  0    Total minutes per week of exercise  0      Patient Education   Previous Diabetes Education  Yes (please comment)   over 4 years ago; recent increase in blood sugar over past year   Disease state   Definition of diabetes, type 1 and 2, and the diagnosis of diabetes;Factors that contribute to the development of diabetes    Nutrition management   Food label reading, portion sizes and measuring food.;Carbohydrate counting;Role of diet in the treatment of diabetes and the relationship between the three main macronutrients and blood glucose level    Monitoring  Daily foot exams;Yearly dilated eye exam;Identified appropriate SMBG and/or A1C goals.;Interpreting lab values - A1C, lipid, urine microalbumina.;Purpose and frequency of SMBG.    Acute complications  Taught treatment of hypoglycemia - the 15 rule.;Discussed and identified patients' treatment of hyperglycemia.    Chronic complications  Retinopathy and reason for yearly dilated eye exams;Lipid levels, blood glucose control and heart  disease;Relationship between chronic complications and blood glucose control      Individualized Goals (developed by patient)   Nutrition  Follow meal plan discussed;General guidelines for healthy choices and portions discussed    Medications  take my medication as prescribed    Monitoring   test my blood glucose as discussed      Post-Education Assessment   Patient understands the diabetes disease and treatment process.  Demonstrates understanding / competency    Patient  understands incorporating nutritional management into lifestyle.  Demonstrates understanding / competency    Patient undertands incorporating physical activity into lifestyle.  Needs Instruction    Patient understands using medications safely.  Demonstrates understanding / competency    Patient understands monitoring blood glucose, interpreting and using results  Demonstrates understanding / competency    Patient understands prevention, detection, and treatment of acute complications.  Demonstrates understanding / competency    Patient understands prevention, detection, and treatment of chronic complications.  Demonstrates understanding / competency    Patient understands how to develop strategies to address psychosocial issues.  Demonstrates understanding / competency    Patient understands how to develop strategies to promote health/change behavior.  Demonstrates understanding / competency      Outcomes   Expected Outcomes  Demonstrated interest in learning. Expect positive outcomes    Future DMSE  --   Pt to call for f/u appointment.   Program Status  Not Completed   Will cover PA at f/u.      Individualized Plan for Diabetes Self-Management Training:   Learning Objective:  Patient will have a greater understanding of diabetes self-management. Patient education plan is to attend individual and/or group sessions per assessed needs and concerns.   Plan: Dietitian provided DSME. Will cover physical activity at  follow-up. Provided education on mindful eating. Encouraged follow up appointment.   Instructions/Goals:  Eat every 3-5 hours while awake.   Recommend 2-3 carbohydrate choices (30-45 g carbohydrates) at each meal. Staying consistent with intake at each meal is most important.   Recommend checking blood sugar 1 time 1-2 hours after a meal each day to see how food is affecting your blood sugar in addition to checking fasting blood sugar.   Snacks: 1 carbohydrate choice and 1 protein serving (see handout)  Practice Mindful Eating  If you feel that you are wanting to snack because your are bored or due to emotions and not because you are hungry, try to do a fun activity (read a book, take a walk, talk with a friend, etc.) for at least 20 minutes.   Foods to Try:  Whole grains: Triscuit crackers in place of regular crackers  Special K Protein Cinnamon Brown Sugar Crunch (good source of protein and fiber)    Patient Instructions  Instructions/Goals:  Eat every 3-5 hours while awake.   Recommend 2-3 carbohydrate choices (30-45 g carbohydrates) at each meal. Staying consistent with intake at each meal is most important.   Recommend checking blood sugar 1 time 1-2 hours after a meal each day to see how food is affecting your blood sugar in addition to checking fasting blood sugar.   Snacks: 1 carbohydrate choice and 1 protein serving (see handout)  Practice Mindful Eating  If you feel that you are wanting to snack because your are bored or due to emotions and not because you are hungry, try to do a fun activity (read a book, take a walk, talk with a friend, etc.) for at least 20 minutes.   Foods to Try:  Whole grains: Triscuit crackers in place of regular crackers  Special K Protein Cinnamon Brown Sugar Crunch (good source of protein and fiber)       Expected Outcomes:  Demonstrated interest in learning. Expect positive outcomes  Education material provided: ADA - How to  Thrive: A Guide for Your Journey with Diabetes, Meal plan card, My Plate and Snack sheet  If problems or questions, patient to contact team via:  Phone and Email  Future DSME appointment: (Pt to call for f/u appointment.)

## 2020-02-13 NOTE — Patient Instructions (Signed)
Instructions/Goals:  Eat every 3-5 hours while awake.   Recommend 2-3 carbohydrate choices (30-45 g carbohydrates) at each meal. Staying consistent with intake at each meal is most important.   Recommend checking blood sugar 1 time 1-2 hours after a meal each day to see how food is affecting your blood sugar in addition to checking fasting blood sugar.   Snacks: 1 carbohydrate choice and 1 protein serving (see handout)  Practice Mindful Eating  If you feel that you are wanting to snack because your are bored or due to emotions and not because you are hungry, try to do a fun activity (read a book, take a walk, talk with a friend, etc.) for at least 20 minutes.   Foods to Try:  Whole grains: Triscuit crackers in place of regular crackers  Special K Protein Cinnamon Brown Sugar Crunch (good source of protein and fiber)

## 2020-03-25 ENCOUNTER — Other Ambulatory Visit: Payer: Self-pay

## 2020-03-27 ENCOUNTER — Ambulatory Visit: Payer: Medicare Other | Admitting: Internal Medicine

## 2020-03-27 ENCOUNTER — Other Ambulatory Visit: Payer: Self-pay

## 2020-03-27 ENCOUNTER — Encounter: Payer: Self-pay | Admitting: Internal Medicine

## 2020-03-27 VITALS — BP 124/78 | HR 91 | Ht 64.0 in | Wt 225.0 lb

## 2020-03-27 DIAGNOSIS — E1142 Type 2 diabetes mellitus with diabetic polyneuropathy: Secondary | ICD-10-CM

## 2020-03-27 DIAGNOSIS — Z794 Long term (current) use of insulin: Secondary | ICD-10-CM | POA: Diagnosis not present

## 2020-03-27 DIAGNOSIS — E1165 Type 2 diabetes mellitus with hyperglycemia: Secondary | ICD-10-CM

## 2020-03-27 LAB — POCT GLYCOSYLATED HEMOGLOBIN (HGB A1C): Hemoglobin A1C: 8.5 % — AB (ref 4.0–5.6)

## 2020-03-27 MED ORDER — INSULIN LISPRO PROT & LISPRO (75-25 MIX) 100 UNIT/ML KWIKPEN: PEN_INJECTOR | SUBCUTANEOUS | 4 refills | Status: DC

## 2020-03-27 NOTE — Patient Instructions (Addendum)
-   Humalog Mix 56 units with Breakfast and 50 units with Supper    Choose healthy, lower carb lower calorie snacks: toss salad,  vegetables, cottage cheese, peanut butter, low fat cheese / string cheese, lower sodium deli meat, tuna salad or chicken salad    HOW TO TREAT LOW BLOOD SUGARS (Blood sugar LESS THAN 70 MG/DL)  Please follow the RULE OF 15 for the treatment of hypoglycemia treatment (when your (blood sugars are less than 70 mg/dL)    STEP 1: Take 15 grams of carbohydrates when your blood sugar is low, which includes:   3-4 GLUCOSE TABS  OR  3-4 OZ OF JUICE OR REGULAR SODA OR  ONE TUBE OF GLUCOSE GEL     STEP 2: RECHECK blood sugar in 15 MINUTES STEP 3: If your blood sugar is still low at the 15 minute recheck --> then, go back to STEP 1 and treat AGAIN with another 15 grams of carbohydrates.

## 2020-03-27 NOTE — Progress Notes (Signed)
Name: Jamie Burke  Age/ Sex: 74 y.o., female   MRN/ DOB: [id number], October 30, 1945     PCP: Donald Prose, MD   Reason for Endocrinology Evaluation: Type 2 Diabetes Mellitus  Initial Endocrine Consultative Visit: 12/25/2019    PATIENT IDENTIFIER: [name] is a 74 y.o. female with a past medical history of T2DM and HTN. The patient has followed with Endocrinology clinic since 12/25/2019 for consultative assistance with management of her diabetes.  DIABETIC HISTORY:  Jamie Burke was diagnosed with DM > 20 yr ago. She has been on various  glycemic agents (metformin- weakness in the legs and incontinence, invokana - weakness in the legs. Victoza , Trulicity , she does not recall any side effects ). Her hemoglobin A1c has ranged from 8.6% in 2012, peaking at 8.9% in 2021.    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     SUBJECTIVE:   During the last visit (12/25/2019): A1c 9.2% She was taking Humalog mix only, we adjusted the dose, she was offered a GLP-1 agonist but was skeptical about it.    Today (03/27/2020): Jamie Burke is here for a follow up on diabetes management.  She checks her blood sugars 2 times daily. The patient has had hypoglycemic episodes since the last clinic visit, which typically occur 1 x /month - most often occuring fasting  The patient is  symptomatic with these episodes.    HOME DIABETES REGIMEN:  Humalog Mix 56 units with Breakfast and 56 units with Supper     METER DOWNLOAD SUMMARY: Date range evaluated: 5/6-03/27/2020 Fingerstick Blood Glucose Tests = 23 Average Number Tests/Day = 2.4 Overall Mean FS Glucose = 161 Standard Deviation = 42.3  BG Ranges: Low = 63 High = 278   Hypoglycemic Events/30 Days: BG < 50 = 0 Episodes of symptomatic severe  hypoglycemia = 0    DIABETIC COMPLICATIONS: Microvascular complications:    Denies: CKD, retinopathy , neuropathy   Last eye exam: Completed 2019  Macrovascular complications:    Denies: CAD, PVD, CVA    HISTORY:  Past Medical History:  Past Medical History:  Diagnosis Date  . Diabetes mellitus   . Dysplastic nevi   . Gastroparesis   . GERD (gastroesophageal reflux disease)   . Hypertension   . Melanoma [facility])    Past Surgical History:  Past Surgical History:  Procedure Laterality Date  . ABDOMINAL HYSTERECTOMY    . CARPAL TUNNEL RELEASE     BL  . TRIGGER FINGER RELEASE      Social History:  reports that she has quit smoking. She quit after 2.00 years of use. She has never used smokeless tobacco. She reports that she does not drink alcohol or use drugs. Family History:  Family History  Problem Relation Age of Onset  . Diabetes Mother   . Heart disease Mother   . Hypertension Mother   . Stroke Mother   . Hypertension Father   . Stroke Father   . Cancer Brother 40       Lung Cancer  . Kidney disease Brother      HOME MEDICATIONS: Allergies as of 03/27/2020      Reactions   Lisinopril Swelling   Sumatriptan Other (See Comments)   Very crazy  dreams   Sulfa Antibiotics Rash      Medication List       Accurate as of March 27, 2020  9:58 AM. If you have any questions, ask your nurse or doctor.        STOP taking these medications   metFORMIN 500 MG 24 hr tablet Commonly known as: GLUCOPHAGE-XR Stopped by: Dorita Sciara, MD     TAKE these medications   Accu-Chek Aviva Plus test strip Generic drug: glucose blood 3 times daily E11.65   Accu-Chek FastClix Lancets Misc Use as instructed 3 times daily E11.65   hydrochlorothiazide 25 MG tablet Commonly known as: HYDRODIURIL hydrochlorothiazide 25 mg tablet   ibuprofen 200 MG tablet Commonly known as: ADVIL Take 200 mg by mouth every 6 (six) hours as needed for pain.   Insulin Lispro  Prot & Lispro (75-25) 100 UNIT/ML Kwikpen Commonly known as: HumaLOG Mix 75/25 KwikPen Inject 56 Units into the skin daily with breakfast AND 50 Units daily with supper. What changed: See the new instructions. Changed by: Dorita Sciara, MD   Insulin Pen Needle 32G X 4 MM Misc 1 Device by Does not apply route 2 (two) times daily with a meal.   omeprazole 20 MG capsule Commonly known as: PRILOSEC Take 20 mg by mouth daily as needed.   pravastatin 20 MG tablet Commonly known as: PRAVACHOL pravastatin 20 mg tablet        OBJECTIVE:   Vital Signs: BP 124/78   Pulse 91   Ht 5\' 4"  (1.626 m)   Wt 225 lb (102.1 kg)   SpO2 98%   BMI 38.62 kg/m   Wt Readings from Last 3 Encounters:  03/27/20 225 lb (102.1 kg)  02/13/20 225 lb 6.4 oz (102.2 kg)  12/25/19 225 lb 3.2 oz (102.2 kg)     Exam: General: Pt appears well and is in NAD  Lungs: Clear with good BS bilat with no rales, rhonchi, or wheezes  Heart: RRR with normal S1 and S2 and no gallops; no murmurs; no rub  Extremities: No pretibial edema.   Neuro: MS is good with appropriate affect, pt is alert and Ox3    DM foot exam: 12/25/2019  The skin of the feet is intact without sores or ulcerations. The pedal pulses are 2+ on right and 2+ on left. The sensation is absent at the heels to a screening 5.07, 10 gram monofilament bilaterally    DATA REVIEWED:  Lab Results  Component Value Date   HGBA1C 8.5 (A) 03/27/2020   HGBA1C 8.6 03/15/2011   Lab Results  Component Value Date   CREATININE 0.66 03/11/2013    Lab Results  Component Value Date   LDLDIRECT 137 (H) 03/15/2011         ASSESSMENT / PLAN / RECOMMENDATIONS:   1) Type 2 Diabetes Mellitus, Poorly controlled, With Neuropathic complications - Most recent A1c of 8.5 %. Goal A1c <7.0 %.    - A1c down from 9.2%  - Pt with hypoglycemia in the morning and the occasional tight BG's, will adjust evening dose accordingly.  - Her Bg's fluctuate through  the day 80's to > 250's this is due to the occasional snacking, we discussed low carb options for snacks if needed.     MEDICATIONS:  Continue Humalog 56 units with breakfast and reduce to 50 units with supper   EDUCATION / INSTRUCTIONS:  BG monitoring instructions: Patient is instructed to check her blood sugars 2 times a day, before  breakfast and supper .  Call [place] [facility] if: BG persistently < 70 or > 300. . I reviewed the Rule of 15 for the treatment of hypoglycemia in detail with the patient. Literature supplied.    2) Diabetic complications:      Eye: Does not have known diabetic retinopathy.   Neuro/ Feet: Does have known diabetic peripheral neuropathy based on her  Exam   Renal: Patient does not have known baseline CKD. She is not on an ACEI/ARB at present.C She is allergic to lisinopril      F/U in 4 months    Signed electronically by: Mack Guise, MD  [facility] Endocrinology  [facility] [address] [place], [place] [address] Phone: [phone] FAX: [phone]   CC: Donald Prose, [place] [address] Phone: [phone]  Fax: [phone]  Return to Endocrinology clinic as below: Future Appointments  Date Time Provider [place]  07/31/2020  1:40 PM Biannca Scantlin, Melanie Crazier, MD [facility] None

## 2020-04-18 ENCOUNTER — Telehealth: Payer: Self-pay | Admitting: Internal Medicine

## 2020-04-18 NOTE — Telephone Encounter (Signed)
Pt called [facility] with hyperglycemia .   She received an intra-articular injection of steroid  yesterday morning .  Pre-injection Bg was 140 mg/dL , post injection BG 440 mg/dL , she took 60 units of Humalog mix last night, BG this AM 302 mg/dL   Pt received her evening humalog mix at 60 units ~ 1 hr ago.  Pt encouraged to stay hydrated, unfortunately there's no flexibility in the current insulin regimen but in the future she was advised to contact us prior to the steroid injection.  Pt will check BG in AM and will take 66 units if BG > 200 mg/dL , otherwise she will go back to her regular injuslin dose of 56 units QAM and 50 QPM  Pt expressed understanding  Abby Nena Jordan, MD  [facility] Endocrinology  [facility] [address] [place], [place] [address] Phone: [phone] FAX: [phone]

## 2020-05-15 ENCOUNTER — Other Ambulatory Visit: Payer: Self-pay

## 2020-05-15 ENCOUNTER — Telehealth: Payer: Self-pay | Admitting: Internal Medicine

## 2020-05-15 MED ORDER — ACCU-CHEK GUIDE ME W/DEVICE KIT: PACK | 0 refills | Status: DC

## 2020-05-15 MED ORDER — ACCU-CHEK GUIDE VI STRP: ORAL_STRIP | 12 refills | Status: DC

## 2020-05-15 NOTE — Telephone Encounter (Signed)
Spoke to pt, previous meter was no longer covered by insurance so changed meter and strips to what pt stated was covered and sent to pharmacy.

## 2020-05-15 NOTE — Telephone Encounter (Signed)
MEDICATION: Meter accucheck guide meter kit with extra lancets and test strips  PHARMACY:  [facility]  IS THIS A 90 DAY SUPPLY : yes for the lancet and test strip  IS PATIENT OUT OF MEDICATION: she does have about 2 weeks left with the old meter and strips  IF NOT; HOW MUCH IS LEFT:    OTHER COMMENTS:    **Let patient know to contact pharmacy at the end of the day to make sure medication is ready. **  ** Please notify patient to allow 48-72 hours to process**  **Encourage patient to contact the pharmacy for refills or they can request refills through [facility]**

## 2020-06-16 ENCOUNTER — Telehealth: Payer: Self-pay | Admitting: Internal Medicine

## 2020-06-16 NOTE — Telephone Encounter (Signed)
Patient wanted to ask Dr [facility] opinion on whether she recommends patient getting the covid vaccine. Ph# [phone]

## 2020-06-16 NOTE — Telephone Encounter (Signed)
Called pt and left detailed voicemail with MD message. Encouraged patient to return phone call if she has additional questions.

## 2020-07-17 ENCOUNTER — Other Ambulatory Visit: Payer: Self-pay

## 2020-07-17 ENCOUNTER — Encounter: Payer: Self-pay | Admitting: Internal Medicine

## 2020-07-17 ENCOUNTER — Ambulatory Visit (INDEPENDENT_AMBULATORY_CARE_PROVIDER_SITE_OTHER): Payer: Medicare Other | Admitting: Internal Medicine

## 2020-07-17 VITALS — BP 140/76 | HR 98 | Ht 64.0 in | Wt 226.4 lb

## 2020-07-17 DIAGNOSIS — E1165 Type 2 diabetes mellitus with hyperglycemia: Secondary | ICD-10-CM

## 2020-07-17 DIAGNOSIS — Z794 Long term (current) use of insulin: Secondary | ICD-10-CM | POA: Diagnosis not present

## 2020-07-17 DIAGNOSIS — E1142 Type 2 diabetes mellitus with diabetic polyneuropathy: Secondary | ICD-10-CM

## 2020-07-17 LAB — POCT GLYCOSYLATED HEMOGLOBIN (HGB A1C): Hemoglobin A1C: 8.5 % — AB (ref 4.0–5.6)

## 2020-07-17 MED ORDER — RYBELSUS 3 MG PO TABS: 3.0000 mg | ORAL_TABLET | Freq: Every day | ORAL | 6 refills | Status: DC

## 2020-07-17 NOTE — Progress Notes (Signed)
Name: Jamie Burke  Age/ Sex: 74 y.o., female   MRN/ DOB: [id number], 1946/08/29     PCP: Donald Prose, MD   Reason for Endocrinology Evaluation: Type 2 Diabetes Mellitus  Initial Endocrine Consultative Visit: 12/25/2019    PATIENT IDENTIFIER: Jamie Burke is a 74 y.o. female with a past medical history of T2DM and HTN. The patient has followed with Endocrinology clinic since 12/25/2019 for consultative assistance with management of her diabetes.  DIABETIC HISTORY:  Ms. Champine was diagnosed with DM > 20 yr ago. She has been on various  glycemic agents (metformin- weakness in the legs and incontinence, invokana - weakness in the legs. Victoza , Trulicity , she does not recall any side effects ). Her hemoglobin A1c has ranged from 8.6% in 2012, peaking at 8.9% in 2021.    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     SUBJECTIVE:   During the last visit (03/27/2020): A1c 8.5 % She was taking Humalog mix only, we adjusted the dose, she was offered a GLP-1 agonist but was skeptical about it.    Today (07/17/2020): Ms. Battaglia is here for a follow up on diabetes management.  She checks her blood sugars 2 times daily. The patient has had hypoglycemic episodes since the last clinic visit, which typically occur 1 x /month - most often occuring fasting  The patient is  symptomatic with these episodes.    She adjusts her insulin based on current reading which she understand    HOME DIABETES REGIMEN:  Humalog Mix 56 units with Breakfast and 50 units with Supper - 48 QAM and 52 units at night     METER DOWNLOAD SUMMARY: Date range evaluated: 9/11-9/24/2021 Average Number Tests/Day = 1.8 Overall Mean FS Glucose = 164 Standard Deviation = 34  BG Ranges: Low = 93 High =  231   Hypoglycemic Events/30 Days: BG < 50 = 0 Episodes of symptomatic severe hypoglycemia = 0    DIABETIC COMPLICATIONS: Microvascular complications:    Denies: CKD, retinopathy , neuropathy   Last eye exam: Completed 2019  Macrovascular complications:    Denies: CAD, PVD, CVA    HISTORY:  Past Medical History:  Past Medical History:  Diagnosis Date  . Diabetes mellitus   . Dysplastic nevi   . Gastroparesis   . GERD (gastroesophageal reflux disease)   . Hypertension   . Melanoma [facility])    Past Surgical History:  Past Surgical History:  Procedure Laterality Date  . ABDOMINAL HYSTERECTOMY    . CARPAL TUNNEL RELEASE     BL  . TRIGGER FINGER RELEASE      Social History:  reports that she has quit smoking. She quit after 2.00 years of use. She has never used smokeless tobacco. She reports that she does not drink alcohol and does not use drugs. Family History:  Family History  Problem Relation Age of Onset  . Diabetes Mother   . Heart disease Mother   . Hypertension Mother   . Stroke Mother   . Hypertension Father   . Stroke Father   . Cancer Brother 16       Lung Cancer  . Kidney disease Brother      HOME MEDICATIONS: Allergies as of 07/17/2020  Reactions   Lisinopril Swelling   Sumatriptan Other (See Comments)   Very crazy dreams   Sulfa Antibiotics Rash      Medication List       Accurate as of July 17, 2020 10:52 AM. If you have any questions, ask your nurse or doctor.        Accu-Chek FastClix Lancets Misc Use as instructed 3 times daily E11.65   Accu-Chek Guide Me w/Device Kit Use as directed 3 times daily to test blood sugar E11.65   Accu-Chek Guide test strip Generic drug: glucose blood Use as instructed to test blood sugar 3 times daily E11.65   hydrochlorothiazide 25 MG tablet Commonly known as: HYDRODIURIL hydrochlorothiazide 25 mg tablet   ibuprofen 200 MG tablet Commonly known as: ADVIL Take 200 mg by mouth  every 6 (six) hours as needed for pain.   Insulin Lispro Prot & Lispro (75-25) 100 UNIT/ML Kwikpen Commonly known as: HumaLOG Mix 75/25 KwikPen Inject 56 Units into the skin daily with breakfast AND 50 Units daily with supper.   Insulin Pen Needle 32G X 4 MM Misc 1 Device by Does not apply route 2 (two) times daily with a meal.   omeprazole 20 MG capsule Commonly known as: PRILOSEC Take 20 mg by mouth daily as needed.   pravastatin 20 MG tablet Commonly known as: PRAVACHOL pravastatin 20 mg tablet        OBJECTIVE:   Vital Signs: BP 140/76 (BP Location: Left Arm, Patient Position: Sitting, Cuff Size: Large)   Pulse 98   Ht _0  (1.626 m)   Wt 226 lb 6.4 oz (102.7 kg)   SpO2 96%   BMI 38.86 kg/m   Wt Readings from Last 3 Encounters:  07/17/20 226 lb 6.4 oz (102.7 kg)  03/27/20 225 lb (102.1 kg)  02/13/20 225 lb 6.4 oz (102.2 kg)     Exam: General: Pt appears well and is in NAD  Lungs: Clear with good BS bilat with no rales, rhonchi, or wheezes  Heart: RRR with normal S1 and S2 and no gallops; no murmurs; no rub  Extremities: No pretibial edema.   Neuro: MS is good with appropriate affect, pt is alert and Ox3    DM foot exam: 12/25/2019  The skin of the feet is intact without sores or ulcerations. The pedal pulses are 2+ on right and 2+ on left. The sensation is absent at the heels to a screening 5.07, 10 gram monofilament bilaterally    DATA REVIEWED:  Lab Results  Component Value Date   HGBA1C 8.5 (A) 07/17/2020   HGBA1C 8.5 (A) 03/27/2020   HGBA1C 8.6 03/15/2011   Lab Results  Component Value Date   CREATININE 0.66 03/11/2013    Lab Results  Component Value Date   LDLDIRECT 137 (H) 03/15/2011         ASSESSMENT / PLAN / RECOMMENDATIONS:   1) Type 2 Diabetes Mellitus, Poorly controlled, With Neuropathic complications - Most recent A1c of 8.5 %. Goal A1c <7.0 %.    - A1c stable at 8.5% but still above goal  - She is intolerant to  metformin, as well as invokana. Pt has reported hx of recurrent UTI's  -We discussed add-on therapy with GLP-1 agonist, she has been cautioned against GI side effects, she has no personal history of pancreatitis   MEDICATIONS:  Decrease Humalog mix to 48 units with breakfast and 48 units with supper    Start Rybelsus 3 mg, 1 tablet with Breakfast  EDUCATION / INSTRUCTIONS:  BG monitoring instructions: Patient is instructed to check her blood sugars 2 times a day, before breakfast and supper .  Call [facility] clinic if: BG persistently < 70  . I reviewed the Rule of 15 for the treatment of hypoglycemia in detail with the patient. Literature supplied.    2) Diabetic complications:    Eye: Does not have known diabetic retinopathy.   Neuro/ Feet: Does have known diabetic peripheral neuropathy based on her  Exam   Renal: Patient does not have known baseline CKD. She is not on an ACEI/ARB at present.C She is allergic to lisinopril      F/U in 4 months    Signed electronically by: Mack Guise, MD  [facility] Endocrinology  [facility] [address] [place], [place] [address] Phone: [phone] FAX: [phone]   CC: Donald Prose, [place] [address] Phone: [phone]  Fax: [phone]  Return to Endocrinology clinic as below: Future Appointments  Date Time Provider [place]  08/17/2020 10:10 AM Artesia Berkey, Melanie Crazier, MD [facility] None

## 2020-07-17 NOTE — Patient Instructions (Signed)
-   Humalog Mix 48 units with Breakfast and 50 units with Supper - Start Rybelsus 3 mg, 1 tablet with Breakfast     HOW TO TREAT LOW BLOOD SUGARS (Blood sugar LESS THAN 70 MG/DL)  Please follow the RULE OF 15 for the treatment of hypoglycemia treatment (when your (blood sugars are less than 70 mg/dL)    STEP 1: Take 15 grams of carbohydrates when your blood sugar is low, which includes:   3-4 GLUCOSE TABS  OR  3-4 OZ OF JUICE OR REGULAR SODA OR  ONE TUBE OF GLUCOSE GEL     STEP 2: RECHECK blood sugar in 15 MINUTES STEP 3: If your blood sugar is still low at the 15 minute recheck --> then, go back to STEP 1 and treat AGAIN with another 15 grams of carbohydrates.

## 2020-07-27 ENCOUNTER — Telehealth: Payer: Self-pay

## 2020-07-27 NOTE — Telephone Encounter (Signed)
Ryebelsus is too expensive and she is going to be brining in PT assistance program

## 2020-07-27 NOTE — Telephone Encounter (Signed)
Pt called states A1C is out of range therefore the orthopedic will not do Left knee surgery. Pt says now right knee pain due to taking on the extra work.  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 Pt states ortho told her to call endocrinologist to ask to be put out of work.  Please advise pt [phone]. Please advise if we are able to complete note.

## 2020-07-27 NOTE — Telephone Encounter (Signed)
Left a vm for patient to callback to advise of message below

## 2020-07-27 NOTE — Telephone Encounter (Signed)
Pt called states A1C is out of range therefore the orthopedic will not do Left knee surgery. Pt says now right knee pain due to taking on the extra work.  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 Pt states ortho told her to call endocrinologist to ask to be put out of work.  Please advise pt [phone].

## 2020-07-28 NOTE — Telephone Encounter (Signed)
Left another vm for patient to callback to advise on message below

## 2020-07-28 NOTE — Telephone Encounter (Signed)
Patient notified and will bring Patient assistance form to have filled out for medication

## 2020-07-31 ENCOUNTER — Ambulatory Visit: Payer: Medicare Other | Admitting: Internal Medicine

## 2020-08-17 ENCOUNTER — Ambulatory Visit (INDEPENDENT_AMBULATORY_CARE_PROVIDER_SITE_OTHER): Payer: Medicare Other | Admitting: Internal Medicine

## 2020-08-17 ENCOUNTER — Encounter: Payer: Self-pay | Admitting: Internal Medicine

## 2020-08-17 ENCOUNTER — Other Ambulatory Visit: Payer: Self-pay

## 2020-08-17 VITALS — BP 136/74 | HR 82 | Ht 64.0 in | Wt 224.1 lb

## 2020-08-17 DIAGNOSIS — E1142 Type 2 diabetes mellitus with diabetic polyneuropathy: Secondary | ICD-10-CM

## 2020-08-17 DIAGNOSIS — Z794 Long term (current) use of insulin: Secondary | ICD-10-CM

## 2020-08-17 DIAGNOSIS — E1165 Type 2 diabetes mellitus with hyperglycemia: Secondary | ICD-10-CM | POA: Diagnosis not present

## 2020-08-17 LAB — POCT GLYCOSYLATED HEMOGLOBIN (HGB A1C): Hemoglobin A1C: 8.3 % — AB (ref 4.0–5.6)

## 2020-08-17 LAB — GLUCOSE, POCT (MANUAL RESULT ENTRY): POC Glucose: 102 mg/dl — AB (ref 70–99)

## 2020-08-17 NOTE — Progress Notes (Signed)
Name: Jamie Burke  Age/ Sex: 74 y.o., female   MRN/ DOB: [id number], 04-26-1946     PCP: Donald Prose, MD   Reason for Endocrinology Evaluation: Type 2 Diabetes Mellitus  Initial Endocrine Consultative Visit: 12/25/2019    PATIENT IDENTIFIER: Jamie Burke is a 74 y.o. female with a past medical history of T2DM and HTN. The patient has followed with Endocrinology clinic since 12/25/2019 for consultative assistance with management of her diabetes.  DIABETIC HISTORY:  [name] was diagnosed with DM > 20 yr ago. She has been on various  glycemic agents (metformin- weakness in the legs and incontinence, invokana - weakness in the legs. Victoza , Trulicity , she does not recall any side effects ). Her hemoglobin A1c has ranged from 8.6% in 2012, peaking at 8.9% in 2021.    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     SUBJECTIVE:   During the last visit (07/17/2020): A1c 8.5 %. We decreased insulin mix and started Rybelsus   Today (08/17/2020): [name] is here for a follow up on diabetes management.  She checks her blood sugars 2 times daily. The patient has had hypoglycemic episodes since the last clinic visit, which typically occur 1 x /month - most often occuring fasting  The patient is  symptomatic with these episodes.   Last night she woke up with a BG of 79 mg/dL she drank soda and ate nabs.    Has hip and knee pain seeing a new orthpedist  HOME DIABETES REGIMEN:  Humalog Mix 48 units with Breakfast and 50 units with Supper  Rybelsus 3 mg , 1 tablet daily - cost prohibitive      METER DOWNLOAD SUMMARY: Date range evaluated: Unable to download   This AM 139 mg/dL     DIABETIC COMPLICATIONS: Microvascular complications:    Denies: CKD, retinopathy , neuropathy    Last eye exam: Completed 2019  Macrovascular complications:    Denies: CAD, PVD, CVA    HISTORY:  Past Medical History:  Past Medical History:  Diagnosis Date  . Diabetes mellitus   . Dysplastic nevi   . Gastroparesis   . GERD (gastroesophageal reflux disease)   . Hypertension   . Melanoma [facility])    Past Surgical History:  Past Surgical History:  Procedure Laterality Date  . ABDOMINAL HYSTERECTOMY    . CARPAL TUNNEL RELEASE     BL  . TRIGGER FINGER RELEASE      Social History:  reports that she has quit smoking. She quit after 2.00 years of use. She has never used smokeless tobacco. She reports that she does not drink alcohol and does not use drugs. Family History:  Family History  Problem Relation Age of Onset  . Diabetes Mother   . Heart disease Mother   . Hypertension Mother   . Stroke Mother   . Hypertension Father   . Stroke Father   . Cancer Brother 82       Lung Cancer  . Kidney disease Brother      HOME MEDICATIONS: Allergies as of 08/17/2020      Reactions   Lisinopril Swelling   Sumatriptan Other (See Comments)   Very crazy dreams   Sulfa Antibiotics Rash  Medication List       Accurate as of August 17, 2020 10:34 AM. If you have any questions, ask your nurse or doctor.        Accu-Chek FastClix Lancets Misc Use as instructed 3 times daily E11.65   Accu-Chek Guide Me w/Device Kit Use as directed 3 times daily to test blood sugar E11.65   Accu-Chek Guide test strip Generic drug: glucose blood Use as instructed to test blood sugar 3 times daily E11.65   hydrochlorothiazide 25 MG tablet Commonly known as: HYDRODIURIL hydrochlorothiazide 25 mg tablet   ibuprofen 200 MG tablet Commonly known as: ADVIL Take 200 mg by mouth every 6 (six) hours as needed for pain.   Insulin Lispro Prot & Lispro (75-25) 100 UNIT/ML Kwikpen Commonly known as: HumaLOG Mix 75/25 KwikPen Inject 56 Units into the skin daily with breakfast AND 50  Units daily with supper.   Insulin Pen Needle 32G X 4 MM Misc 1 Device by Does not apply route 2 (two) times daily with a meal.   omeprazole 20 MG capsule Commonly known as: PRILOSEC Take 20 mg by mouth daily as needed.   pravastatin 20 MG tablet Commonly known as: PRAVACHOL pravastatin 20 mg tablet   Rybelsus 3 MG Tabs Generic drug: Semaglutide Take 3 mg by mouth daily with breakfast.        OBJECTIVE:   Vital Signs: BP 136/74   Pulse 82   Ht _0  (1.626 m)   Wt 224 lb 2 oz (101.7 kg)   SpO2 97%   BMI 38.47 kg/m   Wt Readings from Last 3 Encounters:  08/17/20 224 lb 2 oz (101.7 kg)  07/17/20 226 lb 6.4 oz (102.7 kg)  03/27/20 225 lb (102.1 kg)     Exam: General: Pt appears well and is in NAD  Lungs: Clear with good BS bilat with no rales, rhonchi, or wheezes  Heart: RRR with normal S1 and S2 and no gallops; no murmurs; no rub  Extremities: No pretibial edema.   Neuro: MS is good with appropriate affect, pt is alert and Ox3    DM foot exam: 12/25/2019  The skin of the feet is intact without sores or ulcerations. The pedal pulses are 2+ on right and 2+ on left. The sensation is absent at the heels to a screening 5.07, 10 gram monofilament bilaterally    DATA REVIEWED:  Lab Results  Component Value Date   HGBA1C 8.3 (A) 08/17/2020   HGBA1C 8.5 (A) 07/17/2020   HGBA1C 8.5 (A) 03/27/2020   Lab Results  Component Value Date   CREATININE 0.66 03/11/2013    Lab Results  Component Value Date   LDLDIRECT 137 (H) 03/15/2011         ASSESSMENT / PLAN / RECOMMENDATIONS:   1) Type 2 Diabetes Mellitus, Poorly controlled, With Neuropathic complications - Most recent A1c of 8.3 %. Goal A1c <7.0 %.    - Slight improvement in A1c  - Pt admits to dietary indiscretions - She is intolerant to metformin, as well as invokana. Pt has reported hx of recurrent UTI's  -GLP-1 agonists are cost prohibitive, she has not had the time to fill out pt assistance program    - I have explained to her the importance of consistency with CHO intake with meals and avoiding snack due to inflexibility with insulin mix. - She declines changing to multiple daily doses of insulin at this time ( basal/pandial)  - Will refer to our RD  MEDICATIONS:   Humalog mix to 50 units with breakfast and 50 units with supper      EDUCATION / INSTRUCTIONS:  BG monitoring instructions: Patient is instructed to check her blood sugars 2 times a day, before breakfast and supper .  Call [facility] if: BG persistently < 70  . I reviewed the Rule of 15 for the treatment of hypoglycemia in detail with the patient. Literature supplied.    2) Diabetic complications:    Eye: Does not have known diabetic retinopathy.   Neuro/ Feet: Does have known diabetic peripheral neuropathy based on her  Exam   Renal: Patient does not have known baseline CKD. She is not on an ACEI/ARB at present.C She is allergic to lisinopril      F/U in 4 months    Signed electronically by: Mack Guise, MD  [facility]  [address] [place], [place] [address] Phone: [phone] FAX: [phone]   CC: Donald Prose, [place] [address] Phone: [phone]  Fax: [phone]  Return to Endocrinology clinic as below: Future Appointments  Date Time Provider [place]  08/21/2020 10:45 AM Felipa Furnace, DPM [facility]  10/26/2020 10:10 AM Devanny Palecek, Melanie Crazier, MD [facility] None

## 2020-08-17 NOTE — Patient Instructions (Signed)
-   Humalog Mix 50 units with Breakfast and 50 units with Supper    HOW TO TREAT LOW BLOOD SUGARS (Blood sugar LESS THAN 70 MG/DL)  Please follow the RULE OF 15 for the treatment of hypoglycemia treatment (when your (blood sugars are less than 70 mg/dL)    STEP 1: Take 15 grams of carbohydrates when your blood sugar is low, which includes:   3-4 GLUCOSE TABS  OR  3-4 OZ OF JUICE OR REGULAR SODA OR  ONE TUBE OF GLUCOSE GEL     STEP 2: RECHECK blood sugar in 15 MINUTES STEP 3: If your blood sugar is still low at the 15 minute recheck --> then, go back to STEP 1 and treat AGAIN with another 15 grams of carbohydrates.

## 2020-08-21 ENCOUNTER — Ambulatory Visit (INDEPENDENT_AMBULATORY_CARE_PROVIDER_SITE_OTHER): Payer: Medicare Other | Admitting: Podiatry

## 2020-08-21 ENCOUNTER — Other Ambulatory Visit: Payer: Self-pay

## 2020-08-21 ENCOUNTER — Encounter: Payer: Self-pay | Admitting: Podiatry

## 2020-08-21 ENCOUNTER — Telehealth: Payer: Self-pay

## 2020-08-21 DIAGNOSIS — Z01818 Encounter for other preprocedural examination: Secondary | ICD-10-CM | POA: Diagnosis not present

## 2020-08-21 DIAGNOSIS — M6740 Ganglion, unspecified site: Secondary | ICD-10-CM

## 2020-08-21 NOTE — Telephone Encounter (Signed)
DOS 09/16/2020 - OFFICE PROCEDURE  EXC GANGLION TOE 3RD RT - 28092  [employer] MEDICARE EFFECTIVE DATE - 10/25/2019  PLAN DEDUCTIBLE - $0.00 OUT OF POCKET - $4500.00 W/ $4259.66 REMAINING COPAY $35.00 COINSURANCE - 0%  Notification or Prior Authorization is not required for the requested services  Decision ID #:[id number]

## 2020-08-21 NOTE — Progress Notes (Signed)
Subjective:  Patient ID: Jamie Burke, female    DOB: 21-Feb-1946,  MRN: [id number]  Chief Complaint  Patient presents with  . Cyst    Right foot 3rd toe cyst. Pt stated that it is painful sometimes     74 y.o. female presents with the above complaint.  Patient presents with right third digit dorsal ganglion versus mucoid cyst.  Patient states that it started become very painful.  She had it drained by Dr. Paulla Dolly last year which helped however it came back again.  She wants to know if there is anything that can be definitive that can be done for this.  She denies any other acute complaints.  She states sometimes sore.  She is tried some shoe gear modification which has not helped.  She has failed all conservative therapy she would like to discuss some treatment options surgically.  She denies any other acute complaints.   Review of Systems: Negative except as noted in the HPI. Denies N/V/F/Ch.  Past Medical History:  Diagnosis Date  . Diabetes mellitus   . Dysplastic nevi   . Gastroparesis   . GERD (gastroesophageal reflux disease)   . Hypertension   . Melanoma ([place])     Current Outpatient Medications:  .  Accu-Chek FastClix Lancets MISC, Use as instructed 3 times daily E11.65, Disp: 100 each, Rfl: 12 .  amoxicillin-clavulanate (AUGMENTIN) 875-125 MG tablet, SMARTSIG:1 Tablet(s) By Mouth Every 12 Hours, Disp: , Rfl:  .  Blood Glucose Monitoring Suppl (ACCU-CHEK GUIDE ME) w/Device KIT, Use as directed 3 times daily to test blood sugar E11.65, Disp: 1 kit, Rfl: 0 .  COVID-19 Specimen Collection KIT, See admin instructions., Disp: , Rfl:  .  glucose blood (ACCU-CHEK GUIDE) test strip, Use as instructed to test blood sugar 3 times daily E11.65, Disp: 100 each, Rfl: 12 .  hydrochlorothiazide (HYDRODIURIL) 25 MG tablet, hydrochlorothiazide 25 mg tablet, Disp: , Rfl:  .  ibuprofen (ADVIL,MOTRIN) 200 MG tablet, Take 200 mg by mouth every 6 (six) hours as needed for pain., Disp: , Rfl:  .   Insulin Lispro Prot & Lispro (HUMALOG MIX 75/25 KWIKPEN) (75-25) 100 UNIT/ML Kwikpen, Inject 56 Units into the skin daily with breakfast AND 50 Units daily with supper., Disp: 30 mL, Rfl: 4 .  Insulin Pen Needle 32G X 4 MM MISC, 1 Device by Does not apply route 2 (two) times daily with a meal., Disp: 100 each, Rfl: 11 .  metFORMIN (GLUCOPHAGE-XR) 500 MG 24 hr tablet, SMARTSIG:1 Tablet(s) By Mouth Every Evening, Disp: , Rfl:  .  MOBIC 15 MG tablet, Take 15 mg by mouth daily., Disp: , Rfl:  .  omeprazole (PRILOSEC) 20 MG capsule, Take 20 mg by mouth daily as needed., Disp: , Rfl:  .  pravastatin (PRAVACHOL) 20 MG tablet, pravastatin 20 mg tablet, Disp: , Rfl:  .  Semaglutide (RYBELSUS) 3 MG TABS, Take 3 mg by mouth daily with breakfast., Disp: 30 tablet, Rfl: 6  Social History   Tobacco Use  Smoking Status Former Smoker  . Years: 2.00  Smokeless Tobacco Never Used    Allergies  Allergen Reactions  . Lisinopril Swelling  . Sumatriptan Other (See Comments)    Very crazy dreams   . Sulfa Antibiotics Rash   Objective:  There were no vitals filed for this visit. There is no height or weight on file to calculate BMI. Constitutional Well developed. Well nourished.  Vascular Dorsalis pedis pulses palpable bilaterally. Posterior tibial pulses palpable bilaterally. Capillary refill  normal to all digits.  No cyanosis or clubbing noted. Pedal hair growth normal.  Neurologic Normal speech. Oriented to person, place, and time. Epicritic sensation to light touch grossly present bilaterally.  Dermatologic  right dorsal third digit mucoid cyst/ganglion cyst.  Positive transilluminates.  It appears to be more indurated than fluctuance.  It is single lobulated.  Does not appear to be involving/penetrating deeper than the dermis tissue.  Orthopedic: Normal joint ROM without pain or crepitus bilaterally. No visible deformities. No bony tenderness.   Radiographs: None Assessment:   1. Mucoid  cyst of joint   2. Ganglion cyst   3. Preoperative examination    Plan:  Patient was evaluated and treated and all questions answered.  Right third digit mucoid cyst/ganglion cyst -I explained to the patient the etiology of the cyst and various treatment options were discussed.  Given that this is a recurrence I believe patient will benefit from a surgical excision of the cyst for more definitive procedure.  I still discussed with the patient that there is a high chance of recurrence associated with it.  Patient would like to proceed with surgical excision despite the risk.  I discussed my surgical planning extensive detail as well as all the complications associated with it.  I also discussed with her given the nature and the location of the cyst there may be a chance that the nail may not grow appropriately given that this cyst is growing very close to the proximal nail fold.  Patient states understanding would like to have it removed regardless of the risks. -I discussed my postop protocol with the patient in extensive detail.  Patient states understanding.  She will be weightbearing as tolerated in surgical shoe. -Informed surgical risk consent was reviewed and read aloud to the patient.  I reviewed the films.  I have discussed my findings with the patient in great detail.  I have discussed all risks including but not limited to infection, stiffness, scarring, limp, disability, deformity, damage to blood vessels and nerves, numbness, poor healing, need for braces, arthritis, chronic pain, amputation, death.  All benefits and realistic expectations discussed in great detail.  I have made no promises as to the outcome.  I have provided realistic expectations.  I have offered the patient a 2nd opinion, which they have declined and assured me they preferred to proceed despite the risks -A total of 33 minutes was spent in direct patient care as well as pre and post patient encounter activities.  This  includes documentation as well as reviewing patient chart for labs, imaging, past medical, surgical, social, and family history as documented in the EMR.  I have reviewed medication allergies as documented in EMR.  I discussed the etiology of condition and treatment options from conservative to surgical care.  All risks and benefit of the treatment course was discussed in detail.  All questions were answered and return appointment was discussed.  Since the visit completed in an ambulatory/outpatient setting, the patient and/or parent/guardian has been advised to contact the providers office for worsening condition and seek medical treatment and/or call 911 if the patient deems either is necessary.   No follow-ups on file.

## 2020-08-27 ENCOUNTER — Telehealth: Payer: Self-pay

## 2020-08-27 NOTE — Telephone Encounter (Signed)
Patient called back to advise that she gets assistance from [facility] and a new RX needs to be sent by fax [phone]

## 2020-08-27 NOTE — Telephone Encounter (Signed)
Patient calling in to report her blood sugar is dropping too low and wants to discuss lowering Humalog-patient would like a call to discuss

## 2020-08-28 ENCOUNTER — Other Ambulatory Visit: Payer: Self-pay

## 2020-08-28 ENCOUNTER — Ambulatory Visit: Payer: Medicare Other | Admitting: Podiatry

## 2020-08-28 DIAGNOSIS — M6740 Ganglion, unspecified site: Secondary | ICD-10-CM

## 2020-08-31 ENCOUNTER — Telehealth: Payer: Self-pay

## 2020-08-31 NOTE — Telephone Encounter (Signed)
There is a telephone encounter on 08/27/2020 stating below.   Patient calling in to report her blood sugar is dropping too low and wants to discuss lowering Humalog-patient would like a call to discuss

## 2020-08-31 NOTE — Telephone Encounter (Signed)
New message   The patient checking on the status of last week messages regarding low blood sugars.    11.7.21 - in the morning 131  11.7.21- before supper - 158  11.7.21- @ 11pm woke up 66

## 2020-08-31 NOTE — Telephone Encounter (Signed)
Message left for patient to return my call.  

## 2020-08-31 NOTE — Telephone Encounter (Signed)
Please ask her to take 40 units of insulin with supper from now on and continue 50 units with Breakfast

## 2020-09-01 ENCOUNTER — Encounter: Payer: Self-pay | Admitting: Podiatry

## 2020-09-01 NOTE — Telephone Encounter (Signed)
Spoken to patient and notified Dr Shamleffer's comments. Verbalized understanding.   

## 2020-09-01 NOTE — Progress Notes (Signed)
Subjective:  Patient ID: Jamie Burke, female    DOB: [birth year],  MRN: [id number]  Chief Complaint  Patient presents with  . Cyst    Right 3rd digit cyst aspiration and drainage.    74 y.o. female presents with the above complaint.  Patient presents with right third digit dorsal ganglion versus mucoid cyst.  Patient states that she would like to not have it drained.  She wants to cancel the surgery as she would like to give it drainage and aspiration 1 more time.  She denies any other acute complaints.  She knows that it can recur as it has already done at once.   Review of Systems: Negative except as noted in the HPI. Denies N/V/F/Ch.  Past Medical History:  Diagnosis Date  . Diabetes mellitus   . Dysplastic nevi   . Gastroparesis   . GERD (gastroesophageal reflux disease)   . Hypertension   . Melanoma ([place])     Current Outpatient Medications:  .  Accu-Chek FastClix Lancets MISC, Use as instructed 3 times daily E11.65, Disp: 100 each, Rfl: 12 .  amoxicillin-clavulanate (AUGMENTIN) 875-125 MG tablet, SMARTSIG:1 Tablet(s) By Mouth Every 12 Hours, Disp: , Rfl:  .  Blood Glucose Monitoring Suppl (ACCU-CHEK GUIDE ME) w/Device KIT, Use as directed 3 times daily to test blood sugar E11.65, Disp: 1 kit, Rfl: 0 .  COVID-19 Specimen Collection KIT, See admin instructions., Disp: , Rfl:  .  glucose blood (ACCU-CHEK GUIDE) test strip, Use as instructed to test blood sugar 3 times daily E11.65, Disp: 100 each, Rfl: 12 .  hydrochlorothiazide (HYDRODIURIL) 25 MG tablet, hydrochlorothiazide 25 mg tablet, Disp: , Rfl:  .  ibuprofen (ADVIL,MOTRIN) 200 MG tablet, Take 200 mg by mouth every 6 (six) hours as needed for pain., Disp: , Rfl:  .  Insulin Lispro Prot & Lispro (HUMALOG MIX 75/25 KWIKPEN) (75-25) 100 UNIT/ML Kwikpen, Inject 56 Units into the skin daily with breakfast AND 50 Units daily with supper., Disp: 30 mL, Rfl: 4 .  Insulin Pen Needle 32G X 4 MM MISC, 1 Device by Does not apply  route 2 (two) times daily with a meal., Disp: 100 each, Rfl: 11 .  metFORMIN (GLUCOPHAGE-XR) 500 MG 24 hr tablet, SMARTSIG:1 Tablet(s) By Mouth Every Evening, Disp: , Rfl:  .  MOBIC 15 MG tablet, Take 15 mg by mouth daily., Disp: , Rfl:  .  omeprazole (PRILOSEC) 20 MG capsule, Take 20 mg by mouth daily as needed., Disp: , Rfl:  .  pravastatin (PRAVACHOL) 20 MG tablet, pravastatin 20 mg tablet, Disp: , Rfl:  .  Semaglutide (RYBELSUS) 3 MG TABS, Take 3 mg by mouth daily with breakfast., Disp: 30 tablet, Rfl: 6  Social History   Tobacco Use  Smoking Status Former Smoker  . Years: 2.00  Smokeless Tobacco Never Used    Allergies  Allergen Reactions  . Lisinopril Swelling  . Sumatriptan Other (See Comments)    Very crazy dreams   . Sulfa Antibiotics Rash   Objective:  There were no vitals filed for this visit. There is no height or weight on file to calculate BMI. Constitutional Well developed. Well nourished.  Vascular Dorsalis pedis pulses palpable bilaterally. Posterior tibial pulses palpable bilaterally. Capillary refill normal to all digits.  No cyanosis or clubbing noted. Pedal hair growth normal.  Neurologic Normal speech. Oriented to person, place, and time. Epicritic sensation to light touch grossly present bilaterally.  Dermatologic  right dorsal third digit mucoid cyst/ganglion cyst.  Positive transilluminates.  It appears to be more indurated than fluctuance.  It is single lobulated.  Does not appear to be involving/penetrating deeper than the dermis tissue.  Orthopedic: Normal joint ROM without pain or crepitus bilaterally. No visible deformities. No bony tenderness.   Radiographs: None Assessment:   1. Mucoid cyst of joint   2. Ganglion cyst    Plan:  Patient was evaluated and treated and all questions answered.  Right third digit mucoid cyst/ganglion cyst -I explained to the patient the etiology of the cyst and various treatment options were discussed.   Given that this is a recurrence I believe patient will benefit from a surgical excision of the cyst for more definitive procedure.  However patient would like to hold off on a definitive procedure for now.  She would like to attempt aspiration drainage 1 more time prior to undergoing definitive procedure and if it comes back then she will definitely have it excised out.  I discussed with the patient in extensive detail for plans of aspiration and drainage of cyst.  Patient states understanding and would like to proceed with the procedure -Betadine was used to cleanse the third digit right foot.  One-to-one mixture 1% lidocaine plain half percent Marcaine plain was utilized to numb up the toe.  Once adequate anesthesia was obtained, an 18-gauge needle was used to penetrate the epidermis and dermis 10 tissue into the cyst.  At this time gelatinous material was drained consistent with a ganglion cyst as opposed to mucoid cyst.  All of the material was drained completely in its entirety.  No complications noted.  The foot was wrapped with 4 x 4 triple antibiotic and Coban.    No follow-ups on file.

## 2020-09-16 ENCOUNTER — Ambulatory Visit: Payer: Medicare Other | Admitting: Podiatry

## 2020-09-23 ENCOUNTER — Encounter: Payer: Medicare Other | Admitting: Podiatry

## 2020-10-07 ENCOUNTER — Encounter: Payer: Medicare Other | Admitting: Podiatry

## 2020-10-13 LAB — HM DIABETES EYE EXAM

## 2020-10-20 ENCOUNTER — Other Ambulatory Visit: Payer: Self-pay | Admitting: Internal Medicine

## 2020-10-26 ENCOUNTER — Encounter: Payer: Self-pay | Admitting: Internal Medicine

## 2020-10-26 ENCOUNTER — Ambulatory Visit: Payer: Self-pay | Admitting: Internal Medicine

## 2020-12-17 ENCOUNTER — Other Ambulatory Visit: Payer: Self-pay

## 2020-12-21 ENCOUNTER — Other Ambulatory Visit: Payer: Self-pay

## 2020-12-21 ENCOUNTER — Ambulatory Visit: Payer: Medicare Other | Admitting: Internal Medicine

## 2020-12-21 ENCOUNTER — Encounter: Payer: Self-pay | Admitting: Internal Medicine

## 2020-12-21 VITALS — BP 170/96 | HR 88 | Ht 64.0 in | Wt 223.1 lb

## 2020-12-21 DIAGNOSIS — E1165 Type 2 diabetes mellitus with hyperglycemia: Secondary | ICD-10-CM | POA: Diagnosis not present

## 2020-12-21 DIAGNOSIS — Z794 Long term (current) use of insulin: Secondary | ICD-10-CM | POA: Diagnosis not present

## 2020-12-21 LAB — POCT GLYCOSYLATED HEMOGLOBIN (HGB A1C): Hemoglobin A1C: 8.1 % — AB (ref 4.0–5.6)

## 2020-12-21 NOTE — Progress Notes (Signed)
Name: Jamie Burke  Age/ Sex: 75 y.o., female   MRN/ DOB: [id number], 04-28-1946     PCP: Donald Prose, MD   Reason for Endocrinology Evaluation: Type 2 Diabetes Mellitus  Initial Endocrine Consultative Visit: 12/25/2019    PATIENT IDENTIFIER: Jamie Burke is a 75 y.o. female with a past medical history of T2DM and HTN. The patient has followed with Endocrinology clinic since 12/25/2019 for consultative assistance with management of her diabetes.  DIABETIC HISTORY:  Jamie Burke was diagnosed with DM > 20 yr ago. She has been on various  glycemic agents (metformin- weakness in the legs and incontinence, invokana - weakness in the legs. Victoza , Trulicity , she does not recall any side effects ). Her hemoglobin A1c has ranged from 8.6% in 2012, peaking at 8.9% in 2021.    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   Rybelsus - cost prohibitive 07/2020   SUBJECTIVE:   During the last visit (08/17/2020): A1c 8.3 %. We increased  insulin mix and she declined swtiching to an MDI regimen      Today (12/21/2020): Jamie Burke is here for a follow up on diabetes management.  She checks her blood sugars 2 times daily. The patient has had hypoglycemic episodes since the last clinic visit, which typically occur 1 x /month - most often occuring fasting  The patient is  symptomatic with these episodes.  Denies nausea or vomiting     HOME DIABETES REGIMEN:  Humalog Mix 50 units with Breakfast and 50 units with Supper      METER DOWNLOAD SUMMARY: 2/14-2/28/2022 Average Number Tests/Day = 2.1 Overall Mean FS Glucose = 159 Standard Deviation = 41  BG Ranges: Low = 29 High = 289    Hypoglycemic Events/30 Days: BG < 50 = 1 Episodes of symptomatic severe hypoglycemia = 1       DIABETIC  COMPLICATIONS: Microvascular complications:    Denies: CKD, retinopathy , neuropathy  Last eye exam: Completed 10/19/2020 Macrovascular complications:    Denies: CAD, PVD, CVA    HISTORY:  Past Medical History:  Past Medical History:  Diagnosis Date  . Diabetes mellitus   . Dysplastic nevi   . Gastroparesis   . GERD (gastroesophageal reflux disease)   . Hypertension   . Melan[facility])    Past Surgical History:  Past Surgical History:  Procedure Laterality Date  . ABDOMINAL HYSTERECTOMY    . CARPAL TUNNEL RELEASE     BL  . TRIGGER FINGER RELEASE      Social History:  reports that she has quit smoking. She quit after 2.00 years of use. She has never used smokeless tobacco. She reports that she does not drink alcohol and does not use drugs. Family History:  Family History  Problem Relation Age of Onset  . Diabetes Mother   . Heart disease Mother   . Hypertension Mother   . Stroke Mother   . Hypertension Father   . Stroke Father   . Cancer Brother 90       Lung Cancer  . Kidney disease Brother      HOME MEDICATIONS: Allergies as of 12/21/2020      Reactions   Lisinopril Swelling   Sumatriptan Other (See Comments)   Very crazy dreams   Sulfa Antibiotics  Rash      Medication List       Accurate as of December 21, 2020  8:28 AM. If you have any questions, ask your nurse or doctor.        Accu-Chek Guide Me w/Device Kit Use as directed 3 times daily to test blood sugar E11.65   Accu-Chek Guide test strip Generic drug: glucose blood Use as instructed to test blood sugar 3 times daily E11.65   Accu-Chek Softclix Lancets lancets USE AS INSTRUCTED 3 TIMES  DAILY   amoxicillin-clavulanate 875-125 MG tablet Commonly known as: AUGMENTIN SMARTSIG:1 Tablet(s) By Mouth Every 12 Hours   COVID-19 Specimen Collection Kit See admin instructions.   hydrochlorothiazide 25 MG tablet Commonly known as: HYDRODIURIL hydrochlorothiazide 25 mg tablet    ibuprofen 200 MG tablet Commonly known as: ADVIL Take 200 mg by mouth every 6 (six) hours as needed for pain.   Insulin Lispro Prot & Lispro (75-25) 100 UNIT/ML Kwikpen Commonly known as: HumaLOG Mix 75/25 KwikPen Inject 56 Units into the skin daily with breakfast AND 50 Units daily with supper.   Insulin Pen Needle 32G X 4 MM Misc 1 Device by Does not apply route 2 (two) times daily with a meal.   metFORMIN 500 MG 24 hr tablet Commonly known as: GLUCOPHAGE-XR SMARTSIG:1 Tablet(s) By Mouth Every Evening   Mobic 15 MG tablet Generic drug: meloxicam Take 15 mg by mouth daily.   omeprazole 20 MG capsule Commonly known as: PRILOSEC Take 20 mg by mouth daily as needed.   pravastatin 20 MG tablet Commonly known as: PRAVACHOL pravastatin 20 mg tablet   Rybelsus 3 MG Tabs Generic drug: Semaglutide Take 3 mg by mouth daily with breakfast.        OBJECTIVE:   Vital Signs: BP (!) 170/96   Pulse 88   Ht _0  (1.626 m)   Wt 223 lb 2 oz (101.2 kg)   SpO2 97%   BMI 38.30 kg/m   Wt Readings from Last 3 Encounters:  12/21/20 223 lb 2 oz (101.2 kg)  08/17/20 224 lb 2 oz (101.7 kg)  07/17/20 226 lb 6.4 oz (102.7 kg)     Exam: General: Pt appears well and is in NAD  Lungs: Clear with good BS bilat with no rales, rhonchi, or wheezes  Heart: RRR   Extremities: No pretibial edema.   Neuro: MS is good with appropriate affect, pt is alert and Ox3    DM foot exam: 12/25/2019  The skin of the feet is intact without sores or ulcerations. The pedal pulses are 2+ on right and 2+ on left. The sensation is absent at the heels to a screening 5.07, 10 gram monofilament bilaterally    DATA REVIEWED:  Lab Results  Component Value Date   HGBA1C 8.1 (A) 12/21/2020   HGBA1C 8.3 (A) 08/17/2020   HGBA1C 8.5 (A) 07/17/2020   Lab Results  Component Value Date   CREATININE 0.66 03/11/2013    Lab Results  Component Value Date   LDLDIRECT 137 (H) 03/15/2011          ASSESSMENT / PLAN / RECOMMENDATIONS:   1) Type 2 Diabetes Mellitus, Poorly controlled, With Neuropathic complications - Most recent A1c of 8.1 %. Goal A1c <7.0 %.    - She is intolerant to metformin, as well as invokana. Pt has reported hx of recurrent UTI's  -GLP-1 agonists are cost prohibitive, will try pt assistance program  - She had a BG reading of 29 mg/dL but  the pt assures me this was a meter error and that she verifies, unfortunately the download does not have a repeat Bg close , unless she used a different meter - She had another hypoglycemia of 63 mgdL overnight. Will adjust evening dose insulin as below  - She will bring pt assistance forms for Humalog   MEDICATIONS:   Humalog mix to 50 units with breakfast and 40 units with supper      EDUCATION / INSTRUCTIONS:  BG monitoring instructions: Patient is instructed to check her blood sugars 2 times a day, before breakfast and supper .  Call [place] [facility] if: BG persistently < 70  . I reviewed the Rule of 15 for the treatment of hypoglycemia in detail with the patient. Literature supplied.    2) Diabetic complications:    Eye: Does not have known diabetic retinopathy.   Neuro/ Feet: Does have known diabetic peripheral neuropathy based on her  Exam   Renal: Patient does not have known baseline CKD. She is not on an ACEI/ARB at present.C She is allergic to lisinopril    3)HTN:   - BP elevated today, she had not been taking HCTZ daily, per pt was advised to take it every other day per PCP.  - Pt advised to take anti-hypertensive meds daily, if she has a side effect, she will need to discuss with PCP about changing meds   F/U in 4 months    Signed electronically by: Mack Guise, MD  [facility]  [facility] [address] [place], [place] [address] Phone: [phone] FAX: [phone]   CC: Donald Prose, [address] [place] [address] Phone: [phone]  Fax: [phone]  Return to Endocrinology clinic as below: No future appointments.

## 2020-12-21 NOTE — Patient Instructions (Signed)
-   Humalog Mix 50 units with Breakfast and 40 units with Supper    HOW TO TREAT LOW BLOOD SUGARS (Blood sugar LESS THAN 70 MG/DL)  Please follow the RULE OF 15 for the treatment of hypoglycemia treatment (when your (blood sugars are less than 70 mg/dL)    STEP 1: Take 15 grams of carbohydrates when your blood sugar is low, which includes:   3-4 GLUCOSE TABS  OR  3-4 OZ OF JUICE OR REGULAR SODA OR  ONE TUBE OF GLUCOSE GEL     STEP 2: RECHECK blood sugar in 15 MINUTES STEP 3: If your blood sugar is still low at the 15 minute recheck --> then, go back to STEP 1 and treat AGAIN with another 15 grams of carbohydrates.

## 2020-12-22 DIAGNOSIS — I10 Essential (primary) hypertension: Secondary | ICD-10-CM | POA: Diagnosis not present

## 2020-12-30 ENCOUNTER — Telehealth: Payer: Self-pay | Admitting: Internal Medicine

## 2020-12-30 NOTE — Telephone Encounter (Signed)
Pt called to check on the status of her [employer] paperwork that she brought in a week ago. She is asking if that form has been faxed back to them yet?

## 2020-12-30 NOTE — Telephone Encounter (Signed)
Per DPR, left detail message for patient it takes about 2-3 weeks for [facility] to respond.

## 2021-01-19 DIAGNOSIS — E1140 Type 2 diabetes mellitus with diabetic neuropathy, unspecified: Secondary | ICD-10-CM | POA: Diagnosis not present

## 2021-01-19 DIAGNOSIS — I10 Essential (primary) hypertension: Secondary | ICD-10-CM | POA: Diagnosis not present

## 2021-02-18 DIAGNOSIS — K5901 Slow transit constipation: Secondary | ICD-10-CM | POA: Diagnosis not present

## 2021-02-18 DIAGNOSIS — K219 Gastro-esophageal reflux disease without esophagitis: Secondary | ICD-10-CM | POA: Diagnosis not present

## 2021-02-21 ENCOUNTER — Other Ambulatory Visit: Payer: Self-pay | Admitting: Internal Medicine

## 2021-03-11 DIAGNOSIS — R1319 Other dysphagia: Secondary | ICD-10-CM | POA: Diagnosis not present

## 2021-03-11 DIAGNOSIS — K219 Gastro-esophageal reflux disease without esophagitis: Secondary | ICD-10-CM | POA: Diagnosis not present

## 2021-03-11 DIAGNOSIS — K5904 Chronic idiopathic constipation: Secondary | ICD-10-CM | POA: Diagnosis not present

## 2021-03-24 ENCOUNTER — Ambulatory Visit: Payer: Medicare Other | Admitting: Internal Medicine

## 2021-03-25 ENCOUNTER — Encounter: Payer: Self-pay | Admitting: Internal Medicine

## 2021-03-25 ENCOUNTER — Other Ambulatory Visit: Payer: Self-pay

## 2021-03-25 ENCOUNTER — Ambulatory Visit: Payer: Medicare Other | Admitting: Internal Medicine

## 2021-03-25 VITALS — BP 140/78 | HR 102 | Ht 64.0 in | Wt 219.2 lb

## 2021-03-25 DIAGNOSIS — Z794 Long term (current) use of insulin: Secondary | ICD-10-CM | POA: Diagnosis not present

## 2021-03-25 DIAGNOSIS — E1165 Type 2 diabetes mellitus with hyperglycemia: Secondary | ICD-10-CM

## 2021-03-25 DIAGNOSIS — E1142 Type 2 diabetes mellitus with diabetic polyneuropathy: Secondary | ICD-10-CM | POA: Diagnosis not present

## 2021-03-25 LAB — POCT GLYCOSYLATED HEMOGLOBIN (HGB A1C): Hemoglobin A1C: 8.5 % — AB (ref 4.0–5.6)

## 2021-03-25 NOTE — Progress Notes (Signed)
Name: Jamie Burke  Age/ Sex: 75 y.o., female   MRN/ DOB: [id number], 14-Jul-1946     PCP: Donald Prose, MD   Reason for Endocrinology Evaluation: Type 2 Diabetes Mellitus  Initial Endocrine Consultative Visit: 12/25/2019    PATIENT IDENTIFIER: Jamie Burke is a 75 y.o. female with a past medical history of T2DM and HTN. The patient has followed with Endocrinology clinic since 12/25/2019 for consultative assistance with management of her diabetes.  DIABETIC HISTORY:  [name] was diagnosed with DM > 20 yr ago. She has been on various  glycemic agents (metformin- weakness in the legs and incontinence, invokana - weakness in the legs. Victoza , Trulicity , she does not recall any side effects ). Her hemoglobin A1c has ranged from 8.6% in 2012, peaking at 8.9% in 2021.    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   Rybelsus - cost prohibitive 07/2020   SUBJECTIVE:   During the last visit (12/15/2020): A1c 8.1 %. We continued   insulin mix     Today (03/25/2021): [name] is here for a follow up on diabetes management.  She checks her blood sugars 2 times daily. The patient has had hypoglycemic episodes since the last clinic visit, which typically occur 1 x /month - most often occuring fasting  The patient is  symptomatic with these episodes.   She has hearburnt, pending EGD on PPI She has been having constipation , on linzess now    HOME DIABETES REGIMEN:  Humalog Mix 50 units with Breakfast and 40 units with Supper      METER DOWNLOAD SUMMARY: 5/19-03/25/2021 Average Number Tests/Day =2.4 Overall Mean FS Glucose = 186 Standard Deviation =61  BG Ranges: Low = 83 High = 295    Hypoglycemic Events/30 Days: BG < 50 = 0 Episodes of symptomatic severe hypoglycemia = 0      DIABETIC  COMPLICATIONS: Microvascular complications:    Denies: CKD, retinopathy , neuropathy   Last eye exam: Completed 10/19/2020   Macrovascular complications:    Denies: CAD, PVD, CVA    HISTORY:  Past Medical History:  Past Medical History:  Diagnosis Date  . Diabetes mellitus   . Dysplastic nevi   . Gastroparesis   . GERD (gastroesophageal reflux disease)   . Hypertension   . Melanoma [facility])    Past Surgical History:  Past Surgical History:  Procedure Laterality Date  . ABDOMINAL HYSTERECTOMY    . CARPAL TUNNEL RELEASE     BL  . TRIGGER FINGER RELEASE      Social History:  reports that she has quit smoking. She quit after 2.00 years of use. She has never used smokeless tobacco. She reports that she does not drink alcohol and does not use drugs. Family History:  Family History  Problem Relation Age of Onset  . Diabetes Mother   . Heart disease Mother   . Hypertension Mother   . Stroke Mother   . Hypertension Father   . Stroke Father   . Cancer Brother 44       Lung Cancer  . Kidney disease Brother      HOME MEDICATIONS: Allergies as of 03/25/2021      Reactions   Lisinopril Swelling   Sumatriptan Other (See Comments)   Very crazy dreams  Sulfa Antibiotics Rash      Medication List       Accurate as of March 25, 2021  1:21 PM. If you have any questions, ask your nurse or doctor.        Accu-Chek Guide Me w/Device Kit Use as directed 3 times daily to test blood sugar E11.65   Accu-Chek Guide test strip Generic drug: glucose blood USE AS INSTRUCTED TO TEST  BLOOD SUGAR 3 TIMES DAILY   Accu-Chek Softclix Lancets lancets USE AS INSTRUCTED 3 TIMES  DAILY   amoxicillin-clavulanate 875-125 MG tablet Commonly known as: AUGMENTIN SMARTSIG:1 Tablet(s) By Mouth Every 12 Hours   COVID-19 Specimen Collection Kit See admin instructions.   hydrochlorothiazide 25 MG tablet Commonly known as: HYDRODIURIL hydrochlorothiazide 25 mg tablet   ibuprofen  200 MG tablet Commonly known as: ADVIL Take 200 mg by mouth every 6 (six) hours as needed for pain.   Insulin Lispro Prot & Lispro (75-25) 100 UNIT/ML Kwikpen Commonly known as: HumaLOG Mix 75/25 KwikPen Inject 56 Units into the skin daily with breakfast AND 50 Units daily with supper.   Insulin Pen Needle 32G X 4 MM Misc 1 Device by Does not apply route 2 (two) times daily with a meal.   Mobic 15 MG tablet Generic drug: meloxicam Take 15 mg by mouth daily.   omeprazole 20 MG capsule Commonly known as: PRILOSEC Take 20 mg by mouth daily as needed.   pravastatin 20 MG tablet Commonly known as: PRAVACHOL pravastatin 20 mg tablet        OBJECTIVE:   Vital Signs: BP 140/78   Pulse (!) 102   Ht 5' 4" (1.626 m)   Wt 219 lb 4 oz (99.5 kg)   SpO2 98%   BMI 37.63 kg/m   Wt Readings from Last 3 Encounters:  03/25/21 219 lb 4 oz (99.5 kg)  12/21/20 223 lb 2 oz (101.2 kg)  08/17/20 224 lb 2 oz (101.7 kg)     Exam: General: Pt appears well and is in NAD  Lungs: Clear with good BS bilat with no rales, rhonchi, or wheezes  Heart: RRR   Extremities: No pretibial edema.   Neuro: MS is good with appropriate affect, pt is alert and Ox3    DM foot exam: 03/25/2021  The skin of the feet is intact without sores or ulcerations. The pedal pulses are 2+ on right and 2+ on left. The sensation is absent at the heels to a screening 5.07, 10 gram monofilament bilaterally    DATA REVIEWED:  Lab Results  Component Value Date   HGBA1C 8.5 (A) 03/25/2021   HGBA1C 8.1 (A) 12/21/2020   HGBA1C 8.3 (A) 08/17/2020   Lab Results  Component Value Date   CREATININE 0.66 03/11/2013    Lab Results  Component Value Date   LDLDIRECT 137 (H) 03/15/2011         ASSESSMENT / PLAN / RECOMMENDATIONS:   1) Type 2 Diabetes Mellitus, Poorly controlled, With Neuropathic complications - Most recent A1c of 8.5 %. Goal A1c <7.0 %.    - She is intolerant to metformin, as well as invokana. Pt  has reported hx of recurrent UTI's  - She is on pt assistance program with [employer]  - We discussed Rybelsus, she has been on Victoza and Trulicity in the past without side effects.  - She was cautioned against GI side effects , if she develops diarrhea, she was advised to stop Linzess - NO hx of pancreatitis in th  past  - No hypoglycemia     MEDICATIONS:   Increase Humalog mix  55 units with breakfast and continue  40 units with supper   Start Rybelsus 3 mg daily - samples provided     EDUCATION / INSTRUCTIONS:  BG monitoring instructions: Patient is instructed to check her blood sugars 2 times a day, before breakfast and supper .  Call [place] [facility] clinic if: BG persistently < 70  . I reviewed the Rule of 15 for the treatment of hypoglycemia in detail with the patient. Literature supplied.    2) Diabetic complications:    Eye: Does not have known diabetic retinopathy.   Neuro/ Feet: Does have known diabetic peripheral neuropathy based on her  Exam   Renal: Patient does not have known baseline CKD. She is not on an ACEI/ARB at present.C She is allergic to lisinopril     F/U in 4 months    Signed electronically by: Mack Guise, MD  [facility]  [facility] [address] [place], [place] [address] Phone: [phone] FAX: [phone]   CC: Donald Prose, [place] [address] Phone: [phone]  Fax: [phone]  Return to Endocrinology clinic as below: Future Appointments  Date Time Provider [place]  04/02/2021  2:15 PM Felipa Furnace, DPM [facility]

## 2021-03-25 NOTE — Patient Instructions (Signed)
-   Increase Humalog Mix to 55 units with Breakfast and Continue 40 units with Supper - Start Rybelsus 3 mg daily     HOW TO TREAT LOW BLOOD SUGARS (Blood sugar LESS THAN 70 MG/DL)  Please follow the RULE OF 15 for the treatment of hypoglycemia treatment (when your (blood sugars are less than 70 mg/dL)    STEP 1: Take 15 grams of carbohydrates when your blood sugar is low, which includes:   3-4 GLUCOSE TABS  OR  3-4 OZ OF JUICE OR REGULAR SODA OR  ONE TUBE OF GLUCOSE GEL     STEP 2: RECHECK blood sugar in 15 MINUTES STEP 3: If your blood sugar is still low at the 15 minute recheck --> then, go back to STEP 1 and treat AGAIN with another 15 grams of carbohydrates.

## 2021-04-02 ENCOUNTER — Ambulatory Visit (INDEPENDENT_AMBULATORY_CARE_PROVIDER_SITE_OTHER): Payer: Medicare Other | Admitting: Podiatry

## 2021-04-02 ENCOUNTER — Other Ambulatory Visit: Payer: Self-pay

## 2021-04-02 DIAGNOSIS — M6740 Ganglion, unspecified site: Secondary | ICD-10-CM

## 2021-04-08 ENCOUNTER — Encounter: Payer: Self-pay | Admitting: Podiatry

## 2021-04-08 NOTE — Progress Notes (Signed)
Subjective:  Patient ID: Jamie Burke, female    DOB: 1945-12-16,  MRN: [id number]  Chief Complaint  Patient presents with   Cyst    Right foot 3rd toe     75 y.o. female presents with the above complaint.  Patient presents with right third digit mucoid cyst.  She states that it has drained already.  She states it popped over the last few days.  She had it drained before in the past by me.  She denies any other acute complaints  Review of Systems: Negative except as noted in the HPI. Denies N/V/F/Ch.  Past Medical History:  Diagnosis Date   Diabetes mellitus    Dysplastic nevi    Gastroparesis    GERD (gastroesophageal reflux disease)    Hypertension    Melanoma (HCC)     Current Outpatient Medications:    Accu-Chek Softclix Lancets lancets, USE AS INSTRUCTED 3 TIMES  DAILY, Disp: 200 each, Rfl: 4   amoxicillin-clavulanate (AUGMENTIN) 875-125 MG tablet, SMARTSIG:1 Tablet(s) By Mouth Every 12 Hours (Patient not taking: Reported on 03/25/2021), Disp: , Rfl:    Blood Glucose Monitoring Suppl (ACCU-CHEK GUIDE ME) w/Device KIT, Use as directed 3 times daily to test blood sugar E11.65, Disp: 1 kit, Rfl: 0   COVID-19 Specimen Collection KIT, See admin instructions., Disp: , Rfl:    glucose blood (ACCU-CHEK GUIDE) test strip, USE AS INSTRUCTED TO TEST  BLOOD SUGAR 3 TIMES DAILY, Disp: 300 strip, Rfl: 3   hydrochlorothiazide (HYDRODIURIL) 25 MG tablet, hydrochlorothiazide 25 mg tablet, Disp: , Rfl:    ibuprofen (ADVIL,MOTRIN) 200 MG tablet, Take 200 mg by mouth every 6 (six) hours as needed for pain., Disp: , Rfl:    Insulin Lispro Prot & Lispro (HUMALOG MIX 75/25 KWIKPEN) (75-25) 100 UNIT/ML Kwikpen, Inject 56 Units into the skin daily with breakfast AND 50 Units daily with supper., Disp: 30 mL, Rfl: 4   Insulin Pen Needle 32G X 4 MM MISC, 1 Device by Does not apply route 2 (two) times daily with a meal., Disp: 100 each, Rfl: 11   MOBIC 15 MG tablet, Take 15 mg by mouth daily., Disp: ,  Rfl:    omeprazole (PRILOSEC) 20 MG capsule, Take 20 mg by mouth daily as needed., Disp: , Rfl:    pravastatin (PRAVACHOL) 20 MG tablet, pravastatin 20 mg tablet, Disp: , Rfl:   Social History   Tobacco Use  Smoking Status Former   Years: 2.00   Pack years: 0.00   Types: Cigarettes  Smokeless Tobacco Never    Allergies  Allergen Reactions   Lisinopril Swelling   Sumatriptan Other (See Comments)    Very crazy dreams    Sulfa Antibiotics Rash   Objective:  There were no vitals filed for this visit. There is no height or weight on file to calculate BMI. Constitutional Well developed. Well nourished.  Vascular Dorsalis pedis pulses palpable bilaterally. Posterior tibial pulses palpable bilaterally. Capillary refill normal to all digits.  No cyanosis or clubbing noted. Pedal hair growth normal.  Neurologic Normal speech. Oriented to person, place, and time. Epicritic sensation to light touch grossly present bilaterally.  Dermatologic Right third digit mucous cyst clinically healed and we epithelialized.  Does not appear to be recurring at this time.  Orthopedic: Normal joint ROM without pain or crepitus bilaterally. No visible deformities. No bony tenderness.   Radiographs: None Assessment:   1. Mucoid cyst of joint     Plan:  Patient was evaluated and treated and  all questions answered.  Right third digit mucoid cyst/ganglion cyst -I clinically it seems like the mucoid cyst had self drained.  At this time I discussed with her that if it recurs to come back and see me and we may have to surgically excise out the entire cyst.  I discussed with the patient in extensive detail she states understanding.    No follow-ups on file.

## 2021-04-28 ENCOUNTER — Telehealth: Payer: Self-pay | Admitting: *Deleted

## 2021-04-28 NOTE — Telephone Encounter (Signed)
Notified pt novo nordisk pt assistant --approved  through  September 22, 2021.

## 2021-05-18 NOTE — Telephone Encounter (Signed)
Called and advised pt Novolog (8 boxes) and Rybelsus (4 boxes) was delivered to the office.

## 2021-05-21 ENCOUNTER — Telehealth: Payer: Self-pay | Admitting: Internal Medicine

## 2021-05-21 NOTE — Telephone Encounter (Signed)
Pt states she usually takes Novolog 75/25 and her patient assistance that she got was Novolog 70/30 . Pt is wondering if she is continuing with the 75/25 and take back the medication she just got (novolog 70/30) or if she should just start the novolog 70/30?   Callback # [phone]

## 2021-05-21 NOTE — Telephone Encounter (Signed)
Please Advise

## 2021-05-21 NOTE — Telephone Encounter (Signed)
Spoke with pt to let her know that it is ok to take the Novolog 70/30

## 2021-06-07 ENCOUNTER — Telehealth: Payer: Self-pay | Admitting: Internal Medicine

## 2021-06-07 NOTE — Telephone Encounter (Signed)
Called and spoke with pt on the phone, she said that she having cramping, stomachache, bloating x2 weeks since she go the Rybelsus , she feels like she has a floater it's like a line or something.  Pt said that she is getting 2 shipment of her Humalog. From [employer] and [place] [facility]. I told her I would look into medication shipment. Please advise

## 2021-06-07 NOTE — Telephone Encounter (Signed)
Patient requests to be called at ph# [phone] re: Patient is having a reaction to Rybelsus (bloating, constipation, vision-possible hallucinations & constant stomach ache.  Also, Patient would like to discuss Humalog

## 2021-06-07 NOTE — Telephone Encounter (Signed)
Called and spoke with patient advised he of this information , pt is going to continue with humalog mix

## 2021-07-14 DIAGNOSIS — Z20822 Contact with and (suspected) exposure to covid-19: Secondary | ICD-10-CM | POA: Diagnosis not present

## 2021-07-20 DIAGNOSIS — K3184 Gastroparesis: Secondary | ICD-10-CM | POA: Diagnosis not present

## 2021-07-20 DIAGNOSIS — J0110 Acute frontal sinusitis, unspecified: Secondary | ICD-10-CM | POA: Diagnosis not present

## 2021-07-20 DIAGNOSIS — I10 Essential (primary) hypertension: Secondary | ICD-10-CM | POA: Diagnosis not present

## 2021-07-22 ENCOUNTER — Telehealth: Payer: Self-pay

## 2021-07-22 NOTE — Telephone Encounter (Signed)
Vm left for patient to callback. Patient assistance is ready for Rybelsus .

## 2021-07-27 DIAGNOSIS — L918 Other hypertrophic disorders of the skin: Secondary | ICD-10-CM | POA: Diagnosis not present

## 2021-07-27 DIAGNOSIS — L570 Actinic keratosis: Secondary | ICD-10-CM | POA: Diagnosis not present

## 2021-07-27 DIAGNOSIS — L821 Other seborrheic keratosis: Secondary | ICD-10-CM | POA: Diagnosis not present

## 2021-07-27 NOTE — Progress Notes (Signed)
Name: Jamie Burke  Age/ Sex: 75 y.o., female   MRN/ DOB: [id number], [birth year]     PCP: Jamie Prose, MD   Reason for Endocrinology Evaluation: Type 2 Diabetes Mellitus  Initial Endocrine Consultative Visit: 12/25/2019    PATIENT IDENTIFIER: Jamie Burke is a 75 y.o. female with a past medical history of T2DM and HTN. The patient has followed with Endocrinology clinic since 12/25/2019 for consultative assistance with management of her diabetes.  DIABETIC HISTORY:  Jamie Burke was diagnosed with DM > 20 yr ago. She has been on various  glycemic agents (metformin- weakness in the legs and incontinence, invokana - weakness in the legs. Victoza , Trulicity , she does not recall any side effects ). Her hemoglobin A1c has ranged from 8.6% in 2012, peaking at 8.9% in 2021.    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   Rybelsus - cost prohibitive 07/2020-  pt assistance forms filled 2022  Stopped it by 07/2021 due to leg pains ?  SUBJECTIVE:   During the last visit (03/25/2021): A1c 8.5 %. We adjusted insulin and started Rybelsus insulin mix     Today (07/28/2021): Jamie Burke is here for a follow up on diabetes management.  She checks her blood sugars 2 times daily. The patient has not had hypoglycemic episodes since the last clinic visit.  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 , has stiffness but no tingling and numbness   She stopped Rybelsus due to shooting pains in [place]:  Humalog Mix 55 units with Breakfast and 40 units with Supper     METER DOWNLOAD SUMMARY: forgot her meter     DIABETIC COMPLICATIONS: Microvascular  complications:   Denies: CKD, retinopathy , neuropathy  Last eye exam: Completed 10/19/2020   Macrovascular complications:    Denies: CAD, PVD, CVA     HISTORY:  Past Medical History:  Past Medical History:  Diagnosis Date   Diabetes mellitus    Dysplastic nevi    Gastroparesis    GERD (gastroesophageal reflux disease)    Hypertension    Melanoma ([place])    Past Surgical History:  Past Surgical History:  Procedure Laterality Date   ABDOMINAL HYSTERECTOMY     CARPAL TUNNEL RELEASE     BL   TRIGGER FINGER RELEASE     Social History:  reports that she has quit smoking. Her smoking use included cigarettes. She has never used smokeless tobacco. She reports that she does not drink alcohol and does not use drugs. Family History:  Family History  Problem Relation Age of Onset   Diabetes Mother    Heart disease Mother    Hypertension Mother    Stroke Mother    Hypertension Father    Stroke Father    Cancer Brother 68       Lung Cancer   Kidney disease Brother      HOME MEDICATIONS: Allergies as of 07/28/2021       Reactions   Lisinopril Swelling   Sumatriptan Other (See Comments)   Very crazy dreams   Sulfa Antibiotics Rash  Medication List        Accurate as of July 28, 2021 10:15 AM. If you have any questions, ask your nurse or doctor.          STOP taking these medications    amoxicillin-clavulanate 875-125 MG tablet Commonly known as: AUGMENTIN Stopped by: Jamie Sciara, MD   COVID-19 Specimen Collection Kit Stopped by: Jamie Sciara, MD   omeprazole 20 MG capsule Commonly known as: PRILOSEC Stopped by: Jamie Sciara, MD       TAKE these medications    Accu-Chek Guide Me w/Device Kit Use as directed 3 times daily to test blood sugar E11.65   Accu-Chek Guide test strip Generic drug: glucose blood USE AS INSTRUCTED TO TEST  BLOOD SUGAR 3 TIMES DAILY   Accu-Chek Softclix Lancets lancets USE AS INSTRUCTED  3 TIMES  DAILY   famotidine 10 MG tablet Commonly known as: PEPCID Take 10 mg by mouth 2 (two) times daily.   hydrochlorothiazide 25 MG tablet Commonly known as: HYDRODIURIL hydrochlorothiazide 25 mg tablet   ibuprofen 200 MG tablet Commonly known as: ADVIL Take 200 mg by mouth every 6 (six) hours as needed for pain.   Insulin Lispro Prot & Lispro (75-25) 100 UNIT/ML Kwikpen Commonly known as: HumaLOG Mix 75/25 KwikPen Inject 56 Units into the skin daily with breakfast AND 50 Units daily with supper.   Insulin Pen Needle 32G X 4 MM Misc 1 Device by Does not apply route 2 (two) times daily with a meal.   Mobic 15 MG tablet Generic drug: meloxicam Take 15 mg by mouth daily.   pravastatin 20 MG tablet Commonly known as: PRAVACHOL pravastatin 20 mg tablet         OBJECTIVE:   Vital Signs: BP (!) 130/92 (BP Location: Left Arm, Patient Position: Sitting, Cuff Size: Small)   Pulse 83   Ht _0  (1.626 m)   Wt 221 lb 6.4 oz (100.4 kg)   SpO2 96%   BMI 38.00 kg/m   Wt Readings from Last 3 Encounters:  07/28/21 221 lb 6.4 oz (100.4 kg)  03/25/21 219 lb 4 oz (99.5 kg)  12/21/20 223 lb 2 oz (101.2 kg)     Exam: General: Pt appears well and is in NAD  Lungs: Clear with good BS bilat with no rales, rhonchi, or wheezes  Heart: RRR   Extremities: No pretibial edema.   Neuro: MS is good with appropriate affect, pt is alert and Ox3    DM foot exam: 07/28/2021   The skin of the feet is intact without sores or ulcerations. The pedal pulses are 2+ on right and 2+ on left. The sensation is absent at the heels to a screening 5.07, 10 gram monofilament bilaterally     DATA REVIEWED:  Lab Results  Component Value Date   HGBA1C 8.4 (A) 07/28/2021   HGBA1C 8.5 (A) 03/25/2021   HGBA1C 8.1 (A) 12/21/2020   Lab Results  Component Value Date   CREATININE 0.66 03/11/2013    Lab Results  Component Value Date   LDLDIRECT 137 (H) 03/15/2011       Results for Jamie, Burke (MRN [id number]) as of 07/29/2021 10:53  Ref. Range 07/28/2021 10:43  Sodium Latest Ref Range: 135 - 145 mEq/L 139  Potassium Latest Ref Range: 3.5 - 5.1 mEq/L 3.7  Chloride Latest Ref Range: 96 - 112 mEq/L 106  CO2 Latest Ref Range: 19 - 32 mEq/L 25  Glucose Latest Ref Range: 70 -  99 mg/dL 104 (H)  BUN Latest Ref Range: 6 - 23 mg/dL 8  Creatinine Latest Ref Range: 0.40 - 1.20 mg/dL 0.64  Calcium Latest Ref Range: 8.4 - 10.5 mg/dL 9.8  GFR Latest Ref Range: >60.00 mL/min 87.08  Total CHOL/HDL Ratio Unknown 5  Cholesterol Latest Ref Range: 0 - 200 mg/dL 183  HDL Cholesterol Latest Ref Range: >39.00 mg/dL 35.90 (L)  LDL (calc) Latest Ref Range: 0 - 99 mg/dL 119 (H)  MICROALB/CREAT RATIO Latest Ref Range: 0.0 - 30.0 mg/g 1.8  NonHDL Unknown 147.14  Triglycerides Latest Ref Range: 0.0 - 149.0 mg/dL 139.0  VLDL Latest Ref Range: 0.0 - 40.0 mg/dL 27.8    ASSESSMENT / PLAN / RECOMMENDATIONS:   1) Type 2 Diabetes Mellitus, Poorly controlled, With Neuropathic complications - Most recent A1c of 8.4 %. Goal A1c <7.0 %.     - She is intolerant to metformin, as well as invokana. Pt has reported hx of recurrent UTI's  - She is on pt assistance program with [employer]  - She stopped Rybelsus due to leg pains  - She was advised to check the Omnipod    MEDICATIONS:  Change  Humalog mix  55 units with breakfast and 44 units with supper    EDUCATION / INSTRUCTIONS: BG monitoring instructions: Patient is instructed to check her blood sugars 2 times a day, before breakfast and supper . Call [facility] if: BG persistently < 70  I reviewed the Rule of 15 for the treatment of hypoglycemia in detail with the patient. Literature supplied.    2) Diabetic complications:   Eye: Does not have known diabetic retinopathy.  Neuro/ Feet: Does have known diabetic peripheral neuropathy based on her  Exam  Renal: Patient does not have known baseline CKD. She is not on an ACEI/ARB at  present.C She is allergic to lisinopril      3) Dyslipidemia :  - LDL above goal  at 119 mg/dL , will switch pravastatin to Atorvastatin    Stop Pravastatin 20 mg daily  Start Atorvastatin 20 mg daily   F/U in 4 months    Signed electronically by: Mack Guise, MD  [facility]  [facility] [address] [place], [place] [address] Phone: [phone] FAX: [phone]   CC: Jamie Burke, [place] [address] Phone: [phone]  Fax: [phone]  Return to Endocrinology clinic as below: No future appointments.

## 2021-07-28 ENCOUNTER — Other Ambulatory Visit: Payer: Self-pay

## 2021-07-28 ENCOUNTER — Ambulatory Visit (INDEPENDENT_AMBULATORY_CARE_PROVIDER_SITE_OTHER): Payer: Medicare Other | Admitting: Internal Medicine

## 2021-07-28 ENCOUNTER — Encounter: Payer: Self-pay | Admitting: Internal Medicine

## 2021-07-28 VITALS — BP 130/92 | HR 83 | Ht 64.0 in | Wt 221.4 lb

## 2021-07-28 DIAGNOSIS — E1165 Type 2 diabetes mellitus with hyperglycemia: Secondary | ICD-10-CM | POA: Diagnosis not present

## 2021-07-28 DIAGNOSIS — E785 Hyperlipidemia, unspecified: Secondary | ICD-10-CM | POA: Diagnosis not present

## 2021-07-28 DIAGNOSIS — Z794 Long term (current) use of insulin: Secondary | ICD-10-CM | POA: Diagnosis not present

## 2021-07-28 DIAGNOSIS — G63 Polyneuropathy in diseases classified elsewhere: Secondary | ICD-10-CM | POA: Diagnosis not present

## 2021-07-28 LAB — BASIC METABOLIC PANEL
BUN: 8 mg/dL (ref 6–23)
CO2: 25 mEq/L (ref 19–32)
Calcium: 9.8 mg/dL (ref 8.4–10.5)
Chloride: 106 mEq/L (ref 96–112)
Creatinine, Ser: 0.64 mg/dL (ref 0.40–1.20)
GFR: 87.08 mL/min (ref >60.00)
Glucose, Bld: 104 mg/dL — ABNORMAL HIGH (ref 70–99)
Potassium: 3.7 mEq/L (ref 3.5–5.1)
Sodium: 139 mEq/L (ref 135–145)

## 2021-07-28 LAB — MICROALBUMIN / CREATININE URINE RATIO
Creatinine,U: 39.8 mg/dL
Microalb Creat Ratio: 1.8 mg/g (ref 0.0–30.0)
Microalb, Ur: <0.7 mg/dL (ref 0.0–1.9)

## 2021-07-28 LAB — LIPID PANEL
Cholesterol: 183 mg/dL (ref 0–200)
HDL: 35.9 mg/dL — ABNORMAL LOW (ref >39.00)
LDL Cholesterol: 119 mg/dL — ABNORMAL HIGH (ref 0–99)
NonHDL: 147.14
Total CHOL/HDL Ratio: 5
Triglycerides: 139 mg/dL (ref 0.0–149.0)
VLDL: 27.8 mg/dL (ref 0.0–40.0)

## 2021-07-28 LAB — POCT GLYCOSYLATED HEMOGLOBIN (HGB A1C): Hemoglobin A1C: 8.4 % — AB (ref 4.0–5.6)

## 2021-07-28 LAB — GLUCOSE, POCT (MANUAL RESULT ENTRY): POC Glucose: 137 mg/dl — AB (ref 70–99)

## 2021-07-28 MED ORDER — GABAPENTIN 100 MG PO CAPS: 300.0000 mg | ORAL_CAPSULE | Freq: Every day | ORAL | 5 refills | Status: DC

## 2021-07-28 NOTE — Patient Instructions (Addendum)
-   Humalog Mix  55 units with Breakfast and 44 units with Supper - Start Gabapentin 100 mg at bedtime ( causes sleepiness) , if feet symptoms are not better, please increase to TWO tablets at bedtime, if not better increase to Three tablets at bedtime and stay there .  Check out Omnipod dash ( insulin pump )  HOW TO TREAT LOW BLOOD SUGARS (Blood sugar LESS THAN 70 MG/DL) Please follow the RULE OF 15 for the treatment of hypoglycemia treatment (when your (blood sugars are less than 70 mg/dL)   STEP 1: Take 15 grams of carbohydrates when your blood sugar is low, which includes:  3-4 GLUCOSE TABS  OR 3-4 OZ OF JUICE OR REGULAR SODA OR ONE TUBE OF GLUCOSE GEL    STEP 2: RECHECK blood sugar in 15 MINUTES STEP 3: If your blood sugar is still low at the 15 minute recheck --> then, go back to STEP 1 and treat AGAIN with another 15 grams of carbohydrates.

## 2021-07-29 ENCOUNTER — Telehealth: Payer: Self-pay | Admitting: Internal Medicine

## 2021-07-29 MED ORDER — ATORVASTATIN CALCIUM 20 MG PO TABS: 20.0000 mg | ORAL_TABLET | Freq: Every day | ORAL | 3 refills | Status: DC

## 2021-07-29 NOTE — Telephone Encounter (Signed)
Patient notified and will pick up new medication.  

## 2021-07-29 NOTE — Telephone Encounter (Signed)
Please let the pt know that her kidney function and urine tests are normal.    Please let her know that her cholesterol is high and I have switched pravastatin 20 mg to Atorvastatin 20 mg daily      Thanks    Abby Nena Jordan, MD  [facility]  [facility] [address] [place], [place] [address] Phone: [phone] FAX: [phone]

## 2021-07-29 NOTE — Telephone Encounter (Signed)
Vm left for patient to call back.

## 2021-08-05 ENCOUNTER — Ambulatory Visit: Payer: Medicare Other | Admitting: Internal Medicine

## 2021-08-18 ENCOUNTER — Telehealth: Payer: Self-pay

## 2021-08-18 NOTE — Telephone Encounter (Signed)
Patient doesn't want the Novolog anymore as she is using Humalog.

## 2021-08-22 DIAGNOSIS — U071 COVID-19: Secondary | ICD-10-CM | POA: Diagnosis not present

## 2021-08-22 DIAGNOSIS — R059 Cough, unspecified: Secondary | ICD-10-CM | POA: Diagnosis not present

## 2021-08-22 DIAGNOSIS — R6883 Chills (without fever): Secondary | ICD-10-CM | POA: Diagnosis not present

## 2021-08-22 DIAGNOSIS — R0989 Other specified symptoms and signs involving the circulatory and respiratory systems: Secondary | ICD-10-CM | POA: Diagnosis not present

## 2021-08-22 DIAGNOSIS — R52 Pain, unspecified: Secondary | ICD-10-CM | POA: Diagnosis not present

## 2021-09-09 DIAGNOSIS — U071 COVID-19: Secondary | ICD-10-CM | POA: Diagnosis not present

## 2021-09-30 DIAGNOSIS — M25561 Pain in right knee: Secondary | ICD-10-CM | POA: Diagnosis not present

## 2021-09-30 DIAGNOSIS — M5441 Lumbago with sciatica, right side: Secondary | ICD-10-CM | POA: Diagnosis not present

## 2021-10-12 ENCOUNTER — Telehealth: Payer: Self-pay

## 2021-10-12 NOTE — Telephone Encounter (Signed)
I called patient to inform her that her Rybelsus was ready for pick up. Patient states that she is not on medication and she thought she informed you last visit that it was causing leg pain. She wants to know if you would like her to stay on it or not.

## 2021-10-12 NOTE — Telephone Encounter (Signed)
Patient is aware and will also contact [employer] as well.

## 2021-10-13 NOTE — Telephone Encounter (Signed)
[employer] has been notified and they will send the slip for return on samples that have been sent

## 2021-10-14 DIAGNOSIS — M545 Low back pain, unspecified: Secondary | ICD-10-CM | POA: Diagnosis not present

## 2021-10-20 DIAGNOSIS — Z1231 Encounter for screening mammogram for malignant neoplasm of breast: Secondary | ICD-10-CM | POA: Diagnosis not present

## 2021-10-27 DIAGNOSIS — A084 Viral intestinal infection, unspecified: Secondary | ICD-10-CM | POA: Diagnosis not present

## 2021-11-19 ENCOUNTER — Telehealth: Payer: Self-pay | Admitting: Internal Medicine

## 2021-11-19 MED ORDER — ACCU-CHEK GUIDE VI STRP: ORAL_STRIP | 3 refills | Status: DC

## 2021-11-19 NOTE — Telephone Encounter (Signed)
Pt is calling in stating that she is needing a refill on Rx Blood Glucose (ACCU CHEK GUIDE) test strips  Pharm:  [facility]

## 2021-11-19 NOTE — Telephone Encounter (Signed)
Script sent  

## 2021-12-07 DIAGNOSIS — H905 Unspecified sensorineural hearing loss: Secondary | ICD-10-CM | POA: Diagnosis not present

## 2022-01-12 DIAGNOSIS — I10 Essential (primary) hypertension: Secondary | ICD-10-CM | POA: Diagnosis not present

## 2022-01-12 DIAGNOSIS — N390 Urinary tract infection, site not specified: Secondary | ICD-10-CM | POA: Diagnosis not present

## 2022-01-25 ENCOUNTER — Ambulatory Visit (HOSPITAL_BASED_OUTPATIENT_CLINIC_OR_DEPARTMENT_OTHER): Payer: Medicare Other | Admitting: Cardiovascular Disease

## 2022-01-25 DIAGNOSIS — M5441 Lumbago with sciatica, right side: Secondary | ICD-10-CM | POA: Diagnosis not present

## 2022-02-01 ENCOUNTER — Encounter: Payer: Self-pay | Admitting: Internal Medicine

## 2022-02-01 ENCOUNTER — Telehealth: Payer: Self-pay

## 2022-02-01 ENCOUNTER — Ambulatory Visit: Payer: Medicare Other | Admitting: Internal Medicine

## 2022-02-01 VITALS — BP 120/76 | HR 93 | Ht 64.0 in | Wt 221.0 lb

## 2022-02-01 DIAGNOSIS — E1142 Type 2 diabetes mellitus with diabetic polyneuropathy: Secondary | ICD-10-CM

## 2022-02-01 DIAGNOSIS — G63 Polyneuropathy in diseases classified elsewhere: Secondary | ICD-10-CM | POA: Diagnosis not present

## 2022-02-01 DIAGNOSIS — Z794 Long term (current) use of insulin: Secondary | ICD-10-CM | POA: Diagnosis not present

## 2022-02-01 DIAGNOSIS — R739 Hyperglycemia, unspecified: Secondary | ICD-10-CM

## 2022-02-01 DIAGNOSIS — E785 Hyperlipidemia, unspecified: Secondary | ICD-10-CM | POA: Diagnosis not present

## 2022-02-01 DIAGNOSIS — E1165 Type 2 diabetes mellitus with hyperglycemia: Secondary | ICD-10-CM

## 2022-02-01 LAB — POCT GLYCOSYLATED HEMOGLOBIN (HGB A1C): Hemoglobin A1C: 8.8 % — AB (ref 4.0–5.6)

## 2022-02-01 MED ORDER — BD PEN NEEDLE MICRO U/F 32G X 6 MM MISC: 1.0000 | Freq: Four times a day (QID) | 3 refills | Status: DC

## 2022-02-01 MED ORDER — RYBELSUS 3 MG PO TABS
3.0000 mg | ORAL_TABLET | Freq: Every day | ORAL | 0 refills | Status: DC
Start: 2022-02-01 — End: 2022-04-27

## 2022-02-01 MED ORDER — RYBELSUS 7 MG PO TABS: 7.0000 mg | ORAL_TABLET | Freq: Every day | ORAL | 3 refills | Status: DC

## 2022-02-01 MED ORDER — INSULIN LISPRO (1 UNIT DIAL) 100 UNIT/ML (KWIKPEN): 12.0000 [IU] | PEN_INJECTOR | Freq: Three times a day (TID) | SUBCUTANEOUS | 11 refills | Status: DC

## 2022-02-01 MED ORDER — DEXCOM G6 SENSOR MISC: 1.0000 | 3 refills | Status: DC

## 2022-02-01 MED ORDER — TOUJEO SOLOSTAR 300 UNIT/ML ~~LOC~~ SOPN: 50.0000 [IU] | PEN_INJECTOR | Freq: Every day | SUBCUTANEOUS | 4 refills | Status: DC

## 2022-02-01 MED ORDER — DEXCOM G6 TRANSMITTER MISC: 1.0000 | 3 refills | Status: DC

## 2022-02-01 NOTE — Patient Instructions (Addendum)
-   Stop Humaog Mix  ?- START Toujeo 50 units ONCE daily at night  ?- START Humalog ( Plain) 12 units with a meal and 6 units with a snack  ?- Restart Rybelsus 7 mg, 1 tablet daily before Breakfast  ? ? ?HOW TO TREAT LOW BLOOD SUGARS (Blood sugar LESS THAN 70 MG/DL) ?Please follow the RULE OF 15 for the treatment of hypoglycemia treatment (when your (blood sugars are less than 70 mg/dL)  ? ?STEP 1: Take 15 grams of carbohydrates when your blood sugar is low, which includes:  ?3-4 GLUCOSE TABS  OR ?3-4 OZ OF JUICE OR REGULAR SODA OR ?ONE TUBE OF GLUCOSE GEL   ? ?STEP 2: RECHECK blood sugar in 15 MINUTES ?STEP 3: If your blood sugar is still low at the 15 minute recheck --> then, go back to STEP 1 and treat AGAIN with another 15 grams of carbohydrates. ? ?

## 2022-02-01 NOTE — Progress Notes (Signed)
? ?Name: Jamie Burke  ?Age/ Sex: 76 y.o., female   ?MRN/ DOB: [id number], Jun 27, 1946    ? ?PCP: Donald Prose, MD   ?Reason for Endocrinology Evaluation: Type 2 Diabetes Mellitus  ?Initial Endocrine Consultative Visit: 12/25/2019  ? ? ?PATIENT IDENTIFIER: Jamie Burke is a 76 y.o. female with a past medical history of T2DM and HTN. The patient has followed with Endocrinology clinic since 12/25/2019 for consultative assistance with management of her diabetes. ? ?DIABETIC HISTORY:  ?Jamie Burke was diagnosed with DM > 20 yr ago. She has been on various  glycemic agents (metformin- weakness in the legs and incontinence, invokana - weakness in the legs. Victoza , Trulicity , she does not recall any side effects ). Her hemoglobin A1c has ranged from 8.6% in 2012, peaking at 8.9% in 2021. ? ? ? 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 ?On her initial visit to our clinic she had an A1c of 9.2%, she was on insulin mix and had metformin but she was not taking it due to GI effects.  ? ?Rybelsus - cost prohibitive 07/2020-  pt assistance forms filled 2022 ? ?Stopped it by 07/2021 due to leg pains ? ? ?SUBJECTIVE:  ? ? ? ?During the last visit (07/28/2021): A1c 8.4 %. We adjusted insulin mix ? ? ? ?Today (02/01/2022): Jamie Burke is here for a follow up on diabetes management.  She checks her blood sugars 2 times daily. The patient has not had hypoglycemic episodes since the last clinic visit. ? ?She continues with heartburn and constipation , but symptoms are improving  ?She is constantly snacking  ?She continues with leg pains and had MRI and was diagnosed with lumbar radiculopathy ? ?She received a right hip intra-articular injection  ?She was treated for UTI  ?She had quit her job and has not been as active ? ? ?HOME DIABETES REGIMEN:  ?Humalog Mix 55 units with Breakfast and 44 units with Supper  ? ? ? ?METER DOWNLOAD SUMMARY: forgot her meter ? ? ? ? ?DIABETIC COMPLICATIONS: ?Microvascular  complications:  ? ?Denies: CKD, retinopathy , neuropathy  ?Last eye exam: Completed 10/19/2020 ? ? ?Macrovascular complications:  ?  ?Denies: CAD, PVD, CVA ?  ? ? ?HISTORY:  ?Past Medical History:  ?Past Medical History:  ?Diagnosis Date  ? Diabetes mellitus   ? Dysplastic nevi   ? Gastroparesis   ? GERD (gastroesophageal reflux disease)   ? Hypertension   ? Melanoma ([place])   ? ?Past Surgical History:  ?Past Surgical History:  ?Procedure Laterality Date  ? ABDOMINAL HYSTERECTOMY    ? CARPAL TUNNEL RELEASE    ? BL  ? TRIGGER FINGER RELEASE    ? ?Social History:  reports that she has quit smoking. Her smoking use included cigarettes. She has never used smokeless tobacco. She reports that she does not drink alcohol and does not use drugs. ?Family History:  ?Family History  ?Problem Relation Age of Onset  ? Diabetes Mother   ? Heart disease Mother   ? Hypertension Mother   ? Stroke Mother   ? Hypertension Father   ? Stroke Father   ? Cancer Brother 27  ?     Lung Cancer  ? Kidney disease Brother   ? ? ? ?HOME MEDICATIONS: ?Allergies as of 02/01/2022   ? ?   Reactions  ? Atenolol Other (See Comments)  ? Atorvastatin Other (See Comments)  ? Canagliflozin Other (See Comments)  ? Liraglutide Other (See Comments)  ?  Lisinopril Swelling  ? Lisinopril-hydrochlorothiazide Other (See Comments)  ? Losartan Potassium-hctz Other (See Comments)  ? Metformin Hcl Other (See Comments)  ? Sumatriptan Other (See Comments)  ? Very crazy dreams  ? Sulfa Antibiotics Rash  ? ?  ? ?  ?Medication List  ?  ? ?  ? Accurate as of February 01, 2022  8:35 AM. If you have any questions, ask your nurse or doctor.  ?  ?  ? ?  ? ?Accu-Chek Guide Me w/Device Kit ?Use as directed 3 times daily to test blood sugar E11.65 ?  ?Accu-Chek Guide test strip ?Generic drug: glucose blood ?USE AS INSTRUCTED TO TEST  BLOOD SUGAR 3 TIMES DAILY ?  ?Accu-Chek Softclix Lancets lancets ?USE AS INSTRUCTED 3 TIMES  DAILY ?  ?atorvastatin 20 MG tablet ?Commonly known as:  LIPITOR ?Take 1 tablet (20 mg total) by mouth daily. ?  ?famotidine 10 MG tablet ?Commonly known as: PEPCID ?Take 10 mg by mouth 2 (two) times daily. ?  ?gabapentin 100 MG capsule ?Commonly known as: NEURONTIN ?Take 3 capsules (300 mg total) by mouth at bedtime. ?  ?hydrochlorothiazide 25 MG tablet ?Commonly known as: HYDRODIURIL ?hydrochlorothiazide 25 mg tablet ?  ?ibuprofen 200 MG tablet ?Commonly known as: ADVIL ?Take 200 mg by mouth every 6 (six) hours as needed for pain. ?  ?Insulin Lispro Prot & Lispro (75-25) 100 UNIT/ML Kwikpen ?Commonly known as: HumaLOG Mix 75/25 KwikPen ?Inject 56 Units into the skin daily with breakfast AND 50 Units daily with supper. ?  ?Insulin Pen Needle 32G X 4 MM Misc ?1 Device by Does not apply route 2 (two) times daily with a meal. ?  ?Mobic 15 MG tablet ?Generic drug: meloxicam ?Take 15 mg by mouth daily. ?  ? ?  ? ? ? ?OBJECTIVE:  ? ?Vital Signs: BP 120/76 (BP Location: Left Arm, Patient Position: Sitting, Cuff Size: Large)   Pulse 93   Ht _0  (1.626 m)   Wt 221 lb (100.2 kg)   SpO2 95%   BMI 37.93 kg/m?   ?Wt Readings from Last 3 Encounters:  ?02/01/22 221 lb (100.2 kg)  ?07/28/21 221 lb 6.4 oz (100.4 kg)  ?03/25/21 219 lb 4 oz (99.5 kg)  ? ? ? ?Exam: ?General: Pt appears well and is in NAD  ?Lungs: Clear with good BS bilat with no rales, rhonchi, or wheezes  ?Heart: RRR   ?Extremities: No pretibial edema.   ?Neuro: MS is good with appropriate affect, pt is alert and Ox3  ? ? ?DM foot exam: 07/28/2021 ?  ?The skin of the feet is intact without sores or ulcerations. ?The pedal pulses are 2+ on right and 2+ on left. ?The sensation is absent at the heels to a screening 5.07, 10 gram monofilament bilaterally ?  ? ? ?DATA REVIEWED: ? ?Lab Results  ?Component Value Date  ? HGBA1C 8.8 (A) 02/01/2022  ? HGBA1C 8.4 (A) 07/28/2021  ? HGBA1C 8.5 (A) 03/25/2021  ? ?Lab Results  ?Component Value Date  ? MICROALBUR <0.7 07/28/2021  ? LDLCALC 119 (H) 07/28/2021  ? CREATININE 0.64  07/28/2021  ? ? ?Lab Results  ?Component Value Date  ? CHOL 183 07/28/2021  ? HDL 35.90 (L) 07/28/2021  ? LDLCALC 119 (H) 07/28/2021  ? LDLDIRECT 137 (H) 03/15/2011  ? TRIG 139.0 07/28/2021  ? CHOLHDL 5 07/28/2021  ?     ?Results for Jamie Burke, Jamie Burke (MRN [id number]) as of 07/29/2021 10:53 ? Ref. Range 07/28/2021 10:43  ?Sodium Latest  Ref Range: 135 - 145 mEq/L 139  ?Potassium Latest Ref Range: 3.5 - 5.1 mEq/L 3.7  ?Chloride Latest Ref Range: 96 - 112 mEq/L 106  ?CO2 Latest Ref Range: 19 - 32 mEq/L 25  ?Glucose Latest Ref Range: 70 - 99 mg/dL 104 (H)  ?BUN Latest Ref Range: 6 - 23 mg/dL 8  ?Creatinine Latest Ref Range: 0.40 - 1.20 mg/dL 0.64  ?Calcium Latest Ref Range: 8.4 - 10.5 mg/dL 9.8  ?GFR Latest Ref Range: >60.00 mL/min 87.08  ?Total CHOL/HDL Ratio Unknown 5  ?Cholesterol Latest Ref Range: 0 - 200 mg/dL 183  ?HDL Cholesterol Latest Ref Range: >39.00 mg/dL 35.90 (L)  ?LDL (calc) Latest Ref Range: 0 - 99 mg/dL 119 (H)  ?MICROALB/CREAT RATIO Latest Ref Range: 0.0 - 30.0 mg/g 1.8  ?NonHDL Unknown 147.14  ?Triglycerides Latest Ref Range: 0.0 - 149.0 mg/dL 139.0  ?VLDL Latest Ref Range: 0.0 - 40.0 mg/dL 27.8  ? ? ?ASSESSMENT / PLAN / RECOMMENDATIONS:  ? ?1) Type 2 Diabetes Mellitus, Poorly controlled, With Neuropathic complications - Most recent A1c of 8.8 %. Goal A1c <7.0 %.   ?  ?-A1c continues to trend up from 8.4% to 8.8%, this is multifactorial given dietary indiscretion as well as decreased physical activity ?- She is intolerant to metformin, as well as invokana. Pt has reported hx of recurrent UTI's  ?- She stopped Rybelsus due to leg pains , but she agreed to restart today now that she knows her leg pains are due to lumbar radiculopathy ?-We discussed stopping insulin mix and switching to basal/prandial insulin regimen ?-We have faxed a prescription of Dexcom to [facility] health care ?-I have encouraged her to look into aerobic exercises, as this would be optimal given musculoskeletal  complaints ? ?MEDICATIONS: ?Stop Humalog mix  ?Start Toujeo 50 units daily ?Start Humalog 12 units 3 times daily before every meal ?Humalog 6 units with snacks ?Restart Rybelsus 7 mg daily ? ? ?EDUCATION / INSTRUCTIONS: ?BG monitoring instructions:

## 2022-02-01 NOTE — Telephone Encounter (Signed)
Sample documented  ?

## 2022-02-02 ENCOUNTER — Ambulatory Visit: Payer: Medicare Other | Admitting: Internal Medicine

## 2022-02-03 DIAGNOSIS — K31A11 Gastric intestinal metaplasia without dysplasia, involving the antrum: Secondary | ICD-10-CM | POA: Diagnosis not present

## 2022-02-03 DIAGNOSIS — K2940 Chronic atrophic gastritis without bleeding: Secondary | ICD-10-CM | POA: Diagnosis not present

## 2022-02-03 DIAGNOSIS — K31A12 Gastric intestinal metaplasia without dysplasia, involving the body (corpus): Secondary | ICD-10-CM | POA: Diagnosis not present

## 2022-02-03 DIAGNOSIS — K219 Gastro-esophageal reflux disease without esophagitis: Secondary | ICD-10-CM | POA: Diagnosis not present

## 2022-02-03 DIAGNOSIS — R1310 Dysphagia, unspecified: Secondary | ICD-10-CM | POA: Diagnosis not present

## 2022-02-03 DIAGNOSIS — K2930 Chronic superficial gastritis without bleeding: Secondary | ICD-10-CM | POA: Diagnosis not present

## 2022-02-07 ENCOUNTER — Telehealth: Payer: Self-pay | Admitting: Internal Medicine

## 2022-02-07 ENCOUNTER — Telehealth: Payer: Self-pay

## 2022-02-07 NOTE — Telephone Encounter (Signed)
Patient dropped off Patient Assistance Application ([employer]?) to be completed by Dr. Kelton Pillar. Once completed Patient requests the Application be faxed to the fax# located on the application. Patient Assistance Application has been placed in Dr. Quin Hoop mail box at front desk. ?

## 2022-02-07 NOTE — Telephone Encounter (Signed)
Patient is unable to afford the [facility] and will bring her portion of patient assistance application by office.   ?

## 2022-02-08 ENCOUNTER — Ambulatory Visit (HOSPITAL_BASED_OUTPATIENT_CLINIC_OR_DEPARTMENT_OTHER): Payer: Medicare Other | Admitting: Cardiovascular Disease

## 2022-02-08 NOTE — Telephone Encounter (Signed)
[employer] patient assistance has been approved and will ship out in 3-5 business days. Detail vm left for patient  ?

## 2022-02-09 DIAGNOSIS — K219 Gastro-esophageal reflux disease without esophagitis: Secondary | ICD-10-CM | POA: Diagnosis not present

## 2022-02-09 DIAGNOSIS — K2930 Chronic superficial gastritis without bleeding: Secondary | ICD-10-CM | POA: Diagnosis not present

## 2022-02-09 DIAGNOSIS — K31A11 Gastric intestinal metaplasia without dysplasia, involving the antrum: Secondary | ICD-10-CM | POA: Diagnosis not present

## 2022-02-17 ENCOUNTER — Telehealth: Payer: Self-pay

## 2022-02-17 NOTE — Telephone Encounter (Signed)
Patient advised that Toujeo patient assistance is ready for pick up  ?

## 2022-02-28 ENCOUNTER — Telehealth: Payer: Self-pay

## 2022-02-28 NOTE — Telephone Encounter (Addendum)
Patient states that the Rybelsus and the Toujeo are causing lip swelling and don't seem to be helping her sugar levels at all. Patient states that her numbers have been in the 150 in the morning and 200s in the afternoon.  ?

## 2022-03-01 NOTE — Telephone Encounter (Signed)
Patient notified and verbalized understanding. 

## 2022-03-03 ENCOUNTER — Ambulatory Visit (HOSPITAL_BASED_OUTPATIENT_CLINIC_OR_DEPARTMENT_OTHER): Payer: Medicare Other | Admitting: Cardiovascular Disease

## 2022-03-05 DIAGNOSIS — R112 Nausea with vomiting, unspecified: Secondary | ICD-10-CM | POA: Diagnosis not present

## 2022-03-05 DIAGNOSIS — Z794 Long term (current) use of insulin: Secondary | ICD-10-CM | POA: Diagnosis not present

## 2022-03-05 DIAGNOSIS — I4510 Unspecified right bundle-branch block: Secondary | ICD-10-CM | POA: Diagnosis not present

## 2022-03-07 ENCOUNTER — Ambulatory Visit (HOSPITAL_BASED_OUTPATIENT_CLINIC_OR_DEPARTMENT_OTHER): Payer: Medicare Other | Admitting: Cardiovascular Disease

## 2022-03-07 ENCOUNTER — Telehealth: Payer: Self-pay

## 2022-03-07 NOTE — Telephone Encounter (Signed)
Patient states that she has went back to the Humalog 75 25 mix. Patient has to go to UC because she was having swelling,dizzy and headaches. She states that symptoms were side effects for the medicine.. Patient states that even with the mix insulin her sugar are still running 185 in the morning.  ?

## 2022-03-07 NOTE — Telephone Encounter (Signed)
Patient states that Toujeo caused her to have the symptoms and she stopped taking the Rybelsus last week.  Patient has been doing just Humalog mix since she went to UC this weekend.  Sugar was 134 this morning.  ?

## 2022-03-15 DIAGNOSIS — I10 Essential (primary) hypertension: Secondary | ICD-10-CM | POA: Diagnosis not present

## 2022-03-15 DIAGNOSIS — Z0000 Encounter for general adult medical examination without abnormal findings: Secondary | ICD-10-CM | POA: Diagnosis not present

## 2022-03-15 DIAGNOSIS — E7800 Pure hypercholesterolemia, unspecified: Secondary | ICD-10-CM | POA: Diagnosis not present

## 2022-03-28 ENCOUNTER — Encounter (HOSPITAL_BASED_OUTPATIENT_CLINIC_OR_DEPARTMENT_OTHER): Payer: Self-pay | Admitting: Cardiovascular Disease

## 2022-03-28 ENCOUNTER — Ambulatory Visit (HOSPITAL_BASED_OUTPATIENT_CLINIC_OR_DEPARTMENT_OTHER): Payer: Medicare Other | Admitting: Cardiovascular Disease

## 2022-03-28 ENCOUNTER — Telehealth: Payer: Self-pay

## 2022-03-28 DIAGNOSIS — R000 Tachycardia, unspecified: Secondary | ICD-10-CM | POA: Diagnosis not present

## 2022-03-28 DIAGNOSIS — R0683 Snoring: Secondary | ICD-10-CM | POA: Diagnosis not present

## 2022-03-28 DIAGNOSIS — E785 Hyperlipidemia, unspecified: Secondary | ICD-10-CM | POA: Diagnosis not present

## 2022-03-28 DIAGNOSIS — I10 Essential (primary) hypertension: Secondary | ICD-10-CM | POA: Diagnosis not present

## 2022-03-28 DIAGNOSIS — R0789 Other chest pain: Secondary | ICD-10-CM | POA: Diagnosis not present

## 2022-03-28 HISTORY — DX: Tachycardia, unspecified: R00.0

## 2022-03-28 HISTORY — DX: Snoring: R06.83

## 2022-03-28 MED ORDER — IVABRADINE HCL 5 MG PO TABS: ORAL_TABLET | ORAL | 0 refills | Status: DC

## 2022-03-28 MED ORDER — METOPROLOL TARTRATE 100 MG PO TABS: ORAL_TABLET | ORAL | 0 refills | Status: DC

## 2022-03-28 NOTE — Assessment & Plan Note (Signed)
She is tachycardic today.  Check TSH and CBC.  Also getting a sleep study as above.

## 2022-03-28 NOTE — Assessment & Plan Note (Signed)
She recently started on a statin.  She previously did not tolerate statins due to myalgias.  She is willing to continue for now.  Her LDL should be less than 100 at least, ideally less than 70.  We will be able to better evaluate her goal after her coronary CTA.

## 2022-03-28 NOTE — Assessment & Plan Note (Signed)
We will get a sleep study due to snoring, apnea and daytime somnolence.

## 2022-03-28 NOTE — Assessment & Plan Note (Signed)
Ms. Retana has atypical right-sided chest pain.  Although it is atypical it does occur with exertion and she has risk factors.  We will get a coronary CTA to evaluate.  Given her tachycardia we will give her metoprolol 100 mg and 5 mg of ivabradine.

## 2022-03-28 NOTE — Assessment & Plan Note (Signed)
Blood pressure has been pretty well-controlled.   Slightly above goal.  Once we evaluate her coronaries, recommend that she be referred to the PREP program.

## 2022-03-28 NOTE — Telephone Encounter (Signed)
Patient states she is just taking the Humalog 55 units in the afternoon and 55 units in the evening. Pattient has been off the Rybelsus and the Toujeo for about a month.   BS 200-300 in the evening   BS 175 in the morning

## 2022-03-28 NOTE — Progress Notes (Signed)
Cardiology Office Note:    Date:  03/28/2022   ID:  Jamie Burke, Jamie Burke 02/07/1946, MRN [id number]  PCP:  Jamie Prose, MD   [facility] Providers Cardiologist:  None     Referring MD: Jamie Prose, MD   No chief complaint on file.   History of Present Illness:    Jamie Burke is a 76 y.o. female with a hx of RBBB, hypertension, diabetes, GERD, and gastroparesis, here to re-establish care. She was previously a patient of Dr. Bettina Burke, last seen in 2019. Prior nuclear stress 07/2018 testing was low risk and LVEF was 57%. He recommended starting pravastatin. Today, she confirms being diagnosed with a RBBB in 2020. She presented to urgent care in March due to chest pain. She complains of right-sided chest pain that comes and goes, maybe every 2-3 days with a typical duration of a minute. She describes it as a sharp pain, localized in the same area each time. This may occur while walking or completing housework. Sometimes she will feel associated flutters with her chest pain. This morning she felt nauseous. At home her blood pressure is usually stable in the 130's. She endorses persistent swelling in her feet which she attributes to her diabetes. Currently she is not taking Rybelsus due to side effects of leg weakness. While on other medications for diabetes she felt like her tongue was swelling. She takes meloxicam infrequently for severe knee pain, and ibuprofen very rarely. Due to her knees she is severely limited in terms of exercise. She confirms that she does snore, which may wake herself from sleep. She does not smoke. She denies any shortness of breath. No lightheadedness, headaches, syncope, orthopnea, or PND.   Past Medical History:  Diagnosis Date   Diabetes mellitus    Dysplastic nevi    Gastroparesis    GERD (gastroesophageal reflux disease)    Hypertension    Melanoma (Jamie Burke)    Snoring 03/28/2022   Tachycardia 03/28/2022    Past Surgical History:  Procedure Laterality Date    ABDOMINAL HYSTERECTOMY     CARPAL TUNNEL RELEASE     BL   TRIGGER FINGER RELEASE      Current Medications: Current Meds  Medication Sig   Accu-Chek Softclix Lancets lancets USE AS INSTRUCTED 3 TIMES  DAILY   atorvastatin (LIPITOR) 20 MG tablet Take 1 tablet (20 mg total) by mouth daily.   Blood Glucose Monitoring Suppl (ACCU-CHEK GUIDE ME) w/Device KIT Use as directed 3 times daily to test blood sugar E11.65   Continuous Blood Gluc Sensor (DEXCOM G6 SENSOR) MISC 1 Device by Does not apply route as directed.   Continuous Blood Gluc Transmit (DEXCOM G6 TRANSMITTER) MISC 1 Device by Does not apply route as directed.   famotidine (PEPCID) 10 MG tablet Take 10 mg by mouth 2 (two) times daily.   gabapentin (NEURONTIN) 100 MG capsule Take 3 capsules (300 mg total) by mouth at bedtime.   glucose blood (ACCU-CHEK GUIDE) test strip USE AS INSTRUCTED TO TEST  BLOOD SUGAR 3 TIMES DAILY   hydrochlorothiazide (HYDRODIURIL) 25 MG tablet hydrochlorothiazide 25 mg tablet   ibuprofen (ADVIL,MOTRIN) 200 MG tablet Take 200 mg by mouth every 6 (six) hours as needed for pain.   insulin glargine, 1 Unit Dial, (TOUJEO SOLOSTAR) 300 UNIT/ML Solostar Pen Inject 50 Units into the skin daily in the afternoon.   insulin lispro (HUMALOG KWIKPEN) 100 UNIT/ML KwikPen Inject 12 Units into the skin 3 (three) times daily.   Insulin Pen Needle (  BD PEN NEEDLE MICRO U/F) 32G X 6 MM MISC 1 Device by Does not apply route in the morning, at noon, in the evening, and at bedtime.   ivabradine (CORLANOR) 5 MG TABS tablet Take 1 tablet by mouth 2 hours prior to CT   metoprolol tartrate (LOPRESSOR) 100 MG tablet Take 1 tablet 2 hours prior to ct   MOBIC 15 MG tablet Take 15 mg by mouth daily.   Semaglutide (RYBELSUS) 3 MG TABS Take 3 mg by mouth daily.   Semaglutide (RYBELSUS) 7 MG TABS Take 7 mg by mouth daily.     Allergies:   Atenolol, Atorvastatin, Canagliflozin, Liraglutide, Lisinopril, Lisinopril-hydrochlorothiazide,  Losartan potassium-hctz, Metformin hcl, Sumatriptan, and Sulfa antibiotics   Social History   Socioeconomic History   Marital status: Married    Spouse name: Not on file   Number of children: Not on file   Years of education: Not on file   Highest education level: Not on file  Occupational History   Not on file  Tobacco Use   Smoking status: Never   Smokeless tobacco: Never  Vaping Use   Vaping Use: Never used  Substance and Sexual Activity   Alcohol use: No   Drug use: No   Sexual activity: Never    Birth control/protection: Surgical  Other Topics Concern   Not on file  Social History Narrative   Not on file   Social Determinants of Health   Financial Resource Strain: Not on file  Food Insecurity: Not on file  Transportation Needs: Not on file  Physical Activity: Not on file  Stress: Not on file  Social Connections: Not on file     Family History: The patient's family history includes Cancer (age of onset: 45) in her brother; Diabetes in her mother; Heart disease in her maternal aunt and mother; Hypertension in her father and mother; Kidney disease in her brother; Stroke in her father and mother.  ROS:   Please see the history of present illness.    (+) Right-sided chest pain (+) Palpitations (+) Nausea (+) Bilateral LE edema (+) Snoring All other systems reviewed and are negative.  EKGs/Labs/Other Studies Reviewed:    The following studies were reviewed today:  Nuclear Stress Test  08/07/2018: The left ventricular ejection fraction is normal (55-65%). Nuclear stress EF: 57%. Blood pressure demonstrated a hypertensive response to exercise. No changes from baseline EKG showing RBBB The study is normal. This is a low risk study.   EKG:   EKG is personally reviewed. 03/28/2022: Sinus tachycardia. Rate 103 bpm. RBBB.  Recent Labs: 07/28/2021: BUN 8; Creatinine, Ser 0.64; Potassium 3.7; Sodium 139   Recent Lipid Panel    Component Value Date/Time   CHOL  183 07/28/2021 1043   TRIG 139.0 07/28/2021 1043   HDL 35.90 (L) 07/28/2021 1043   CHOLHDL 5 07/28/2021 1043   VLDL 27.8 07/28/2021 1043   LDLCALC 119 (H) 07/28/2021 1043   LDLDIRECT 137 (H) 03/15/2011 1128    Physical Exam:    Wt Readings from Last 3 Encounters:  03/28/22 222 lb (100.7 kg)  02/01/22 221 lb (100.2 kg)  07/28/21 221 lb 6.4 oz (100.4 kg)     VS:  BP 132/72   Pulse (!) 103   Ht _0  (1.626 m)   Wt 222 lb (100.7 kg)   BMI 38.11 kg/m  , BMI Body mass index is 38.11 kg/m. GENERAL:  Well appearing HEENT: Pupils equal round and reactive, fundi not visualized, oral mucosa unremarkable  NECK:  No jugular venous distention, waveform within normal limits, carotid upstroke brisk and symmetric, no bruits, no thyromegaly LUNGS:  Clear to auscultation bilaterally HEART:  RRR.  PMI not displaced or sustained,S1 and S2 within normal limits, no S3, no S4, no clicks, no rubs, no murmurs ABD:  Flat, positive bowel sounds normal in frequency in pitch, no bruits, no rebound, no guarding, no midline pulsatile mass, no hepatomegaly, no splenomegaly EXT:  2 plus pulses throughout, no edema, no cyanosis no clubbing SKIN:  No rashes no nodules NEURO:  Cranial nerves II through XII grossly intact, motor grossly intact throughout PSYCH:  Cognitively intact, oriented to person place and time   ASSESSMENT:    1. Primary hypertension   2. Atypical chest pain   3. Dyslipidemia   4. Snoring   5. Tachycardia    PLAN:    HTN (hypertension) Blood pressure has been pretty well-controlled.   Slightly above goal.  Once we evaluate her coronaries, recommend that she be referred to the PREP program.  Atypical chest pain Ms. Pfalzgraf has atypical right-sided chest pain.  Although it is atypical it does occur with exertion and she has risk factors.  We will get a coronary CTA to evaluate.  Given her tachycardia we will give her metoprolol 100 mg and 5 mg of ivabradine.  Dyslipidemia She  recently started on a statin.  She previously did not tolerate statins due to myalgias.  She is willing to continue for now.  Her LDL should be less than 100 at least, ideally less than 70.  We will be able to better evaluate her goal after her coronary CTA.  Snoring We will get a sleep study due to snoring, apnea and daytime somnolence.   Tachycardia She is tachycardic today.  Check TSH and CBC.  Also getting a sleep study as above.        Disposition: FU with APP in 1 month.   Medication Adjustments/Labs and Tests Ordered: Current medicines are reviewed at length with the patient today.  Concerns regarding medicines are outlined above.   Orders Placed This Encounter  Procedures   CT CORONARY MORPH W/CTA COR W/SCORE W/CA W/CM &/OR WO/CM   TSH   CBC with Differential/Platelet   Basic metabolic panel   EKG 09-NATF   Split night study   Meds ordered this encounter  Medications   ivabradine (CORLANOR) 5 MG TABS tablet    Sig: Take 1 tablet by mouth 2 hours prior to CT    Dispense:  1 tablet    Refill:  0   metoprolol tartrate (LOPRESSOR) 100 MG tablet    Sig: Take 1 tablet 2 hours prior to ct    Dispense:  1 tablet    Refill:  0   Patient Instructions  Medication Instructions:  TAKE CORLANOR 5 MG 1 TABLET 2 HOURS PRIOR TO CT  TAKE METOPROLOL 100 MG 1 TABLET 2 HOURS PRIOR TO CT   *If you need a refill on your cardiac medications before your next appointment, please call your pharmacy*  Lab Work: BMET/CBC/TSH TODAY   If you have labs (blood work) drawn today and your tests are completely normal, you will receive your results only by: MyChart Message (if you have MyChart) OR A paper copy in the mail If you have any lab test that is abnormal or we need to change your treatment, we will call you to review the results.  Testing/Procedures: Your physician has requested that you have cardiac CT.  Cardiac computed tomography (CT) is a painless test that uses an x-ray  machine to take clear, detailed pictures of your heart. For further information please visit [email or web address]. Please follow instruction sheet as given.  Follow-Up: At [facility], you and your health needs are our priority.  As part of our continuing mission to provide you with exceptional heart care, we have created designated Provider Care Teams.  These Care Teams include your primary Cardiologist (physician) and Advanced Practice Providers (APPs -  Physician Assistants and Nurse Practitioners) who all work together to provide you with the care you need, when you need it.  We recommend signing up for the patient portal called "MyChart".  Sign up information is provided on this After Visit Summary.  MyChart is used to connect with patients for Virtual Visits (Telemedicine).  Patients are able to view lab/test results, encounter notes, upcoming appointments, etc.  Non-urgent messages can be sent to your provider as well.   To learn more about what you can do with MyChart, go to [email or web address].    Your next appointment:   1 month(s)  The format for your next appointment:   In Person  Provider:   Laurann Montana, NP  Other Instructions    Your cardiac CT will be scheduled at one of the below locations:   [facility] [address] [place], [place] [address] [phone]  [place] [address] [place],  [address] [phone]  If scheduled at [facility], please arrive at the [facility] and Children's Entrance (Entrance C2) of [facility] 30 minutes prior to test start time. You can use the FREE valet parking offered at entrance C (encouraged to control the heart rate for the test)  Proceed to the [facility] Radiology Department (first floor) to check-in and test prep.  All radiology patients and guests should use entrance C2 at [facility], accessed from [facility], even though the hospital's physical address listed is [address]    If scheduled at [facility], please arrive 15 mins early for check-in and test prep.  Please follow these instructions carefully (unless otherwise directed):  Hold all erectile dysfunction medications at least 3 days (72 hrs) prior to test.  On the Night Before the Test: Be sure to Drink plenty of water. Do not consume any caffeinated/decaffeinated beverages or chocolate 12 hours prior to your test. Do not take any antihistamines 12 hours prior to your test. If the patient has contrast allergy: Patient will need a prescription for Prednisone and very clear instructions (as follows): Prednisone 50 mg - take 13 hours prior to test Take another Prednisone 50 mg 7 hours prior to test Take another Prednisone 50 mg 1 hour prior to test Take Benadryl 50 mg 1 hour prior to test Patient must complete all four doses of above prophylactic medications. Patient will need a ride after test due to Benadryl.  On the Day of the Test: Drink plenty of water until 1 hour prior to the test. Do not eat any food 4 hours prior to the test. You may take your regular medications prior to the test.  Take metoprolol (Lopressor) two hours prior to test. HOLD Furosemide/Hydrochlorothiazide morning of the test. FEMALES- please wear underwire-free bra if available, avoid dresses & tight clothing  After the Test: Drink plenty of water. After receiving IV contrast, you may experience a mild flushed feeling. This is normal.  On occasion, you may experience a mild rash up to 24 hours after the test. This is not dangerous. If this occurs, you can take Benadryl 25 mg and increase your fluid intake. If you experience trouble breathing, this can be serious. If it is severe call 911 IMMEDIATELY. If it is mild, please call our office. If you take any of these medications: Glipizide/Metformin, Avandament,  Glucavance, please do not take 48 hours after completing test unless otherwise instructed.  We will call to schedule your test 2-4 weeks out understanding that some insurance companies will need an authorization prior to the service being performed.   For non-scheduling related questions, please contact the cardiac imaging nurse navigator should you have any questions/concerns: Marchia Bond, Cardiac Imaging Nurse Navigator Gordy Clement, Cardiac Imaging Nurse Navigator [facility] Direct Office Dial: [phone]   For scheduling needs, including cancellations and rescheduling, please call [place], [phone].  Cardiac CT Angiogram A cardiac CT angiogram is a procedure to look at the heart and the area around the heart. It may be done to help find the cause of chest pains or other symptoms of heart disease. During this procedure, a substance called contrast dye is injected into the blood vessels in the area to be checked. A large X-ray machine, called a CT scanner, then takes detailed pictures of the heart and the surrounding area. The procedure is also sometimes called a coronary CT angiogram, coronary artery scanning, or CTA. A cardiac CT angiogram allows the health care provider to see how well blood is flowing to and from the heart. The health care provider will be able to see if there are any problems, such as: Blockage or narrowing of the coronary arteries in the heart. Fluid around the heart. Signs of weakness or disease in the muscles, valves, and tissues of the heart. Tell a health care provider about: Any allergies you have. This is especially important if you have had a previous allergic reaction to contrast dye. All medicines you are taking, including vitamins, herbs, eye drops, creams, and over-the-counter medicines. Any blood disorders you have. Any surgeries you have had. Any medical conditions you have. Whether you are pregnant or may be  pregnant. Any anxiety disorders, chronic pain, or other conditions you have that may increase your stress or prevent you from lying still. What are the risks? Generally, this is a safe procedure. However, problems may occur, including: Bleeding. Infection. Allergic reactions to medicines or dyes. Damage to other structures or organs. Kidney damage from the contrast dye that is used. Increased risk of cancer from radiation exposure. This risk is low. Talk with your health care provider about: The risks and benefits of testing. How you can receive the lowest dose of radiation. What happens before the procedure? Wear comfortable clothing and remove any jewelry, glasses, dentures, and hearing aids. Follow instructions from your health care provider about eating and drinking. This may include: For 12 hours before the procedure -- avoid caffeine. This includes tea, coffee, soda, energy drinks, and diet pills. Drink plenty of water or other fluids that do not have caffeine in them. Being well hydrated can prevent complications. For 4-6 hours before the procedure -- stop eating and drinking. The contrast dye can cause nausea, but this is less likely if your stomach is empty. Ask your health care provider about changing or stopping your regular medicines. This is especially important if you are taking diabetes medicines, blood thinners, or medicines to treat problems with  erections (erectile dysfunction). What happens during the procedure?  Hair on your chest may need to be removed so that small sticky patches called electrodes can be placed on your chest. These will transmit information that helps to monitor your heart during the procedure. An IV will be inserted into one of your veins. You might be given a medicine to control your heart rate during the procedure. This will help to ensure that good images are obtained. You will be asked to lie on an exam table. This table will slide in and out of the  CT machine during the procedure. Contrast dye will be injected into the IV. You might feel warm, or you may get a metallic taste in your mouth. You will be given a medicine called nitroglycerin. This will relax or dilate the arteries in your heart. The table that you are lying on will move into the CT machine tunnel for the scan. The person running the machine will give you instructions while the scans are being done. You may be asked to: Keep your arms above your head. Hold your breath. Stay very still, even if the table is moving. When the scanning is complete, you will be moved out of the machine. The IV will be removed. The procedure may vary among health care providers and hospitals. What can I expect after the procedure? After your procedure, it is common to have: A metallic taste in your mouth from the contrast dye. A feeling of warmth. A headache from the nitroglycerin. Follow these instructions at home: Take over-the-counter and prescription medicines only as told by your health care provider. If you are told, drink enough fluid to keep your urine pale yellow. This will help to flush the contrast dye out of your body. Most people can return to their normal activities right after the procedure. Ask your health care provider what activities are safe for you. It is up to you to get the results of your procedure. Ask your health care provider, or the department that is doing the procedure, when your results will be ready. Keep all follow-up visits as told by your health care provider. This is important. Contact a health care provider if: You have any symptoms of allergy to the contrast dye. These include: Shortness of breath. Rash or hives. A racing heartbeat. Summary A cardiac CT angiogram is a procedure to look at the heart and the area around the heart. It may be done to help find the cause of chest pains or other symptoms of heart disease. During this procedure, a large X-ray  machine, called a CT scanner, takes detailed pictures of the heart and the surrounding area after a contrast dye has been injected into blood vessels in the area. Ask your health care provider about changing or stopping your regular medicines before the procedure. This is especially important if you are taking diabetes medicines, blood thinners, or medicines to treat erectile dysfunction. If you are told, drink enough fluid to keep your urine pale yellow. This will help to flush the contrast dye out of your body. This information is not intended to replace advice given to you by your health care provider. Make sure you discuss any questions you have with your health care provider. Document Revised: 06/23/2021 Document Reviewed: 06/05/2019 Elsevier Patient Education  [place].       I,Mathew Stumpf,acting as a Education administrator for Skeet Latch, MD.,have documented all relevant documentation on the behalf of Skeet Latch, MD,as directed by  Jonelle Sidle  Oval Linsey, MD while in the presence of Skeet Latch, MD.  I, Loop Oval Linsey, MD have reviewed all documentation for this visit.  The documentation of the exam, diagnosis, procedures, and orders on 03/28/2022 are all accurate and complete.   Signed, Skeet Latch, MD  03/28/2022 5:04 PM    [place]

## 2022-03-28 NOTE — Telephone Encounter (Signed)
Patient notified and will make adjustments

## 2022-03-28 NOTE — Patient Instructions (Signed)
Medication Instructions:  TAKE CORLANOR 5 MG 1 TABLET 2 HOURS PRIOR TO CT  TAKE METOPROLOL 100 MG 1 TABLET 2 HOURS PRIOR TO CT   *If you need a refill on your cardiac medications before your next appointment, please call your pharmacy*  Lab Work: BMET/CBC/TSH TODAY   If you have labs (blood work) drawn today and your tests are completely normal, you will receive your results only by: MyChart Message (if you have MyChart) OR A paper copy in the mail If you have any lab test that is abnormal or we need to change your treatment, we will call you to review the results.  Testing/Procedures: Your physician has requested that you have cardiac CT. Cardiac computed tomography (CT) is a painless test that uses an x-ray machine to take clear, detailed pictures of your heart. For further information please visit [email or web address]. Please follow instruction sheet as given.  Follow-Up: At [facility], you and your health needs are our priority.  As part of our continuing mission to provide you with exceptional heart care, we have created designated Provider Care Teams.  These Care Teams include your primary Cardiologist (physician) and Advanced Practice Providers (APPs -  Physician Assistants and Nurse Practitioners) who all work together to provide you with the care you need, when you need it.  We recommend signing up for the patient portal called "MyChart".  Sign up information is provided on this After Visit Summary.  MyChart is used to connect with patients for Virtual Visits (Telemedicine).  Patients are able to view lab/test results, encounter notes, upcoming appointments, etc.  Non-urgent messages can be sent to your provider as well.   To learn more about what you can do with MyChart, go to [email or web address].    Your next appointment:   1 month(s)  The format for your next appointment:   In Person  Provider:   Laurann Montana, NP  Other Instructions    Your cardiac CT will be  scheduled at one of the below locations:   [facility] [address] [place], [place] [address] [phone]  [place] [address] [place], [place] [address] [phone]  If scheduled at [facility], please arrive at the [facility] and Children's Entrance (Entrance C2) of [facility] 30 minutes prior to test start time. You can use the FREE valet parking offered at entrance C (encouraged to control the heart rate for the test)  Proceed to the [facility] Radiology Department (first floor) to check-in and test prep.  All radiology patients and guests should use entrance C2 at [facility], accessed from [facility], even though the hospital's physical address listed is [address]    If scheduled at [facility], please arrive 15 mins early for check-in and test prep.  Please follow these instructions carefully (unless otherwise directed):  Hold all erectile dysfunction medications at least 3 days (72 hrs) prior to test.  On the Night Before the Test: Be sure to Drink plenty of water. Do not consume any caffeinated/decaffeinated beverages or chocolate 12 hours prior to your test. Do not take any antihistamines 12 hours prior to your test. If the patient has contrast allergy: Patient will need a prescription for Prednisone and very clear instructions (as follows): Prednisone 50 mg - take 13 hours prior to test Take another Prednisone 50 mg 7 hours prior to test Take another Prednisone 50 mg 1 hour  prior to test Take Benadryl 50 mg 1 hour prior to test Patient must complete all four doses of above prophylactic medications. Patient will need a ride after test due to Benadryl.  On the Day of the Test: Drink plenty of water until 1 hour prior to the test. Do not eat any food 4 hours prior to the test. You may take your regular medications  prior to the test.  Take metoprolol (Lopressor) two hours prior to test. HOLD Furosemide/Hydrochlorothiazide morning of the test. FEMALES- please wear underwire-free bra if available, avoid dresses & tight clothing  After the Test: Drink plenty of water. After receiving IV contrast, you may experience a mild flushed feeling. This is normal. On occasion, you may experience a mild rash up to 24 hours after the test. This is not dangerous. If this occurs, you can take Benadryl 25 mg and increase your fluid intake. If you experience trouble breathing, this can be serious. If it is severe call 911 IMMEDIATELY. If it is mild, please call our office. If you take any of these medications: Glipizide/Metformin, Avandament, Glucavance, please do not take 48 hours after completing test unless otherwise instructed.  We will call to schedule your test 2-4 weeks out understanding that some insurance companies will need an authorization prior to the service being performed.   For non-scheduling related questions, please contact the cardiac imaging nurse navigator should you have any questions/concerns: Marchia Bond, Cardiac Imaging Nurse Navigator Gordy Clement, Cardiac Imaging Nurse Navigator [facility] Direct Office Dial: [phone]   For scheduling needs, including cancellations and rescheduling, please call [place], [phone].  Cardiac CT Angiogram A cardiac CT angiogram is a procedure to look at the heart and the area around the heart. It may be done to help find the cause of chest pains or other symptoms of heart disease. During this procedure, a substance called contrast dye is injected into the blood vessels in the area to be checked. A large X-ray machine, called a CT scanner, then takes detailed pictures of the heart and the surrounding area. The procedure is also sometimes called a coronary CT angiogram, coronary artery scanning, or CTA. A cardiac CT angiogram  allows the health care provider to see how well blood is flowing to and from the heart. The health care provider will be able to see if there are any problems, such as: Blockage or narrowing of the coronary arteries in the heart. Fluid around the heart. Signs of weakness or disease in the muscles, valves, and tissues of the heart. Tell a health care provider about: Any allergies you have. This is especially important if you have had a previous allergic reaction to contrast dye. All medicines you are taking, including vitamins, herbs, eye drops, creams, and over-the-counter medicines. Any blood disorders you have. Any surgeries you have had. Any medical conditions you have. Whether you are pregnant or may be pregnant. Any anxiety disorders, chronic pain, or other conditions you have that may increase your stress or prevent you from lying still. What are the risks? Generally, this is a safe procedure. However, problems may occur, including: Bleeding. Infection. Allergic reactions to medicines or dyes. Damage to other structures or organs. Kidney damage from the contrast dye that is used. Increased risk of cancer from radiation exposure. This risk is low. Talk with your health care provider about: The risks and benefits of testing. How you can receive the lowest dose of radiation. What happens before the procedure? Wear comfortable  clothing and remove any jewelry, glasses, dentures, and hearing aids. Follow instructions from your health care provider about eating and drinking. This may include: For 12 hours before the procedure -- avoid caffeine. This includes tea, coffee, soda, energy drinks, and diet pills. Drink plenty of water or other fluids that do not have caffeine in them. Being well hydrated can prevent complications. For 4-6 hours before the procedure -- stop eating and drinking. The contrast dye can cause nausea, but this is less likely if your stomach is empty. Ask your health  care provider about changing or stopping your regular medicines. This is especially important if you are taking diabetes medicines, blood thinners, or medicines to treat problems with erections (erectile dysfunction). What happens during the procedure?  Hair on your chest may need to be removed so that small sticky patches called electrodes can be placed on your chest. These will transmit information that helps to monitor your heart during the procedure. An IV will be inserted into one of your veins. You might be given a medicine to control your heart rate during the procedure. This will help to ensure that good images are obtained. You will be asked to lie on an exam table. This table will slide in and out of the CT machine during the procedure. Contrast dye will be injected into the IV. You might feel warm, or you may get a metallic taste in your mouth. You will be given a medicine called nitroglycerin. This will relax or dilate the arteries in your heart. The table that you are lying on will move into the CT machine tunnel for the scan. The person running the machine will give you instructions while the scans are being done. You may be asked to: Keep your arms above your head. Hold your breath. Stay very still, even if the table is moving. When the scanning is complete, you will be moved out of the machine. The IV will be removed. The procedure may vary among health care providers and hospitals. What can I expect after the procedure? After your procedure, it is common to have: A metallic taste in your mouth from the contrast dye. A feeling of warmth. A headache from the nitroglycerin. Follow these instructions at home: Take over-the-counter and prescription medicines only as told by your health care provider. If you are told, drink enough fluid to keep your urine pale yellow. This will help to flush the contrast dye out of your body. Most people can return to their normal activities right  after the procedure. Ask your health care provider what activities are safe for you. It is up to you to get the results of your procedure. Ask your health care provider, or the department that is doing the procedure, when your results will be ready. Keep all follow-up visits as told by your health care provider. This is important. Contact a health care provider if: You have any symptoms of allergy to the contrast dye. These include: Shortness of breath. Rash or hives. A racing heartbeat. Summary A cardiac CT angiogram is a procedure to look at the heart and the area around the heart. It may be done to help find the cause of chest pains or other symptoms of heart disease. During this procedure, a large X-ray machine, called a CT scanner, takes detailed pictures of the heart and the surrounding area after a contrast dye has been injected into blood vessels in the area. Ask your health care provider about changing or stopping your  regular medicines before the procedure. This is especially important if you are taking diabetes medicines, blood thinners, or medicines to treat erectile dysfunction. If you are told, drink enough fluid to keep your urine pale yellow. This will help to flush the contrast dye out of your body. This information is not intended to replace advice given to you by your health care provider. Make sure you discuss any questions you have with your health care provider. Document Revised: 06/23/2021 Document Reviewed: 06/05/2019 Elsevier Patient Education  [place].

## 2022-03-29 LAB — CBC WITH DIFFERENTIAL/PLATELET
Basophils Absolute: 0 10*3/uL (ref 0.0–0.2)
Basos: 1 %
EOS (ABSOLUTE): 0.3 10*3/uL (ref 0.0–0.4)
Eos: 4 %
Hematocrit: 40.8 % (ref 34.0–46.6)
Hemoglobin: 12.7 g/dL (ref 11.1–15.9)
Immature Grans (Abs): 0 10*3/uL (ref 0.0–0.1)
Immature Granulocytes: 0 %
Lymphocytes Absolute: 3.3 10*3/uL — ABNORMAL HIGH (ref 0.7–3.1)
Lymphs: 37 %
MCH: 25.7 pg — ABNORMAL LOW (ref 26.6–33.0)
MCHC: 31.1 g/dL — ABNORMAL LOW (ref 31.5–35.7)
MCV: 83 fL (ref 79–97)
Monocytes Absolute: 0.7 10*3/uL (ref 0.1–0.9)
Monocytes: 8 %
Neutrophils Absolute: 4.5 10*3/uL (ref 1.4–7.0)
Neutrophils: 50 %
Platelets: 285 10*3/uL (ref 150–450)
RBC: 4.94 x10E6/uL (ref 3.77–5.28)
RDW: 13.9 % (ref 11.7–15.4)
WBC: 8.8 10*3/uL (ref 3.4–10.8)

## 2022-03-29 LAB — BASIC METABOLIC PANEL
BUN/Creatinine Ratio: 11 — ABNORMAL LOW (ref 12–28)
BUN: 8 mg/dL (ref 8–27)
CO2: 23 mmol/L (ref 20–29)
Calcium: 10.6 mg/dL — ABNORMAL HIGH (ref 8.7–10.3)
Chloride: 103 mmol/L (ref 96–106)
Creatinine, Ser: 0.71 mg/dL (ref 0.57–1.00)
Glucose: 168 mg/dL — ABNORMAL HIGH (ref 70–99)
Potassium: 4.3 mmol/L (ref 3.5–5.2)
Sodium: 142 mmol/L (ref 134–144)
eGFR: 89 mL/min/{1.73_m2} (ref >59)

## 2022-03-29 LAB — TSH: TSH: 1.86 u[IU]/mL (ref 0.450–4.500)

## 2022-04-13 ENCOUNTER — Telehealth (HOSPITAL_BASED_OUTPATIENT_CLINIC_OR_DEPARTMENT_OTHER): Payer: Self-pay | Admitting: Cardiovascular Disease

## 2022-04-13 NOTE — Telephone Encounter (Signed)
Pt c/o medication issue:  1. Name of Medication: ivabradine (CORLANOR) 5 MG TABS tablet  2. How are you currently taking this medication (dosage and times per day)? Take 1 tablet by mouth 2 hours prior to CT  3. Are you having a reaction (difficulty breathing--STAT)? no  4. What is your medication issue? Patient states that his medication needs authrozation from [place]. So that the insurance will approve it. Please advise

## 2022-04-13 NOTE — Telephone Encounter (Signed)
Spoke with patient and she will pay out of pocket for the one pill

## 2022-04-18 ENCOUNTER — Telehealth (HOSPITAL_COMMUNITY): Payer: Self-pay | Admitting: Emergency Medicine

## 2022-04-19 ENCOUNTER — Encounter (HOSPITAL_COMMUNITY): Payer: Self-pay

## 2022-04-19 ENCOUNTER — Ambulatory Visit (HOSPITAL_COMMUNITY)
Admission: RE | Admit: 2022-04-19 | Discharge: 2022-04-19 | Disposition: A | Discharge status: Home / Self Care | Payer: Medicare Other | Source: Ambulatory Visit | Attending: Cardiovascular Disease | Admitting: Cardiovascular Disease

## 2022-04-19 DIAGNOSIS — R0789 Other chest pain: Secondary | ICD-10-CM | POA: Diagnosis not present

## 2022-04-19 DIAGNOSIS — I2510 Atherosclerotic heart disease of native coronary artery without angina pectoris: Secondary | ICD-10-CM | POA: Diagnosis not present

## 2022-04-19 DIAGNOSIS — I10 Essential (primary) hypertension: Secondary | ICD-10-CM | POA: Insufficient documentation

## 2022-04-19 DIAGNOSIS — R000 Tachycardia, unspecified: Secondary | ICD-10-CM | POA: Insufficient documentation

## 2022-04-19 MED ORDER — NITROGLYCERIN 0.4 MG SL SUBL
0.8000 mg | SUBLINGUAL_TABLET | Freq: Once | SUBLINGUAL | Status: AC
  Administered 2022-04-19: 0.8 mg via SUBLINGUAL

## 2022-04-19 MED ORDER — NITROGLYCERIN 0.4 MG SL SUBL
SUBLINGUAL_TABLET | SUBLINGUAL | Status: AC
  Filled 2022-04-19: qty 2

## 2022-04-19 MED ORDER — IOHEXOL 350 MG/ML SOLN
100.0000 mL | Freq: Once | INTRAVENOUS | Status: AC | PRN
  Administered 2022-04-19: 100 mL via INTRAVENOUS

## 2022-04-20 ENCOUNTER — Telehealth (HOSPITAL_BASED_OUTPATIENT_CLINIC_OR_DEPARTMENT_OTHER): Payer: Self-pay | Admitting: Cardiovascular Disease

## 2022-04-20 NOTE — Telephone Encounter (Signed)
RN returned call to patient and informed patient that preliminary results are available but have not been reviewed by Dr. Oval Linsey at this time and that we will be in touch when the results are available. Patient frustrated that she cannot schedule for the next couple weeks despite being told to schedule her appointment before leaving her appointment on 6/5.

## 2022-04-20 NOTE — Telephone Encounter (Signed)
Patient is looking for results of CT test.

## 2022-04-20 NOTE — Telephone Encounter (Signed)
Spoke with patient, scheduled follow up with Overton Mam NP 7/5

## 2022-04-21 ENCOUNTER — Telehealth: Payer: Self-pay

## 2022-04-21 ENCOUNTER — Telehealth (HOSPITAL_BASED_OUTPATIENT_CLINIC_OR_DEPARTMENT_OTHER): Payer: Self-pay

## 2022-04-21 DIAGNOSIS — E785 Hyperlipidemia, unspecified: Secondary | ICD-10-CM

## 2022-04-21 NOTE — Telephone Encounter (Signed)
Patient advised on medication adjustment and verbalized understanding.

## 2022-04-21 NOTE — Telephone Encounter (Signed)
Patient states that Humalog is not working.She states that her sugar are over the 200 every afternoon.  Patient has been doing the 60 units BID.   Sugar   04/20/22 181   04/19/22 171

## 2022-04-21 NOTE — Telephone Encounter (Addendum)
Results called to patient who verbalizes understanding, labs ordered and mailed to patient. Patient requested referral to prep program.   Confirmed patient appointment and location for labs.      ----- Message from Skeet Latch, MD sent at 04/20/2022  6:29 PM EDT ----- Study shows that she has more plaque than expected for her age and gender but no major blockages.  Recommend increasing exercise to at least 150 minutes weekly, continuing statin with LDL goal <70.   Repeat lipids/CMP in 2-3 months to see where we stand.

## 2022-04-22 ENCOUNTER — Telehealth: Payer: Self-pay

## 2022-04-22 NOTE — Telephone Encounter (Signed)
Called to discuss PREP program; she wants to attend, not sure which facility, but wants to know schedule for [facility] in July; will have [place] contact her to provide that information.

## 2022-04-26 NOTE — Progress Notes (Unsigned)
Office Visit    Patient Name: NATTIE LAZENBY Date of Encounter: 04/27/2022  PCP:  Donald Prose, [place]  Cardiologist:  Skeet Latch, MD Advanced Practice Provider:  No care team member to display Electrophysiologist:  None      Chief Complaint    Jamie Burke is a 76 y.o. female presents today for follow up after cardiac CTA   Past Medical History    Past Medical History:  Diagnosis Date   Diabetes mellitus    Dysplastic nevi    Gastroparesis    GERD (gastroesophageal reflux disease)    Hypertension    Melanoma ([place])    Snoring 03/28/2022   Tachycardia 03/28/2022   Past Surgical History:  Procedure Laterality Date   ABDOMINAL HYSTERECTOMY     CARPAL TUNNEL RELEASE     BL   TRIGGER FINGER RELEASE      Allergies  Allergies  Allergen Reactions   Atenolol Other (See Comments)   Atorvastatin Other (See Comments)   Canagliflozin Other (See Comments)   Liraglutide Other (See Comments)   Lisinopril Swelling   Lisinopril-Hydrochlorothiazide Other (See Comments)   Losartan Potassium-Hctz Other (See Comments)   Metformin Hcl Other (See Comments)   Sumatriptan Other (See Comments)    Very crazy dreams    Sulfa Antibiotics Rash    History of Present Illness    AVERLEE Burke is a 76 y.o. female with a hx of CAD, HLD, RBBB, HTN, diabetes, GERD, gastroparesis last seen 03/28/22 by Dr. Oval Linsey.  Prior patient of Dr. Bettina Gavia. Re-established with Dr. Oval Linsey 03/28/22. Coronary CT ordered due to chest pain. Prior myoview 2019 low risk with EF 57%.  Also noted LE swelling which she attributed to her diabetes. BP often in the 130s. Sleep study also ordered.   Cardiac CTA 04/19/22 with calcium score 170 placing her in 77th percentile for age/sex. Nonobstructive coronary disease (ost LAD 25-49%, prox/mid LAD 1-25%, OM1 25-49%, AV groove 25-49%. Previous intolerance to statin due to myalgia.   Presents today for follow up independently.  Reviewed cardiac CTA in detail and secondary prevention. Reports no shortness of breath at rest and stable dyspnea on exertion which she attributes to inactivity. No exercise routine. Reports no chest pain, pressure, or tightness. No edema, orthopnea, PND. Reports no palpitations.    EKGs/Labs/Other Studies Reviewed:   The following studies were reviewed today:  Cardiac CTA 04/19/22 FINDINGS: Non-cardiac: See separate report from [facility]. No significant findings on limited lung and soft tissue windows.   Calcium score: Calcium noted in LAD and OM   LM: 0   RCA: 0   LCX: 40   LAD 1130   Total: 170 which is 43 th percentile for age/sex   Coronary Arteries: Right dominant with no anomalies   LM: Normal   LAD: 25-49% mixed plaque in ostial LAD 1-24% calcified plaque in proximal and mid LAD   D1: Normal   Circumflex: Normal   OM1: 25-49% mixed plaque in proximal vessel   AV groove : Small vessel 25-49% soft plaque   RCA:  Normal   PDA: Normal   PLA: Normal   IMPRESSION: 1. Calcium score 170 which is 77 th percentile for age/sex   2.  Normal ascending thoracic aorta 3.0 cm   3.  CAD RADS 2 non obstructive CAD see description above  Nuclear Stress Test  08/07/2018: The left ventricular ejection fraction is normal (55-65%). Nuclear stress EF: 57%. Blood  pressure demonstrated a hypertensive response to exercise. No changes from baseline EKG showing RBBB The study is normal. This is a low risk study.    EKG:  No EKG today.   Recent Labs: 03/28/2022: BUN 8; Creatinine, Ser 0.71; Hemoglobin 12.7; Platelets 285; Potassium 4.3; Sodium 142; TSH 1.860  Recent Lipid Panel    Component Value Date/Time   CHOL 183 07/28/2021 1043   TRIG 139.0 07/28/2021 1043   HDL 35.90 (L) 07/28/2021 1043   CHOLHDL 5 07/28/2021 1043   VLDL 27.8 07/28/2021 1043   LDLCALC 119 (H) 07/28/2021 1043   LDLDIRECT 137 (H) 03/15/2011 1128   Home Medications   Current Meds   Medication Sig   Accu-Chek Softclix Lancets lancets USE AS INSTRUCTED 3 TIMES  DAILY   Blood Glucose Monitoring Suppl (ACCU-CHEK GUIDE ME) w/Device KIT Use as directed 3 times daily to test blood sugar E11.65   Continuous Blood Gluc Sensor (DEXCOM G6 SENSOR) MISC 1 Device by Does not apply route as directed.   Continuous Blood Gluc Transmit (DEXCOM G6 TRANSMITTER) MISC 1 Device by Does not apply route as directed.   dicyclomine (BENTYL) 20 MG tablet TAKE 1 TABLET BY MOUTH THREE TIMES DAILY AS NEEDED FOR CRAMPS WITH FOOD   famotidine (PEPCID) 10 MG tablet Take 10 mg by mouth as needed.   glucose blood (ACCU-CHEK GUIDE) test strip USE AS INSTRUCTED TO TEST  BLOOD SUGAR 3 TIMES DAILY   hydrochlorothiazide (HYDRODIURIL) 25 MG tablet Take 25 mg by mouth every other day.   ibuprofen (ADVIL,MOTRIN) 200 MG tablet Take 200 mg by mouth every 6 (six) hours as needed for pain.   insulin lispro (HUMALOG KWIKPEN) 100 UNIT/ML KwikPen Inject 12 Units into the skin 3 (three) times daily.   Insulin Pen Needle (BD PEN NEEDLE MICRO U/F) 32G X 6 MM MISC 1 Device by Does not apply route in the morning, at noon, in the evening, and at bedtime.   ivabradine (CORLANOR) 5 MG TABS tablet Take 1 tablet by mouth 2 hours prior to CT   metoprolol tartrate (LOPRESSOR) 100 MG tablet Take 1 tablet 2 hours prior to ct   MOBIC 15 MG tablet Take 15 mg by mouth daily.   simvastatin (ZOCOR) 10 MG tablet Take 10 mg by mouth at bedtime.     Review of Systems      All other systems reviewed and are otherwise negative except as noted above.  Physical Exam    VS:  BP 140/80 (BP Location: Right Arm, Patient Position: Sitting, Cuff Size: Large)   Pulse 87   Ht _0  (1.626 m)   Wt 224 lb 1.6 oz (101.7 kg)   SpO2 95%   BMI 38.47 kg/m  , BMI Body mass index is 38.47 kg/m.  Wt Readings from Last 3 Encounters:  04/27/22 224 lb 1.6 oz (101.7 kg)  03/28/22 222 lb (100.7 kg)  02/01/22 221 lb (100.2 kg)     GEN: Well nourished,  well developed, in no acute distress. HEENT: normal. Neck: Supple, no JVD, carotid bruits, or masses. Cardiac: RRR, no murmurs, rubs, or gallops. No clubbing, cyanosis, edema.  Radials/PT 2+ and equal bilaterally.  Respiratory:  Respirations regular and unlabored, clear to auscultation bilaterally. GI: Soft, nontender, nondistended. MS: No deformity or atrophy. Skin: Warm and dry, no rash. Neuro:  Strength and sensation are intact. Psych: Normal affect.  Assessment & Plan    CAD -  Stable with no anginal symptoms. No indication for ischemic evaluation.  GDMT includes Simvastatin. Has been referred to PREP. Heart healthy diet and regular cardiovascular exercise encouraged.    HTN - BP not at goal <130/80. Taking HCTZ QOD per her PCP, discussed that this would not provide optimal coverage. Hx of angioedema with LIsinopril, intolerance to Losartan/Amlodipine, Atenolol. Discussed changing HCTZ to 12.80m QD but she is hesitant regarding medications changes. She is agreeable to monitor at home for 2 weeks.   HLD, LDL goal <70 - Recently started back on Simvastatin. Previously intolerant Pravastatin (2104mand 4040mand Atorvastatin (68m61m Notes in the morning she loses her balance which she attributes to statin - discussed more likely related to neuropathy. 02/2022 LDL 132. Discussed LDL goal <70. Lengthy history of multiple medication intolerances. Will refer to PharmD for lipid management. In the interim, she is agreeable to Simvastatin three times per week for 2 weeks then daily.   DM2 - Continue to follow with PCP. Does have neuopathy associated with her diabetes.   Snoring - Upcoming split night sleep study 05/04/22.   Disposition: Follow up in 3 month(s) with TiffSkeet Latch or APP.  Signed, CaitLoel Dubonnet 04/27/2022, 8:34 AM [facility]

## 2022-04-27 ENCOUNTER — Ambulatory Visit (HOSPITAL_BASED_OUTPATIENT_CLINIC_OR_DEPARTMENT_OTHER): Payer: Medicare Other | Admitting: Family

## 2022-04-27 ENCOUNTER — Encounter (HOSPITAL_BASED_OUTPATIENT_CLINIC_OR_DEPARTMENT_OTHER): Payer: Self-pay | Admitting: Family

## 2022-04-27 VITALS — BP 140/80 | HR 87 | Ht 64.0 in | Wt 224.1 lb

## 2022-04-27 DIAGNOSIS — R0683 Snoring: Secondary | ICD-10-CM | POA: Diagnosis not present

## 2022-04-27 DIAGNOSIS — E785 Hyperlipidemia, unspecified: Secondary | ICD-10-CM

## 2022-04-27 DIAGNOSIS — I25118 Atherosclerotic heart disease of native coronary artery with other forms of angina pectoris: Secondary | ICD-10-CM

## 2022-04-27 DIAGNOSIS — I10 Essential (primary) hypertension: Secondary | ICD-10-CM | POA: Diagnosis not present

## 2022-04-27 NOTE — Patient Instructions (Signed)
Medication Instructions:  Your physician has recommended you make the following change in your medication:   TRY Simvastatin 3 times per week for 2 weeks THEN increase to daily  *If you need a refill on your cardiac medications before your next appointment, please call your pharmacy*  Lab Work: None ordered today.   Testing/Procedures: Your cardiac CTA showed some plaque but no reduced blood flow.   Follow-Up: At [facility], you and your health needs are our priority.  As part of our continuing mission to provide you with exceptional heart care, we have created designated Provider Care Teams.  These Care Teams include your primary Cardiologist (physician) and Advanced Practice Providers (APPs -  Physician Assistants and Nurse Practitioners) who all work together to provide you with the care you need, when you need it.  We recommend signing up for the patient portal called "MyChart".  Sign up information is provided on this After Visit Summary.  MyChart is used to connect with patients for Virtual Visits (Telemedicine).  Patients are able to view lab/test results, encounter notes, upcoming appointments, etc.  Non-urgent messages can be sent to your provider as well.   To learn more about what you can do with MyChart, go to [email or web address].    Your next appointment:   3 month(s)  The format for your next appointment:   In Person  Provider:   Skeet Latch, MD    Other Instructions  Heart Healthy Diet Recommendations: A low-salt diet is recommended. Meats should be grilled, baked, or boiled. Avoid fried foods. Focus on lean protein sources like fish or chicken with vegetables and fruits. The American Heart Association is a Microbiologist!  American Heart Association Diet and Lifeystyle Recommendations   Exercise recommendations: The American Heart Association recommends 150 minutes of moderate intensity exercise weekly. Try 30 minutes of moderate intensity exercise 4-5  times per week. This could include walking, jogging, or swimming.  Important Information About Sugar      Heart-Healthy Eating Plan Many factors influence your heart (coronary) health, including eating and exercise habits. Coronary risk increases with abnormal blood fat (lipid) levels. Heart-healthy meal planning includes limiting unhealthy fats, increasing healthy fats, and making other diet and lifestyle changes. What are tips for following this plan? Cooking Cook foods using methods other than frying. Baking, boiling, grilling, and broiling are all good options. Other ways to reduce fat include: Removing the skin from poultry. Removing all visible fats from meats. Steaming vegetables in water or broth. Meal planning  At meals, imagine dividing your plate into fourths: Fill one-half of your plate with vegetables and green salads. Fill one-fourth of your plate with whole grains. Fill one-fourth of your plate with lean protein foods. Eat 4-5 servings of vegetables per day. One serving equals 1 cup raw or cooked vegetable, or 2 cups raw leafy greens. Eat 4-5 servings of fruit per day. One serving equals 1 medium whole fruit,  cup dried fruit,  cup fresh, frozen, or canned fruit, or  cup 100% fruit juice. Eat more foods that contain soluble fiber. Examples include apples, broccoli, carrots, beans, peas, and barley. Aim to get 25-30 g of fiber per day. Increase your consumption of legumes, nuts, and seeds to 4-5 servings per week. One serving of dried beans or legumes equals  cup cooked, 1 serving of nuts is  cup, and 1 serving of seeds equals 1 tablespoon. Fats Choose healthy fats more often. Choose monounsaturated and polyunsaturated fats, such as olive and canola oils,  flaxseeds, walnuts, almonds, and seeds. Eat more omega-3 fats. Choose salmon, mackerel, sardines, tuna, flaxseed oil, and ground flaxseeds. Aim to eat fish at least 2 times each week. Check food labels carefully to  identify foods with trans fats or high amounts of saturated fat. Limit saturated fats. These are found in animal products, such as meats, butter, and cream. Plant sources of saturated fats include palm oil, palm kernel oil, and coconut oil. Avoid foods with partially hydrogenated oils in them. These contain trans fats. Examples are stick margarine, some tub margarines, cookies, crackers, and other baked goods. Avoid fried foods. General information Eat more home-cooked food and less restaurant, buffet, and fast food. Limit or avoid alcohol. Limit foods that are high in starch and sugar. Lose weight if you are overweight. Losing just 5-10% of your body weight can help your overall health and prevent diseases such as diabetes and heart disease. Monitor your salt (sodium) intake, especially if you have high blood pressure. Talk with your health care provider about your sodium intake. Try to incorporate more vegetarian meals weekly. What foods can I eat? Fruits All fresh, canned (in natural juice), or frozen fruits. Vegetables Fresh or frozen vegetables (raw, steamed, roasted, or grilled). Green salads. Grains Most grains. Choose whole wheat and whole grains most of the time. Rice and pasta, including brown rice and pastas made with whole wheat. Meats and other proteins Lean, well-trimmed beef, veal, pork, and lamb. Chicken and [place] without skin. All fish and shellfish. Wild duck, rabbit, pheasant, and venison. Egg whites or low-cholesterol egg substitutes. Dried beans, peas, lentils, and tofu. Seeds and most nuts. Dairy Low-fat or nonfat cheeses, including ricotta and mozzarella. Skim or 1% milk (liquid, powdered, or evaporated). Buttermilk made with low-fat milk. Nonfat or low-fat yogurt. Fats and oils Non-hydrogenated (trans-free) margarines. Vegetable oils, including soybean, sesame, sunflower, olive, peanut, safflower, corn, canola, and cottonseed. Salad dressings or mayonnaise made with a  vegetable oil. Beverages Water (mineral or sparkling). Coffee and tea. Diet carbonated beverages. Sweets and desserts Sherbet, gelatin, and fruit ice. Small amounts of dark chocolate. Limit all sweets and desserts. Seasonings and condiments All seasonings and condiments. The items listed above may not be a complete list of foods and beverages you can eat. Contact a dietitian for more options. What foods are not recommended? Fruits Canned fruit in heavy syrup. Fruit in cream or butter sauce. Fried fruit. Limit coconut. Vegetables Vegetables cooked in cheese, cream, or butter sauce. Fried vegetables. Grains Breads made with saturated or trans fats, oils, or whole milk. Croissants. Sweet rolls. Donuts. High-fat crackers, such as cheese crackers. Meats and other proteins Fatty meats, such as hot dogs, ribs, sausage, bacon, rib-eye roast or steak. High-fat deli meats, such as salami and bologna. Caviar. Domestic duck and goose. Organ meats, such as liver. Dairy Cream, sour cream, cream cheese, and creamed cottage cheese. Whole-milk cheeses. Whole or 2% milk (liquid, evaporated, or condensed). Whole buttermilk. Cream sauce or high-fat cheese sauce. Whole-milk yogurt. Fats and oils Meat fat, or shortening. Cocoa butter, hydrogenated oils, palm oil, coconut oil, palm kernel oil. Solid fats and shortenings, including bacon fat, salt pork, lard, and butter. Nondairy cream substitutes. Salad dressings with cheese or sour cream. Beverages Regular sodas and any drinks with added sugar. Sweets and desserts Frosting. Pudding. Cookies. Cakes. Pies. Milk chocolate or white chocolate. Buttered syrups. Full-fat ice cream or ice cream drinks. The items listed above may not be a complete list of foods and beverages to avoid. Contact  a dietitian for more information. Summary Heart-healthy meal planning includes limiting unhealthy fats, increasing healthy fats, and making other diet and lifestyle changes. Lose  weight if you are overweight. Losing just 5-10% of your body weight can help your overall health and prevent diseases such as diabetes and heart disease. Focus on eating a balance of foods, including fruits and vegetables, low-fat or nonfat dairy, lean protein, nuts and legumes, whole grains, and heart-healthy oils and fats. This information is not intended to replace advice given to you by your health care provider. Make sure you discuss any questions you have with your health care provider. Document Revised: 02/18/2021 Document Reviewed: 02/18/2021 Elsevier Patient Education  2022 [employer].

## 2022-04-29 ENCOUNTER — Telehealth: Payer: Self-pay

## 2022-04-29 NOTE — Telephone Encounter (Signed)
Call to patient reference next PREP class starting on 05/23/22 M/W 1p-215p  Can do that class.  Will call her closer to start of class to do intake

## 2022-05-04 ENCOUNTER — Telehealth: Payer: Self-pay

## 2022-05-04 ENCOUNTER — Encounter (HOSPITAL_BASED_OUTPATIENT_CLINIC_OR_DEPARTMENT_OTHER): Payer: Medicare Other | Admitting: Cardiology

## 2022-05-04 NOTE — Telephone Encounter (Signed)
Patient left vm that her sugar are still in the 200 everyday and wants to know are there any other medication options.

## 2022-05-04 NOTE — Telephone Encounter (Signed)
Patient advise on medication adjustment and scheduled for sooner appointment.

## 2022-05-12 ENCOUNTER — Telehealth: Payer: Self-pay

## 2022-05-12 NOTE — Telephone Encounter (Signed)
Intake scheduled for next PREP class at [facility] 7/26 at 4pm

## 2022-05-13 ENCOUNTER — Telehealth (HOSPITAL_BASED_OUTPATIENT_CLINIC_OR_DEPARTMENT_OTHER): Payer: Self-pay

## 2022-05-13 MED ORDER — EZETIMIBE 10 MG PO TABS: 10.0000 mg | ORAL_TABLET | Freq: Every day | ORAL | 3 refills | Status: DC

## 2022-05-13 NOTE — Telephone Encounter (Signed)
Recommend purchase BP cuff as discussed in clinic. She can call her insurance to see if they will cover.   Stop Simvastatin. Start Zetia '10mg'$  QD. This is a non-statin medication that helps lower cholesterol.   Loel Dubonnet, NP

## 2022-05-13 NOTE — Addendum Note (Signed)
Addended by: Gerald Stabs on: 05/13/2022 04:35 PM   Modules accepted: Orders

## 2022-05-13 NOTE — Telephone Encounter (Addendum)
Patient states her blood pressure machine is broken and she has not bought another one. Patient also states that the simvastatin is still bothering her ankles.     ----- Message from Loel Dubonnet, NP sent at 05/13/2022  2:58 PM EDT ----- Can we call to get BP log and make sure she was able to tolerate re-initiation of Simvastatin?  TY! ----- Message ----- From: Loel Dubonnet, NP Sent: 05/11/2022  12:00 AM EDT To: Loel Dubonnet, NP  How is BP ? TOlerating simvastatin?

## 2022-05-13 NOTE — Telephone Encounter (Signed)
RN returned call to patient and provided the following recommendations, patient verbalizes understanding and will work on the recommendations provided.      "Recommend purchase BP cuff as discussed in clinic. She can call her insurance to see if they will cover.    Stop Simvastatin. Start Zetia '10mg'$  QD. This is a non-statin medication that helps lower cholesterol.    Loel Dubonnet, NP "

## 2022-05-19 NOTE — Progress Notes (Signed)
[facility] PREP Evaluation  Patient Details  Name: Jamie Burke MRN: [id number] Date of Birth: Mar 13, 1946 Age: 76 y.o. PCP: Donald Prose, MD  Vitals:   05/18/22 1600  BP: 118/70  Pulse: 85  SpO2: 97%  Weight: 219 lb 12.8 oz (99.7 kg)     [facility] Eval - 05/19/22 1700       [facility] "PREP" Location   [facility] "PREP" Location [facility]      Referral    Referring Provider Oval Linsey    Reason for referral Hypertension;Diabetes    Program Start Date 05/23/22   MW 1p-215p x 12 wks     Measurement   Waist Circumference 53 inches    Hip Circumference 55 inches    Body fat 47.4 percent      Information for Trainer   Goals Lose wt 50-70 lbs, get inches off waist. Initial goal: 20 lbs    Current Exercise none    Orthopedic Concerns rotator cuff, bil knees painful, Left hip pain    Pertinent Medical History HTN, IDDM,    Current Barriers none    Restrictions/Precautions Diabetic snack before exercise    Medications that affect exercise Medication causing dizziness/drowsiness      Timed Up and Go (TUGS)   Timed Up and Go Low risk <9 seconds      Mobility and Daily Activities   I find it easy to walk up or down two or more flights of stairs. 2    I have no trouble taking out the trash. 4    I do housework such as vacuuming and dusting on my own without difficulty. 2    I can easily lift a gallon of milk (8lbs). 4    I can easily walk a mile. 1    I have no trouble reaching into high cupboards or reaching down to pick up something from the floor. 2    I do not have trouble doing out-door work such as Armed forces logistics/support/administrative officer, raking leaves, or gardening. 2      Mobility and Daily Activities   I feel younger than my age. 4    I feel independent. 4    I feel energetic. 2    I live an active life.  1    I feel strong. 2    I feel healthy. 1    I feel active as other people my age. 2      How fit and strong are you.   Fit and Strong Total Score 33            Past Medical History:   Diagnosis Date   Diabetes mellitus    Dysplastic nevi    Gastroparesis    GERD (gastroesophageal reflux disease)    Hypertension    Melanoma ([place])    Snoring 03/28/2022   Tachycardia 03/28/2022   Past Surgical History:  Procedure Laterality Date   ABDOMINAL HYSTERECTOMY     CARPAL TUNNEL RELEASE     BL   TRIGGER FINGER RELEASE     Social History   Tobacco Use  Smoking Status Never  Smokeless Tobacco Never    Barnett Hatter 05/19/2022, 5:10 PM

## 2022-05-24 NOTE — Progress Notes (Signed)
[facility] PREP Weekly Session  Patient Details  Name: EZME DUCH MRN: [id number] Date of Birth: April 05, 1946 Age: 76 y.o. PCP: Donald Prose, MD  There were no vitals filed for this visit.   [facility] Weekly seesion - 05/24/22 1700       [facility] "PREP" Location   [facility] "PREP" [facility]      Weekly Session   Topic Discussed Goal setting and welcome to the program   Scale of perceived exertion and target heart rate   Classes attended to date [place] 05/24/2022, 5:06 PM

## 2022-05-26 ENCOUNTER — Telehealth: Payer: Self-pay

## 2022-05-26 NOTE — Telephone Encounter (Signed)
Spoke with [employer] and advised that patient is no longer using the [employer].

## 2022-05-31 NOTE — Progress Notes (Signed)
[facility] PREP Weekly Session  Patient Details  Name: Jamie Burke MRN: [id number] Date of Birth: 1946/06/11 Age: 76 y.o. PCP: Donald Prose, MD  Vitals:   05/30/22 1300  Weight: 220 lb 12.8 oz (100.2 kg)     [facility] Weekly seesion - 05/31/22 1600       [facility] "PREP" Location   [facility] "PREP" Location [facility]      Weekly Session   Topic Discussed Importance of resistance training;Other ways to be active    Minutes exercised this week 45 minutes    Classes attended to date [place] 05/31/2022, 4:47 PM

## 2022-06-07 DIAGNOSIS — M170 Bilateral primary osteoarthritis of knee: Secondary | ICD-10-CM | POA: Diagnosis not present

## 2022-06-17 ENCOUNTER — Encounter: Payer: Self-pay | Admitting: Internal Medicine

## 2022-06-17 ENCOUNTER — Ambulatory Visit: Payer: Medicare Other | Admitting: Internal Medicine

## 2022-06-17 VITALS — BP 136/78 | HR 93 | Ht 64.0 in | Wt 219.8 lb

## 2022-06-17 DIAGNOSIS — E1165 Type 2 diabetes mellitus with hyperglycemia: Secondary | ICD-10-CM | POA: Diagnosis not present

## 2022-06-17 DIAGNOSIS — Z794 Long term (current) use of insulin: Secondary | ICD-10-CM | POA: Diagnosis not present

## 2022-06-17 DIAGNOSIS — E1142 Type 2 diabetes mellitus with diabetic polyneuropathy: Secondary | ICD-10-CM | POA: Diagnosis not present

## 2022-06-17 LAB — POCT GLYCOSYLATED HEMOGLOBIN (HGB A1C): Hemoglobin A1C: 8.8 % — AB (ref 4.0–5.6)

## 2022-06-17 LAB — POCT GLUCOSE (DEVICE FOR HOME USE): POC Glucose: 301 mg/dl — AB (ref 70–99)

## 2022-06-17 MED ORDER — INSULIN LISPRO PROT & LISPRO (75-25 MIX) 100 UNIT/ML KWIKPEN: PEN_INJECTOR | SUBCUTANEOUS | 3 refills | Status: DC

## 2022-06-17 MED ORDER — BD PEN NEEDLE MICRO U/F 32G X 6 MM MISC
1.0000 | Freq: Four times a day (QID) | 3 refills | Status: DC
Start: 2022-06-17 — End: 2022-11-11

## 2022-06-17 NOTE — Patient Instructions (Addendum)
-   Humalog Mix  80 units with Breakfast and 60 units with Supper    On the days of exercise take 40 units of insulin BEFORE Breakfast but continue 60 units with Supper  that day   HOW TO TREAT LOW BLOOD SUGARS (Blood sugar LESS THAN 70 MG/DL) Please follow the RULE OF 15 for the treatment of hypoglycemia treatment (when your (blood sugars are less than 70 mg/dL)   STEP 1: Take 15 grams of carbohydrates when your blood sugar is low, which includes:  3-4 GLUCOSE TABS  OR 3-4 OZ OF JUICE OR REGULAR SODA OR ONE TUBE OF GLUCOSE GEL    STEP 2: RECHECK blood sugar in 15 MINUTES STEP 3: If your blood sugar is still low at the 15 minute recheck --> then, go back to STEP 1 and treat AGAIN with another 15 grams of carbohydrates.

## 2022-06-17 NOTE — Progress Notes (Signed)
Name: Jamie Burke  Age/ Sex: 76 y.o., female   MRN/ DOB: [id number], 02-09-1946     PCP: Donald Prose, MD   Reason for Endocrinology Evaluation: Type 2 Diabetes Mellitus  Initial Endocrine Consultative Visit: 12/25/2019    PATIENT IDENTIFIER: Jamie Burke is a 76 y.o. female with a past medical history of T2DM and HTN. The patient has followed with Endocrinology clinic since 12/25/2019 for consultative assistance with management of her diabetes.  DIABETIC HISTORY:  Ms. Marsan was diagnosed with DM > 20 yr ago. She has been on various  glycemic agents (metformin- weakness in the legs and incontinence, invokana - weakness in the legs. Victoza , Trulicity , she does not recall any side effects ). Her hemoglobin A1c has ranged from 8.6% in 2012, peaking at 8.9% in 2021.  On her initial visit to our clinic she had an A1c of 9.2%, she was on insulin mix and had metformin but she was not taking it due to GI effects.   She had tried Rybelsus twice the first time she stopped it in October 2022 due to leg pains, the second time she stopped it in May 2023 due to lip swelling   By April 2023 and due to persistent hyperglycemia we will switch Humalog mix to basal/prandial, by May 2023 she called back stating lip swelling with Toujeo and Rybelsus and persistent hyperglycemia.  She returned to using insulin mix and stopped Rybelsus  SUBJECTIVE:     During the last visit (02/01/2022): A1c 8.8 %.  We stopped insulin mix and switched her to Toujeo/Humalog will be started Rybelsus and filled out patient assistance papers    Today (06/17/2022): [name] is here for a follow up on diabetes management.  She checks her blood sugars 2 times daily. She did not bring her meter today . She did have a hypoglycemic episode 2 nights    This Am BG 188 mg/dL  She has sloppy joe and lemonade this morning   [facility]  She had a cortisone injection last Tuesday  She has GI  symptoms last week with cramps and loose stool but has chronic GI  issues     HOME DIABETES REGIMEN:  Humalog Mix 75 units with Breakfast and 75 units with Supper     METER DOWNLOAD SUMMARY: forgot her meter     DIABETIC COMPLICATIONS: Microvascular complications:   Denies: CKD, retinopathy , neuropathy  Last eye exam: [facility] 9/18 /2023   Macrovascular complications:    Denies: CAD, PVD, CVA     HISTORY:  Past Medical History:  Past Medical History:  Diagnosis Date   Diabetes mellitus    Dysplastic nevi    Gastroparesis    GERD (gastroesophageal reflux disease)    Hypertension    Melanoma ([place])    Snoring 03/28/2022   Tachycardia 03/28/2022   Past Surgical History:  Past Surgical History:  Procedure Laterality Date   ABDOMINAL HYSTERECTOMY     CARPAL TUNNEL RELEASE     BL   TRIGGER FINGER RELEASE     Social History:  reports that she has never smoked. She has never used smokeless tobacco. She reports that she does not drink alcohol and does not use drugs. Family History:  Family History  Problem Relation Age of Onset   Diabetes Mother    Heart disease Mother    Hypertension Mother    Stroke Mother    Hypertension Father  Stroke Father    Cancer Brother 31       Lung Cancer   Kidney disease Brother    Heart disease Maternal Aunt      HOME MEDICATIONS: Allergies as of 06/17/2022       Reactions   Atenolol Other (See Comments)   Atorvastatin Other (See Comments)   Canagliflozin Other (See Comments)   Liraglutide Other (See Comments)   Lisinopril Swelling   Lisinopril-hydrochlorothiazide Other (See Comments)   Losartan Potassium-hctz Other (See Comments)   Metformin Hcl Other (See Comments)   Sumatriptan Other (See Comments)   Very crazy dreams   Sulfa Antibiotics Rash        Medication List        Accurate as of June 17, 2022  2:57 PM. If you have any questions, ask your nurse or doctor.          Accu-Chek Guide Me w/Device  Kit Use as directed 3 times daily to test blood sugar E11.65   Accu-Chek Guide test strip Generic drug: glucose blood USE AS INSTRUCTED TO TEST  BLOOD SUGAR 3 TIMES DAILY   Accu-Chek Softclix Lancets lancets USE AS INSTRUCTED 3 TIMES  DAILY   BD Pen Needle Micro U/F 32G X 6 MM Misc Generic drug: Insulin Pen Needle 1 Device by Does not apply route in the morning, at noon, in the evening, and at bedtime.   Dexcom G6 Sensor Misc 1 Device by Does not apply route as directed.   Dexcom G6 Transmitter Misc 1 Device by Does not apply route as directed.   dicyclomine 20 MG tablet Commonly known as: BENTYL TAKE 1 TABLET BY MOUTH THREE TIMES DAILY AS NEEDED FOR CRAMPS WITH FOOD   ezetimibe 10 MG tablet Commonly known as: ZETIA Take 1 tablet (10 mg total) by mouth daily.   famotidine 10 MG tablet Commonly known as: PEPCID Take 10 mg by mouth as needed.   hydrochlorothiazide 25 MG tablet Commonly known as: HYDRODIURIL Take 25 mg by mouth every other day.   ibuprofen 200 MG tablet Commonly known as: ADVIL Take 200 mg by mouth every 6 (six) hours as needed for pain.   insulin lispro 100 UNIT/ML KwikPen Commonly known as: HumaLOG KwikPen Inject 12 Units into the skin 3 (three) times daily.   Mobic 15 MG tablet Generic drug: meloxicam Take 15 mg by mouth daily.   naproxen 375 MG tablet Commonly known as: NAPROSYN Take 375 mg by mouth 2 (two) times daily as needed.   ondansetron 4 MG disintegrating tablet Commonly known as: ZOFRAN-ODT Take 4 mg by mouth every 8 (eight) hours as needed.         OBJECTIVE:   Vital Signs: BP 136/78 (BP Location: Left Arm, Patient Position: Sitting, Cuff Size: Normal)   Pulse 93   Ht '5\' 4"'  (1.626 m)   Wt 219 lb 12.8 oz (99.7 kg)   SpO2 96%   BMI 37.73 kg/m   Wt Readings from Last 3 Encounters:  06/17/22 219 lb 12.8 oz (99.7 kg)  05/30/22 220 lb 12.8 oz (100.2 kg)  05/18/22 219 lb 12.8 oz (99.7 kg)     Exam: General: Pt appears  well and is in NAD  Lungs: Clear with good BS bilat with no rales, rhonchi, or wheezes  Heart: RRR   Extremities: No pretibial edema.   Neuro: MS is good with appropriate affect, pt is alert and Ox3    DM foot exam: 07/28/2021   The skin of the  feet is intact without sores or ulcerations. The pedal pulses are 2+ on right and 2+ on left. The sensation is absent at the heels to a screening 5.07, 10 gram monofilament bilaterally     DATA REVIEWED:  Lab Results  Component Value Date   HGBA1C 8.8 (A) 06/17/2022   HGBA1C 8.8 (A) 02/01/2022   HGBA1C 8.4 (A) 07/28/2021         Latest Reference Range & Units 03/28/22 15:28  Sodium 134 - 144 mmol/L 142  Potassium 3.5 - 5.2 mmol/L 4.3  Chloride 96 - 106 mmol/L 103  CO2 20 - 29 mmol/L 23  Glucose 70 - 99 mg/dL 168 (H)  BUN 8 - 27 mg/dL 8  Creatinine 0.57 - 1.00 mg/dL 0.71  Calcium 8.7 - 10.3 mg/dL 10.6 (H)  BUN/Creatinine Ratio 12 - 28  11 (L)  eGFR >59 mL/min/1.73 89    ASSESSMENT / PLAN / RECOMMENDATIONS:   1) Type 2 Diabetes Mellitus, Poorly controlled, With Neuropathic complications - Most recent A1c of 8.8 %. Goal A1c <7.0 %.     -Her A1c has remained stable but continues to be above goal -Her in office BG was 311 mg/DL, the patient had sloppy Joe's for lunch and drank a regular lemonade, we again discussed the importance of avoiding sugar sweetened beverages - She is intolerant to metformin, as well as invokana. Pt has reported hx of recurrent UTI's  -Intolerant to Rybelsus Rybelsus due to leg pains , and the second time lip swelling -I have attempted to switch her from insulin mix to multiple daily injections of insulin but this was not a successful experience for her -I have encouraged her to continue going to the [facility] for exercise -I will adjust her insulin as below -She was advised to use 40 units of Humalog mix with breakfast instead of 80 units on the days that she has exercise -She tends to adjust her supper dose of  insulin based on her glucose readings, I did explain to the patient that if her supper BG is normal, then reducing her supper dose of insulin will cause hypoglycemia in the morning   MEDICATIONS: Humalog Mix 80 units with breakfast, 60 units with supper   EDUCATION / INSTRUCTIONS: BG monitoring instructions: Patient is instructed to check her blood sugars 3 times a day, before meals. Call [facility] if: BG persistently < 70  I reviewed the Rule of 15 for the treatment of hypoglycemia in detail with the patient. Literature supplied.    2) Diabetic complications:   Eye: Does not have known diabetic retinopathy.  Neuro/ Feet: Does have known diabetic peripheral neuropathy based on her  Exam  Renal: Patient does not have known baseline CKD. She is not on an ACEI/ARB at present.C She is allergic to lisinopril      3) Dyslipidemia :  -Due to LDL above goal  at 119 mg/dL in 07/2021 .l switch pravastatin to atorvastatin but developed night mares and per patient atorvastatin was switched to Zetia by cardiology   - Will defer further management to cardiology     F/U in 4 months    Signed electronically by: Mack Guise, MD  [facility]  [facility] [address] [place], [place] [address] Phone: [phone] FAX: [phone]   CC: Donald Prose, [place] [address] Phone: [phone]  Fax: [phone]  Return to Endocrinology clinic as below: Future Appointments  Date Time Provider  Emanuel  06/17/2022  3:00 PM Zosia Lucchese, Melanie Crazier, MD [facility] None  08/03/2022  9:00 AM Skeet Latch, MD [facility]

## 2022-06-20 NOTE — Progress Notes (Signed)
[employer] PREP Weekly Session  Patient Details  Name: Jamie Burke MRN: [id number] Date of Birth: Apr 15, 1946 Age: 76 y.o. PCP: Donald Prose, MD  Vitals:   06/20/22 1300  Weight: 220 lb (99.8 kg)     [facility] Weekly seesion - 06/20/22 1500       [facility] "PREP" Location   [facility] "PREP" Location [facility]      Weekly Session   Topic Discussed Health habits    Minutes exercised this week 135 minutes    Classes attended to date [place] 06/20/2022, 3:53 PM

## 2022-06-28 NOTE — Telephone Encounter (Signed)
Error

## 2022-07-13 ENCOUNTER — Telehealth: Payer: Self-pay

## 2022-07-13 NOTE — Telephone Encounter (Signed)
Call from pt today.  Request call back discuss dropping PREP due to missed classes Called pt back. She feels she has missed to much to continue. Requests to retry at another date.  Advised next class available will likely be in Jan of 2024. Pt is okay with waiting.  Has attended 7 classes total (13 offered). Has attended only 3 of the 6 educational sessions.

## 2022-07-14 DIAGNOSIS — R051 Acute cough: Secondary | ICD-10-CM | POA: Diagnosis not present

## 2022-07-14 DIAGNOSIS — J029 Acute pharyngitis, unspecified: Secondary | ICD-10-CM | POA: Diagnosis not present

## 2022-07-14 DIAGNOSIS — U071 COVID-19: Secondary | ICD-10-CM | POA: Diagnosis not present

## 2022-08-03 ENCOUNTER — Ambulatory Visit (HOSPITAL_BASED_OUTPATIENT_CLINIC_OR_DEPARTMENT_OTHER): Payer: Medicare Other | Admitting: Cardiovascular Disease

## 2022-08-04 ENCOUNTER — Ambulatory Visit: Payer: Medicare Other | Admitting: Internal Medicine

## 2022-08-22 DIAGNOSIS — L729 Follicular cyst of the skin and subcutaneous tissue, unspecified: Secondary | ICD-10-CM | POA: Diagnosis not present

## 2022-08-23 ENCOUNTER — Telehealth: Payer: Self-pay

## 2022-08-23 NOTE — Telephone Encounter (Signed)
Patient will come and fill out [facility] application before it expires on 10/23/22.

## 2022-09-09 ENCOUNTER — Telehealth: Payer: Self-pay

## 2022-09-09 NOTE — Telephone Encounter (Signed)
Patient notified that [facility] application is approved

## 2022-09-23 ENCOUNTER — Encounter: Payer: Self-pay | Admitting: Podiatry

## 2022-09-23 ENCOUNTER — Ambulatory Visit: Payer: Medicare Other | Admitting: Podiatry

## 2022-09-23 VITALS — BP 180/75

## 2022-09-23 DIAGNOSIS — M6740 Ganglion, unspecified site: Secondary | ICD-10-CM | POA: Diagnosis not present

## 2022-09-23 NOTE — Progress Notes (Unsigned)
Subjective:  Patient ID: Jamie Burke, female    DOB: 13-Dec-1945,  MRN: [id number]  Chief Complaint  Patient presents with   Cyst    76 y.o. female presents with the above complaint.  Patient presents with continuous follow-up of right third digit mucoid cyst.  She states that it is came back again she would like to have it drained she does not want to surgically take it out.  She denies any other acute complaints.   Review of Systems: Negative except as noted in the HPI. Denies N/V/F/Ch.  Past Medical History:  Diagnosis Date   Diabetes mellitus    Dysplastic nevi    Gastroparesis    GERD (gastroesophageal reflux disease)    Hypertension    Melanoma ([place])    Snoring 03/28/2022   Tachycardia 03/28/2022    Current Outpatient Medications:    Accu-Chek Softclix Lancets lancets, USE AS INSTRUCTED 3 TIMES  DAILY, Disp: 200 each, Rfl: 4   Blood Glucose Monitoring Suppl (ACCU-CHEK GUIDE ME) w/Device KIT, Use as directed 3 times daily to test blood sugar E11.65, Disp: 1 kit, Rfl: 0   dicyclomine (BENTYL) 20 MG tablet, TAKE 1 TABLET BY MOUTH THREE TIMES DAILY AS NEEDED FOR CRAMPS WITH FOOD, Disp: , Rfl:    ezetimibe (ZETIA) 10 MG tablet, Take 1 tablet (10 mg total) by mouth daily., Disp: 90 tablet, Rfl: 3   famotidine (PEPCID) 10 MG tablet, Take 10 mg by mouth as needed., Disp: , Rfl:    glucose blood (ACCU-CHEK GUIDE) test strip, USE AS INSTRUCTED TO TEST  BLOOD SUGAR 3 TIMES DAILY, Disp: 300 strip, Rfl: 3   hydrochlorothiazide (HYDRODIURIL) 25 MG tablet, Take 25 mg by mouth every other day., Disp: , Rfl:    ibuprofen (ADVIL,MOTRIN) 200 MG tablet, Take 200 mg by mouth every 6 (six) hours as needed for pain., Disp: , Rfl:    Insulin Lispro Prot & Lispro (HUMALOG MIX 75/25 KWIKPEN) (75-25) 100 UNIT/ML Kwikpen, Inject 80 Units into the skin daily with breakfast AND 60 Units daily with supper., Disp: 135 mL, Rfl: 3   Insulin Pen Needle (BD PEN NEEDLE MICRO U/F) 32G X 6 MM MISC, 1 Device by  Does not apply route in the morning, at noon, in the evening, and at bedtime., Disp: 400 each, Rfl: 3   MOBIC 15 MG tablet, Take 15 mg by mouth daily., Disp: , Rfl:    naproxen (NAPROSYN) 375 MG tablet, Take 375 mg by mouth 2 (two) times daily as needed., Disp: , Rfl:    ondansetron (ZOFRAN-ODT) 4 MG disintegrating tablet, Take 4 mg by mouth every 8 (eight) hours as needed., Disp: , Rfl:   Social History   Tobacco Use  Smoking Status Never  Smokeless Tobacco Never    Allergies  Allergen Reactions   Atenolol Other (See Comments)   Atorvastatin Other (See Comments)   Canagliflozin Other (See Comments)   Liraglutide Other (See Comments)   Lisinopril Swelling   Lisinopril-Hydrochlorothiazide Other (See Comments)   Losartan Potassium-Hctz Other (See Comments)   Metformin Hcl Other (See Comments)   Sumatriptan Other (See Comments)    Very crazy dreams    Sulfa Antibiotics Rash   Objective:   Vitals:   09/23/22 0945  BP: (!) 180/75   There is no height or weight on file to calculate BMI. Constitutional Well developed. Well nourished.  Vascular Dorsalis pedis pulses palpable bilaterally. Posterior tibial pulses palpable bilaterally. Capillary refill normal to all digits.  No cyanosis or  clubbing noted. Pedal hair growth normal.  Neurologic Normal speech. Oriented to person, place, and time. Epicritic sensation to light touch grossly present bilaterally.  Dermatologic  right dorsal third digit mucoid cyst/ganglion cyst.  Positive transilluminates.  It appears to be more indurated than fluctuance.  It is single lobulated.  Does not appear to be involving/penetrating deeper than the dermis tissue.  Orthopedic: Normal joint ROM without pain or crepitus bilaterally. No visible deformities. No bony tenderness.   Radiographs: None Assessment:   No diagnosis found.  Plan:  Patient was evaluated and treated and all questions answered.  Right third digit mucoid cyst/ganglion  cyst -I explained to the patient the etiology of the cyst and various treatment options were discussed.  Given that this is a recurrence I believe patient will benefit from a surgical excision of the cyst for more definitive procedure.  However patient would like to hold off on a definitive procedure for now.  She would like to attempt aspiration drainage 1 more time prior to undergoing definitive procedure and if it comes back then she will definitely have it excised out.  I discussed with the patient in extensive detail for plans of aspiration and drainage of cyst.  Patient states understanding and would like to proceed with the procedure -Betadine was used to cleanse the third digit right foot.  One-to-one mixture 1% lidocaine plain half percent Marcaine plain was utilized to numb up the toe.  Once adequate anesthesia was obtained, an 18-gauge needle was used to penetrate the epidermis and dermis 10 tissue into the cyst.  At this time gelatinous material was drained consistent with a ganglion cyst as opposed to mucoid cyst.  All of the material was drained completely in its entirety.  No complications noted.  The foot was wrapped with 4 x 4 triple antibiotic and Coban.    No follow-ups on file.

## 2022-09-29 DIAGNOSIS — L918 Other hypertrophic disorders of the skin: Secondary | ICD-10-CM | POA: Diagnosis not present

## 2022-09-29 DIAGNOSIS — M7130 Other bursal cyst, unspecified site: Secondary | ICD-10-CM | POA: Diagnosis not present

## 2022-09-29 DIAGNOSIS — L821 Other seborrheic keratosis: Secondary | ICD-10-CM | POA: Diagnosis not present

## 2022-10-04 DIAGNOSIS — I10 Essential (primary) hypertension: Secondary | ICD-10-CM | POA: Diagnosis not present

## 2022-10-07 DIAGNOSIS — E119 Type 2 diabetes mellitus without complications: Secondary | ICD-10-CM | POA: Diagnosis not present

## 2022-10-07 DIAGNOSIS — H524 Presbyopia: Secondary | ICD-10-CM | POA: Diagnosis not present

## 2022-10-07 DIAGNOSIS — H4321 Crystalline deposits in vitreous body, right eye: Secondary | ICD-10-CM | POA: Diagnosis not present

## 2022-10-07 DIAGNOSIS — H5213 Myopia, bilateral: Secondary | ICD-10-CM | POA: Diagnosis not present

## 2022-10-07 DIAGNOSIS — H2512 Age-related nuclear cataract, left eye: Secondary | ICD-10-CM | POA: Diagnosis not present

## 2022-10-07 DIAGNOSIS — H25012 Cortical age-related cataract, left eye: Secondary | ICD-10-CM | POA: Diagnosis not present

## 2022-10-07 LAB — HM DIABETES EYE EXAM

## 2022-10-10 ENCOUNTER — Encounter: Payer: Self-pay | Admitting: Internal Medicine

## 2022-10-21 ENCOUNTER — Telehealth (HOSPITAL_BASED_OUTPATIENT_CLINIC_OR_DEPARTMENT_OTHER): Payer: Self-pay

## 2022-10-21 DIAGNOSIS — E785 Hyperlipidemia, unspecified: Secondary | ICD-10-CM

## 2022-10-21 DIAGNOSIS — I10 Essential (primary) hypertension: Secondary | ICD-10-CM

## 2022-10-21 NOTE — Telephone Encounter (Signed)
Pt. Called office today and would like a referral for the [place] [facility] downstairs. Orders placed.

## 2022-11-11 ENCOUNTER — Ambulatory Visit: Payer: Medicare Other | Admitting: Internal Medicine

## 2022-11-11 ENCOUNTER — Encounter: Payer: Self-pay | Admitting: Internal Medicine

## 2022-11-11 VITALS — BP 130/82 | HR 95 | Ht 64.0 in | Wt 222.0 lb

## 2022-11-11 DIAGNOSIS — E1142 Type 2 diabetes mellitus with diabetic polyneuropathy: Secondary | ICD-10-CM | POA: Diagnosis not present

## 2022-11-11 DIAGNOSIS — E1165 Type 2 diabetes mellitus with hyperglycemia: Secondary | ICD-10-CM

## 2022-11-11 DIAGNOSIS — Z794 Long term (current) use of insulin: Secondary | ICD-10-CM | POA: Diagnosis not present

## 2022-11-11 LAB — POCT GLYCOSYLATED HEMOGLOBIN (HGB A1C): Hemoglobin A1C: 8.9 % — AB (ref 4.0–5.6)

## 2022-11-11 MED ORDER — TRULICITY 0.75 MG/0.5ML ~~LOC~~ SOAJ
0.7500 mg | SUBCUTANEOUS | 3 refills | Status: DC
Start: 2022-11-11 — End: 2023-04-17

## 2022-11-11 MED ORDER — INSULIN PEN NEEDLE 32G X 4 MM MISC: 1.0000 | Freq: Two times a day (BID) | 3 refills | Status: DC

## 2022-11-11 MED ORDER — INSULIN LISPRO PROT & LISPRO (75-25 MIX) 100 UNIT/ML KWIKPEN: 60.0000 [IU] | PEN_INJECTOR | Freq: Two times a day (BID) | SUBCUTANEOUS | 3 refills | Status: DC

## 2022-11-11 NOTE — Patient Instructions (Addendum)
-  Decrease Humalog Mix  56 units with Breakfast and 56 units with Supper - Start Trulicity 7.49 mg once weekly    HOW TO TREAT LOW BLOOD SUGARS (Blood sugar LESS THAN 70 MG/DL) Please follow the RULE OF 15 for the treatment of hypoglycemia treatment (when your (blood sugars are less than 70 mg/dL)   STEP 1: Take 15 grams of carbohydrates when your blood sugar is low, which includes:  3-4 GLUCOSE TABS  OR 3-4 OZ OF JUICE OR REGULAR SODA OR ONE TUBE OF GLUCOSE GEL    STEP 2: RECHECK blood sugar in 15 MINUTES STEP 3: If your blood sugar is still low at the 15 minute recheck --> then, go back to STEP 1 and treat AGAIN with another 15 grams of carbohydrates.

## 2022-11-11 NOTE — Progress Notes (Signed)
Name: Jamie Burke  Age/ Sex: 77 y.o., female   MRN/ DOB: [id number], 1946/06/13     PCP: Donald Prose, MD   Reason for Endocrinology Evaluation: Type 2 Diabetes Mellitus  Initial Endocrine Consultative Visit: 12/25/2019    PATIENT IDENTIFIER: Jamie Burke is a 77 y.o. female with a past medical history of T2DM and HTN. The patient has followed with Endocrinology clinic since 12/25/2019 for consultative assistance with management of her diabetes.  DIABETIC HISTORY:  Jamie Burke was diagnosed with DM > 20 yr ago. She has been on various  glycemic agents (metformin- weakness in the legs and incontinence, invokana - weakness in the legs. Victoza , Trulicity , she does not recall any side effects ). Her hemoglobin A1c has ranged from 8.6% in 2012, peaking at 8.9% in 2021.  On her initial visit to our clinic she had an A1c of 9.2%, she was on insulin mix and had metformin but she was not taking it due to GI effects.   She had tried Rybelsus twice the first time she stopped it in October 2022 due to leg pains, the second time she stopped it in May 2023 due to lip swelling   By April 2023 and due to persistent hyperglycemia we will switch Humalog mix to basal/prandial, by May 2023 she called back stating lip swelling with Toujeo and Rybelsus and persistent hyperglycemia.  She returned to using insulin mix and stopped Rybelsus  SUBJECTIVE:     During the last visit (06/17/2022): A1c 8.8 %.      Today (11/11/2022): Jamie Burke is here for a follow up on diabetes management.  She checks her blood sugars 2 times daily. She did have a hypoglycemic episode , that she attributes to exercise  She does snack all day long  She has not been to the [facility] in 2 weeks    Denies nausea, vomiting or diahhea but has constipation- takes stool softer every other day   HOME DIABETES REGIMEN:  Humalog Mix 80 units with Breakfast and 60 units with Supper - she takes 60 units BID     METER DOWNLOAD  SUMMARY: unable to download 76 - 248     DIABETIC COMPLICATIONS: Microvascular complications:   Denies: CKD, retinopathy , neuropathy  Last eye exam: 09/2022 Dr. Satira Sark    Macrovascular complications:    Denies: CAD, PVD, CVA     HISTORY:  Past Medical History:  Past Medical History:  Diagnosis Date   Diabetes mellitus    Dysplastic nevi    Gastroparesis    GERD (gastroesophageal reflux disease)    Hypertension    Melanoma ([place])    Snoring 03/28/2022   Tachycardia 03/28/2022   Past Surgical History:  Past Surgical History:  Procedure Laterality Date   ABDOMINAL HYSTERECTOMY     CARPAL TUNNEL RELEASE     BL   TRIGGER FINGER RELEASE     Social History:  reports that she has never smoked. She has never used smokeless tobacco. She reports that she does not drink alcohol and does not use drugs. Family History:  Family History  Problem Relation Age of Onset   Diabetes Mother    Heart disease Mother    Hypertension Mother    Stroke Mother    Hypertension Father    Stroke Father    Cancer Brother 26       Lung Cancer   Kidney disease Brother    Heart disease Maternal Aunt  HOME MEDICATIONS: Allergies as of 11/11/2022       Reactions   Atenolol Other (See Comments)   Atorvastatin Other (See Comments)   Canagliflozin Other (See Comments)   Liraglutide Other (See Comments)   Lisinopril Swelling   Lisinopril-hydrochlorothiazide Other (See Comments)   Losartan Potassium-hctz Other (See Comments)   Metformin Hcl Other (See Comments)   Sumatriptan Other (See Comments)   Very crazy dreams   Sulfa Antibiotics Rash        Medication List        Accurate as of November 11, 2022  9:37 AM. If you have any questions, ask your nurse or doctor.          STOP taking these medications    ibuprofen 200 MG tablet Commonly known as: ADVIL Stopped by: Dorita Sciara, MD   Mobic 15 MG tablet Generic drug: meloxicam Stopped by: Dorita Sciara,  MD       TAKE these medications    Accu-Chek Guide Me w/Device Kit Use as directed 3 times daily to test blood sugar E11.65   Accu-Chek Guide test strip Generic drug: glucose blood USE AS INSTRUCTED TO TEST  BLOOD SUGAR 3 TIMES DAILY   Accu-Chek Softclix Lancets lancets USE AS INSTRUCTED 3 TIMES  DAILY   BD Pen Needle Micro U/F 32G X 6 MM Misc Generic drug: Insulin Pen Needle 1 Device by Does not apply route in the morning, at noon, in the evening, and at bedtime.   dicyclomine 20 MG tablet Commonly known as: BENTYL TAKE 1 TABLET BY MOUTH THREE TIMES DAILY AS NEEDED FOR CRAMPS WITH FOOD   ezetimibe 10 MG tablet Commonly known as: ZETIA Take 1 tablet (10 mg total) by mouth daily.   famotidine 10 MG tablet Commonly known as: PEPCID Take 10 mg by mouth as needed.   hydrochlorothiazide 25 MG tablet Commonly known as: HYDRODIURIL Take 25 mg by mouth every other day.   Insulin Lispro Prot & Lispro (75-25) 100 UNIT/ML Kwikpen Commonly known as: HumaLOG Mix 75/25 KwikPen Inject 80 Units into the skin daily with breakfast AND 60 Units daily with supper.   naproxen 375 MG tablet Commonly known as: NAPROSYN Take 375 mg by mouth 2 (two) times daily as needed.   ondansetron 4 MG disintegrating tablet Commonly known as: ZOFRAN-ODT Take 4 mg by mouth every 8 (eight) hours as needed.         OBJECTIVE:   Vital Signs: BP 130/82 (BP Location: Left Arm, Patient Position: Sitting, Cuff Size: Large)   Pulse 95   Ht '5\' 4"'$  (1.626 m)   Wt 222 lb (100.7 kg)   SpO2 98%   BMI 38.11 kg/m   Wt Readings from Last 3 Encounters:  11/11/22 222 lb (100.7 kg)  06/20/22 220 lb (99.8 kg)  06/17/22 219 lb 12.8 oz (99.7 kg)     Exam: General: Pt appears well and is in NAD  Lungs: Clear with good BS bilat   Heart: RRR   Extremities: No pretibial edema.   Neuro: MS is good with appropriate affect, pt is alert and Ox3    DM foot exam: 11/11/2022   The skin of the feet is intact  without sores or ulcerations. The pedal pulses are 2+ on right and 2+ on left. The sensation is decreased at the heels to a screening 5.07, 10 gram monofilament bilaterally     DATA REVIEWED:  Lab Results  Component Value Date   HGBA1C 8.9 (A) 11/11/2022  HGBA1C 8.8 (A) 06/17/2022   HGBA1C 8.8 (A) 02/01/2022         Latest Reference Range & Units 03/28/22 15:28  Sodium 134 - 144 mmol/L 142  Potassium 3.5 - 5.2 mmol/L 4.3  Chloride 96 - 106 mmol/L 103  CO2 20 - 29 mmol/L 23  Glucose 70 - 99 mg/dL 168 (H)  BUN 8 - 27 mg/dL 8  Creatinine 0.57 - 1.00 mg/dL 0.71  Calcium 8.7 - 10.3 mg/dL 10.6 (H)  BUN/Creatinine Ratio 12 - 28  11 (L)  eGFR >59 mL/min/1.73 89    ASSESSMENT / PLAN / RECOMMENDATIONS:   1) Type 2 Diabetes Mellitus, Poorly controlled, With Neuropathic complications - Most recent A1c of 8.9 %. Goal A1c <7.0 %.     -Her A1c has remained stable but continues to be above goal - She is intolerant to metformin, as well as invokana. Pt has reported hx of recurrent UTI's  -Intolerant to Rybelsus Rybelsus due to leg pains , and the second time lip swelling -I have attempted to switch her from insulin mix to multiple daily injections of insulin but this was not a successful experience for her - Will try Trulicity at a small dose  - She was advised to reduce insulin while on Trulicity    MEDICATIONS: Decrease Humalog Mix 56 units with breakfast, 56 units with supper Start Trulicity 5.28 mg weekly    EDUCATION / INSTRUCTIONS: BG monitoring instructions: Patient is instructed to check her blood sugars 3 times a day, before meals. Call [facility] [facility] if: BG persistently < 70  I reviewed the Rule of 15 for the treatment of hypoglycemia in detail with the patient. Literature supplied.    2) Diabetic complications:   Eye: Does not have known diabetic retinopathy.  Neuro/ Feet: Does have known diabetic peripheral neuropathy based on her  Exam  Renal:  Patient does not have known baseline CKD. She is not on an ACEI/ARB at present.C She is allergic to lisinopril       F/U in 4 months    Signed electronically by: Mack Guise, MD  [facility]  [facility] [address] [place], [place] [address] Phone: [phone] FAX: [phone]   CC: Donald Prose, [address] [place] [address] Phone: [phone]  Fax: [phone]  Return to Endocrinology clinic as below: No future appointments.

## 2022-12-01 ENCOUNTER — Telehealth: Payer: Self-pay

## 2022-12-01 ENCOUNTER — Other Ambulatory Visit: Payer: Self-pay

## 2022-12-01 MED ORDER — ACCU-CHEK GUIDE ME W/DEVICE KIT: PACK | 0 refills | Status: DC

## 2022-12-01 MED ORDER — ACCU-CHEK SOFTCLIX LANCETS MISC: 4 refills | Status: DC

## 2022-12-01 MED ORDER — ACCU-CHEK GUIDE VI STRP: ORAL_STRIP | 3 refills | Status: DC

## 2022-12-01 NOTE — Telephone Encounter (Signed)
Patient no longer using rybelsus

## 2022-12-16 NOTE — Telephone Encounter (Signed)
duplicate

## 2022-12-18 ENCOUNTER — Other Ambulatory Visit: Payer: Self-pay | Admitting: Internal Medicine

## 2022-12-19 ENCOUNTER — Telehealth: Payer: Self-pay

## 2022-12-19 NOTE — Telephone Encounter (Signed)
Patient states that she is unable to take the Trulicity because she is experiencing  abdominal pain daily.

## 2022-12-20 NOTE — Telephone Encounter (Signed)
Patient advised and will stop Trulicity

## 2023-01-31 DIAGNOSIS — J019 Acute sinusitis, unspecified: Secondary | ICD-10-CM | POA: Diagnosis not present

## 2023-02-14 DIAGNOSIS — M7061 Trochanteric bursitis, right hip: Secondary | ICD-10-CM | POA: Diagnosis not present

## 2023-02-14 DIAGNOSIS — M5441 Lumbago with sciatica, right side: Secondary | ICD-10-CM | POA: Diagnosis not present

## 2023-03-28 DIAGNOSIS — H25012 Cortical age-related cataract, left eye: Secondary | ICD-10-CM | POA: Diagnosis not present

## 2023-03-28 DIAGNOSIS — E113292 Type 2 diabetes mellitus with mild nonproliferative diabetic retinopathy without macular edema, left eye: Secondary | ICD-10-CM | POA: Diagnosis not present

## 2023-03-28 DIAGNOSIS — H5310 Unspecified subjective visual disturbances: Secondary | ICD-10-CM | POA: Diagnosis not present

## 2023-03-30 DIAGNOSIS — R112 Nausea with vomiting, unspecified: Secondary | ICD-10-CM | POA: Diagnosis not present

## 2023-03-30 DIAGNOSIS — J069 Acute upper respiratory infection, unspecified: Secondary | ICD-10-CM | POA: Diagnosis not present

## 2023-03-30 DIAGNOSIS — R519 Headache, unspecified: Secondary | ICD-10-CM | POA: Diagnosis not present

## 2023-04-03 ENCOUNTER — Encounter (INDEPENDENT_AMBULATORY_CARE_PROVIDER_SITE_OTHER): Payer: Medicare Other | Admitting: Family Medicine

## 2023-04-04 ENCOUNTER — Telehealth: Payer: Self-pay | Admitting: Internal Medicine

## 2023-04-06 ENCOUNTER — Ambulatory Visit: Payer: Medicare Other | Admitting: Internal Medicine

## 2023-04-14 NOTE — Telephone Encounter (Signed)
NA

## 2023-04-17 ENCOUNTER — Ambulatory Visit: Payer: Medicare Other | Admitting: Internal Medicine

## 2023-04-17 ENCOUNTER — Encounter: Payer: Self-pay | Admitting: Internal Medicine

## 2023-04-17 VITALS — BP 126/80 | HR 89 | Ht 64.0 in | Wt 225.0 lb

## 2023-04-17 DIAGNOSIS — Z794 Long term (current) use of insulin: Secondary | ICD-10-CM

## 2023-04-17 DIAGNOSIS — E1165 Type 2 diabetes mellitus with hyperglycemia: Secondary | ICD-10-CM

## 2023-04-17 LAB — POCT GLYCOSYLATED HEMOGLOBIN (HGB A1C): Hemoglobin A1C: 8.6 % — AB (ref 4.0–5.6)

## 2023-04-17 MED ORDER — INSULIN LISPRO PROT & LISPRO (75-25 MIX) 100 UNIT/ML KWIKPEN
PEN_INJECTOR | SUBCUTANEOUS | 3 refills | Status: DC
Start: 2023-04-17 — End: 2023-10-19

## 2023-04-17 MED ORDER — INSULIN PEN NEEDLE 32G X 4 MM MISC: 1.0000 | Freq: Two times a day (BID) | 3 refills | Status: DC

## 2023-04-17 NOTE — Patient Instructions (Signed)
-   Change  Humalog Mix  64 units with Breakfast and 60 units with Supper   HOW TO TREAT LOW BLOOD SUGARS (Blood sugar LESS THAN 70 MG/DL) Please follow the RULE OF 15 for the treatment of hypoglycemia treatment (when your (blood sugars are less than 70 mg/dL)   STEP 1: Take 15 grams of carbohydrates when your blood sugar is low, which includes:  3-4 GLUCOSE TABS  OR 3-4 OZ OF JUICE OR REGULAR SODA OR ONE TUBE OF GLUCOSE GEL    STEP 2: RECHECK blood sugar in 15 MINUTES STEP 3: If your blood sugar is still low at the 15 minute recheck --> then, go back to STEP 1 and treat AGAIN with another 15 grams of carbohydrates.

## 2023-04-17 NOTE — Progress Notes (Signed)
Name: Jamie Burke  Age/ Sex: 77 y.o., female   MRN/ DOB: [id number], [birth year]     PCP: Deatra James, MD   Reason for Endocrinology Evaluation: Type 2 Diabetes Mellitus  Initial Endocrine Consultative Visit: 12/25/2019    PATIENT IDENTIFIER: Jamie Burke is a 77 y.o. female with a past medical history of T2DM and HTN. The patient has followed with Endocrinology clinic since 12/25/2019 for consultative assistance with management of her diabetes.  DIABETIC HISTORY:  [name] was diagnosed with DM > 20 yr ago. She has been on various  glycemic agents (metformin- weakness in the legs and incontinence, invokana - weakness in the legs. Victoza , Trulicity , she does not recall any side effects ). Her hemoglobin A1c has ranged from 8.6% in 2012, peaking at 8.9% in 2021.  On her initial visit to our clinic she had an A1c of 9.2%, she was on insulin mix and had metformin but she was not taking it due to GI effects.   She had tried Rybelsus twice the first time she stopped it in October 2022 due to leg pains, the second time she stopped it in May 2023 due to lip swelling   By April 2023 and due to persistent hyperglycemia we will switch Humalog mix to basal/prandial, by May 2023 she called back stating lip swelling with Toujeo and Rybelsus and persistent hyperglycemia.  She returned to using insulin mix and stopped Rybelsus   She did not tolerate Trulicity due to abdominal pain 01/2023  SUBJECTIVE:   During the last visit (06/17/2022): A1c 8.8 %.      Today (04/17/2023): [name] is here for a follow up on diabetes management.  She checks her blood sugars 2 times daily. She  does not have hypoglycemic episodes    Denies nausea or vomiting  Continues with chronic constipation  She had left eye vision change, was seen by ophthalmology  Has recent URI's     HOME DIABETES REGIMEN:  Humalog Mix 60 units with Breakfast and 60 units with Supper     METER DOWNLOAD SUMMARY: unable  to download 138- 246 mg/dL     DIABETIC COMPLICATIONS: Microvascular complications:   Denies: CKD, retinopathy , neuropathy  Last eye exam: 02/2023   Macrovascular complications:    Denies: CAD, PVD, CVA     HISTORY:  Past Medical History:  Past Medical History:  Diagnosis Date   Diabetes mellitus    Dysplastic nevi    Gastroparesis    GERD (gastroesophageal reflux disease)    Hypertension    Melanoma ([facility])    Snoring 03/28/2022   Tachycardia 03/28/2022   Past Surgical History:  Past Surgical History:  Procedure Laterality Date   ABDOMINAL HYSTERECTOMY     CARPAL TUNNEL RELEASE     BL   TRIGGER FINGER RELEASE     Social History:  reports that she has never smoked. She has never used smokeless tobacco. She reports that she does not drink alcohol and does not use drugs. Family History:  Family History  Problem Relation Age of Onset   Diabetes Mother    Heart disease Mother    Hypertension Mother    Stroke Mother    Hypertension Father    Stroke Father    Cancer Brother 54       Lung Cancer   Kidney disease Brother    Heart disease Maternal Aunt      HOME MEDICATIONS: Allergies as of 04/17/2023  Reactions   Atenolol Other (See Comments)   Atorvastatin Other (See Comments)   Canagliflozin Other (See Comments)   Liraglutide Other (See Comments)   Lisinopril Swelling   Lisinopril-hydrochlorothiazide Other (See Comments)   Losartan Potassium-hctz Other (See Comments)   Metformin Hcl Other (See Comments)   Sumatriptan Other (See Comments)   Very crazy dreams   Sulfa Antibiotics Rash        Medication List        Accurate as of April 17, 2023 12:58 PM. If you have any questions, ask your nurse or doctor.          Accu-Chek Guide Me w/Device Kit Use as directed 3 times daily to test blood sugar E11.65   Accu-Chek Guide test strip Generic drug: glucose blood USE AS INSTRUCTED TO TEST BLOOD  SUGAR 3 TIMES DAILY   Accu-Chek Softclix Lancets  lancets USE AS INSTRUCTED 3 TIMES  DAILY   dicyclomine 20 MG tablet Commonly known as: BENTYL TAKE 1 TABLET BY MOUTH THREE TIMES DAILY AS NEEDED FOR CRAMPS WITH FOOD   ezetimibe 10 MG tablet Commonly known as: ZETIA Take 1 tablet (10 mg total) by mouth daily.   famotidine 10 MG tablet Commonly known as: PEPCID Take 10 mg by mouth as needed.   hydrochlorothiazide 25 MG tablet Commonly known as: HYDRODIURIL Take 25 mg by mouth every other day.   Insulin Lispro Prot & Lispro (75-25) 100 UNIT/ML Kwikpen Commonly known as: HumaLOG Mix 75/25 KwikPen Inject 60 Units into the skin in the morning and at bedtime.   Insulin Pen Needle 32G X 4 MM Misc 1 Device by Does not apply route in the morning and at bedtime.   naproxen 375 MG tablet Commonly known as: NAPROSYN Take 375 mg by mouth 2 (two) times daily as needed.   ondansetron 4 MG disintegrating tablet Commonly known as: ZOFRAN-ODT Take 4 mg by mouth every 8 (eight) hours as needed.   Trulicity 0.75 MG/0.5ML Sopn Generic drug: Dulaglutide Inject 0.75 mg into the skin once a week.         OBJECTIVE:   Vital Signs: BP 126/80 (BP Location: Left Arm, Patient Position: Sitting, Cuff Size: Large)   Pulse 89   Ht 5\' 4"  (1.626 m)   Wt 225 lb (102.1 kg)   SpO2 99%   BMI 38.62 kg/m   Wt Readings from Last 3 Encounters:  04/17/23 225 lb (102.1 kg)  11/11/22 222 lb (100.7 kg)  06/20/22 220 lb (99.8 kg)     Exam: General: Pt appears well and is in NAD  Lungs: Clear with good BS bilat   Heart: RRR   Extremities: No pretibial edema.   Neuro: MS is good with appropriate affect, pt is alert and Ox3    DM foot exam: 11/11/2022   The skin of the feet is intact without sores or ulcerations. The pedal pulses are 2+ on right and 2+ on left. The sensation is decreased at the heels to a screening 5.07, 10 gram monofilament bilaterally     DATA REVIEWED:  Lab Results  Component Value Date   HGBA1C 8.6 (A) 04/17/2023    HGBA1C 8.9 (A) 11/11/2022   HGBA1C 8.8 (A) 06/17/2022         10/04/2022 BUN 10 Cr. 0.930 GFR 64  ASSESSMENT / PLAN / RECOMMENDATIONS:   1) Type 2 Diabetes Mellitus, Poorly controlled, With Neuropathic complications - Most recent A1c of 8.6 %. Goal A1c <7.0 %.     -Her  A1c remains above goal  - She is intolerant to metformin, as well as invokana -Intolerant to Rybelsus Rybelsus due to leg pains , and the second time lip swelling -I have attempted to switch her from insulin mix to multiple daily injections of insulin but this was not a successful experience for her -She did not tolerate Trulicity due to abdominal pain - Will increase insulin as below  - Encouraged low carb snacking if necessary and exercise   MEDICATIONS: Change Humalog Mix 64 units with breakfast, 60 units with supper   EDUCATION / INSTRUCTIONS: BG monitoring instructions: Patient is instructed to check her blood sugars 3 times a day, before meals. Call [facility] if: BG persistently < 70  I reviewed the Rule of 15 for the treatment of hypoglycemia in detail with the patient. Literature supplied.    2) Diabetic complications:   Eye: Does not have known diabetic retinopathy.  Neuro/ Feet: Does have known diabetic peripheral neuropathy based on her  Exam  Renal: Patient does not have known baseline CKD. She is not on an ACEI/ARB at present.C She is allergic to lisinopril       F/U in 3  months    Signed electronically by: Lyndle Herrlich, MD  [facility]  [facility] [address] [place], [place] [address] Phone: [phone] FAX: [phone]   CC: Deatra James, MD [address] [place] [address] Phone: [phone]  Fax: [phone]  Return to Endocrinology clinic as below: Future Appointments  Date Time Provider Department Center  04/17/2023  1:00 PM Gaby Harney, Konrad Dolores, MD [facility] None   05/03/2023  5:30 PM Langston Reusing, MD [facility] None

## 2023-04-28 DIAGNOSIS — G720 Drug-induced myopathy: Secondary | ICD-10-CM | POA: Diagnosis not present

## 2023-04-28 DIAGNOSIS — R0981 Nasal congestion: Secondary | ICD-10-CM | POA: Diagnosis not present

## 2023-04-28 DIAGNOSIS — E1140 Type 2 diabetes mellitus with diabetic neuropathy, unspecified: Secondary | ICD-10-CM | POA: Diagnosis not present

## 2023-04-28 DIAGNOSIS — E1165 Type 2 diabetes mellitus with hyperglycemia: Secondary | ICD-10-CM | POA: Diagnosis not present

## 2023-04-28 DIAGNOSIS — Z794 Long term (current) use of insulin: Secondary | ICD-10-CM | POA: Diagnosis not present

## 2023-04-28 DIAGNOSIS — E7800 Pure hypercholesterolemia, unspecified: Secondary | ICD-10-CM | POA: Diagnosis not present

## 2023-05-03 ENCOUNTER — Encounter (INDEPENDENT_AMBULATORY_CARE_PROVIDER_SITE_OTHER): Payer: Medicare Other | Admitting: Family Medicine

## 2023-05-04 ENCOUNTER — Telehealth: Payer: Self-pay

## 2023-05-04 NOTE — Telephone Encounter (Signed)
LMTCB to give information below.

## 2023-05-04 NOTE — Telephone Encounter (Signed)
Patient states that she started again this pass Sunday and that she is having stomach pain but it not unbearable and she is having some constipation but has been taking Miralax.  Patient has been doing 50 units BID of Humalog since her sugars have been in the 100 range. Patient that at her last visit she discuss starting the Trulicity back up again.

## 2023-05-04 NOTE — Telephone Encounter (Signed)
Patient advised on medication adjustment and verbalized understanding.  

## 2023-05-04 NOTE — Telephone Encounter (Signed)
Patient states that she has started the Trulicity and he sugars have been coming down. Would like to know if she needs to adjust her Humalog mix since she is on the Trulicity.

## 2023-05-18 ENCOUNTER — Emergency Department (HOSPITAL_COMMUNITY)
Admission: EM | Admit: 2023-05-18 | Discharge: 2023-05-19 | Disposition: A | Discharge status: Home / Self Care | Payer: Medicare Other | Attending: Student | Admitting: Student

## 2023-05-18 ENCOUNTER — Encounter (HOSPITAL_COMMUNITY): Payer: Self-pay

## 2023-05-18 ENCOUNTER — Other Ambulatory Visit: Payer: Self-pay

## 2023-05-18 DIAGNOSIS — I6782 Cerebral ischemia: Secondary | ICD-10-CM | POA: Diagnosis not present

## 2023-05-18 DIAGNOSIS — R8289 Other abnormal findings on cytological and histological examination of urine: Secondary | ICD-10-CM | POA: Diagnosis not present

## 2023-05-18 DIAGNOSIS — Z79899 Other long term (current) drug therapy: Secondary | ICD-10-CM | POA: Diagnosis not present

## 2023-05-18 DIAGNOSIS — Z794 Long term (current) use of insulin: Secondary | ICD-10-CM | POA: Insufficient documentation

## 2023-05-18 DIAGNOSIS — G43909 Migraine, unspecified, not intractable, without status migrainosus: Secondary | ICD-10-CM | POA: Insufficient documentation

## 2023-05-18 DIAGNOSIS — I10 Essential (primary) hypertension: Secondary | ICD-10-CM | POA: Insufficient documentation

## 2023-05-18 DIAGNOSIS — E119 Type 2 diabetes mellitus without complications: Secondary | ICD-10-CM | POA: Insufficient documentation

## 2023-05-18 DIAGNOSIS — R531 Weakness: Secondary | ICD-10-CM | POA: Diagnosis not present

## 2023-05-18 DIAGNOSIS — R519 Headache, unspecified: Secondary | ICD-10-CM | POA: Diagnosis present

## 2023-05-18 LAB — URINALYSIS, ROUTINE W REFLEX MICROSCOPIC
Bilirubin Urine: NEGATIVE
Glucose, UA: NEGATIVE mg/dL
Hgb urine dipstick: NEGATIVE
Ketones, ur: NEGATIVE mg/dL
Nitrite: NEGATIVE
Protein, ur: NEGATIVE mg/dL
Specific Gravity, Urine: 1.004 — ABNORMAL LOW (ref 1.005–1.030)
WBC, UA: >50 WBC/hpf (ref 0–5)
pH: 6 (ref 5.0–8.0)

## 2023-05-18 LAB — BASIC METABOLIC PANEL
Anion gap: 9 (ref 5–15)
BUN: 8 mg/dL (ref 8–23)
CO2: 25 mmol/L (ref 22–32)
Calcium: 9.8 mg/dL (ref 8.9–10.3)
Chloride: 103 mmol/L (ref 98–111)
Creatinine, Ser: 0.75 mg/dL (ref 0.44–1.00)
GFR, Estimated: >60 mL/min (ref >60)
Glucose, Bld: 125 mg/dL — ABNORMAL HIGH (ref 70–99)
Potassium: 3.7 mmol/L (ref 3.5–5.1)
Sodium: 137 mmol/L (ref 135–145)

## 2023-05-18 LAB — CBC
HCT: 39.4 % (ref 36.0–46.0)
Hemoglobin: 12.1 g/dL (ref 12.0–15.0)
MCH: 25.8 pg — ABNORMAL LOW (ref 26.0–34.0)
MCHC: 30.7 g/dL (ref 30.0–36.0)
MCV: 84 fL (ref 80.0–100.0)
Platelets: 297 10*3/uL (ref 150–400)
RBC: 4.69 MIL/uL (ref 3.87–5.11)
RDW: 14.4 % (ref 11.5–15.5)
WBC: 10 10*3/uL (ref 4.0–10.5)
nRBC: 0 % (ref 0.0–0.2)

## 2023-05-18 LAB — CBG MONITORING, ED: Glucose-Capillary: 115 mg/dL — ABNORMAL HIGH (ref 70–99)

## 2023-05-18 MED ORDER — ACETAMINOPHEN 500 MG PO TABS
1000.0000 mg | ORAL_TABLET | Freq: Once | ORAL | Status: AC
  Administered 2023-05-18: 1000 mg via ORAL
  Filled 2023-05-18: qty 2

## 2023-05-18 NOTE — ED Triage Notes (Signed)
Says around 6PM tonight she began having left sided facial pain and was unable to vocalize her thoughts.   Says she is able to vocalize her thoughts but face still hurts.

## 2023-05-18 NOTE — ED Notes (Signed)
ED Provider at bedside. 

## 2023-05-19 ENCOUNTER — Emergency Department (HOSPITAL_COMMUNITY): Payer: Medicare Other

## 2023-05-19 DIAGNOSIS — I6782 Cerebral ischemia: Secondary | ICD-10-CM | POA: Diagnosis not present

## 2023-05-19 NOTE — Discharge Instructions (Signed)
Your evaluation in the ED today was reassuring.  Your MRI did not show any acute or emergent process.  We recommend close follow-up with your primary care doctor.  Continue your prescribed medications.  Return for new or concerning symptoms.

## 2023-05-19 NOTE — ED Provider Notes (Signed)
[place] EMERGENCY DEPARTMENT AT [facility] Provider Note   CSN: [id number] Arrival date & time: 05/18/23  1941     History {Add pertinent medical, surgical, social history, OB history to HPI:1} Chief Complaint  Patient presents with  . Weakness    Jamie Burke is a 77 y.o. female.  The history is provided by the patient and a relative. No language interpreter was used.  Weakness      Home Medications Prior to Admission medications   Medication Sig Start Date End Date Taking? Authorizing Provider  ACCU-CHEK GUIDE test strip USE AS INSTRUCTED TO TEST BLOOD  SUGAR 3 TIMES DAILY 12/19/22   Shamleffer, Konrad Dolores, MD  Accu-Chek Softclix Lancets lancets USE AS INSTRUCTED 3 TIMES  DAILY 12/01/22   Shamleffer, Konrad Dolores, MD  Blood Glucose Monitoring Suppl (ACCU-CHEK GUIDE ME) w/Device KIT Use as directed 3 times daily to test blood sugar E11.65 12/01/22   Shamleffer, Konrad Dolores, MD  dicyclomine (BENTYL) 20 MG tablet TAKE 1 TABLET BY MOUTH THREE TIMES DAILY AS NEEDED FOR CRAMPS WITH FOOD    [provider]  ezetimibe (ZETIA) 10 MG tablet Take 1 tablet (10 mg total) by mouth daily. 05/13/22 05/08/23  Alver Sorrow, NP  famotidine (PEPCID) 10 MG tablet Take 10 mg by mouth as needed.    [provider]  hydrochlorothiazide (HYDRODIURIL) 25 MG tablet Take 25 mg by mouth every other day.    [provider]  Insulin Lispro Prot & Lispro (HUMALOG MIX 75/25 KWIKPEN) (75-25) 100 UNIT/ML Kwikpen Inject 64 Units into the skin daily with breakfast AND 60 Units daily with supper. 04/17/23   Shamleffer, Konrad Dolores, MD  Insulin Pen Needle 32G X 4 MM MISC 1 Device by Does not apply route in the morning and at bedtime. 04/17/23   Shamleffer, Konrad Dolores, MD  naproxen (NAPROSYN) 375 MG tablet Take 375 mg by mouth 2 (two) times daily as needed. 06/07/22   [provider]  ondansetron (ZOFRAN-ODT) 4 MG disintegrating tablet Take 4 mg by  mouth every 8 (eight) hours as needed. 03/05/22   [provider]      Allergies    Atenolol, Atorvastatin, Canagliflozin, Liraglutide, Lisinopril, Lisinopril-hydrochlorothiazide, Losartan potassium-hctz, Metformin hcl, Sumatriptan, and Sulfa antibiotics    Review of Systems   Review of Systems  Neurological:  Positive for weakness.  Ten systems reviewed and are negative for acute change, except as noted in the HPI.    Physical Exam Updated Vital Signs BP 138/77   Pulse 87   Temp 99 F (37.2 C) (Oral)   Resp 20   Ht 5\' 4"  (1.626 m)   Wt 99.8 kg   SpO2 98%   BMI 37.76 kg/m   Physical Exam Vitals and nursing note reviewed.  Constitutional:      General: She is not in acute distress.    Appearance: She is well-developed. She is not diaphoretic.     Comments: Nontoxic appearing and in NAD  HENT:     Head: Normocephalic and atraumatic.     Comments: No discreet TTP over temporal artery Eyes:     General: No scleral icterus.    Extraocular Movements: Extraocular movements intact and EOM normal.     Conjunctiva/sclera: Conjunctivae normal.     Pupils: Pupils are equal, round, and reactive to light.     Comments: No pain with EOMs. No proptosis or hyphema. IOP 23 OD w/95% CI.  Pulmonary:     Effort: Pulmonary  effort is normal. No respiratory distress.     Comments: Respirations even and unlabored. Musculoskeletal:        General: Normal range of motion.     Cervical back: Normal range of motion.  Skin:    General: Skin is warm and dry.     Coloration: Skin is not pale.     Findings: No erythema or rash.  Neurological:     Mental Status: She is alert and oriented to person, place, and time.     Cranial Nerves: No cranial nerve deficit.     Coordination: Coordination normal.     Comments: GCS 15. Speech is goal oriented. No deficits appreciated to CN III-XII; symmetric eyebrow raise, no facial drooping, tongue midline. No other focal deficits noted. Sensation to  light touch intact. Patient moves extremities without ataxia.  Psychiatric:        Mood and Affect: Mood and affect normal.        Behavior: Behavior normal.     ED Results / Procedures / Treatments   Labs (all labs ordered are listed, but only abnormal results are displayed) Labs Reviewed  BASIC METABOLIC PANEL - Abnormal; Notable for the following components:      Result Value   Glucose, Bld 125 (*)    All other components within normal limits  CBC - Abnormal; Notable for the following components:   MCH 25.8 (*)    All other components within normal limits  URINALYSIS, ROUTINE W REFLEX MICROSCOPIC - Abnormal; Notable for the following components:   Color, Urine STRAW (*)    APPearance HAZY (*)    Specific Gravity, Urine 1.004 (*)    Leukocytes,Ua LARGE (*)    Bacteria, UA RARE (*)    Non Squamous Epithelial 0-5 (*)    All other components within normal limits  CBG MONITORING, ED - Abnormal; Notable for the following components:   Glucose-Capillary 115 (*)    All other components within normal limits    EKG EKG Interpretation Date/Time:  Thursday May 18 2023 19:55:24 EDT Ventricular Rate:  102 PR Interval:  182 QRS Duration:  154 QT Interval:  382 QTC Calculation: 497 R Axis:   -33  Text Interpretation: Sinus tachycardia Left axis deviation Right bundle branch block When compared with ECG of 10-Oct-2009 17:13, PREVIOUS ECG IS PRESENT No significant change since last tracing Confirmed by Gwyneth Sprout ([id number]) on 05/18/2023 9:24:07 PM  Radiology MR BRAIN WO CONTRAST  Result Date: 05/19/2023 CLINICAL DATA:  Initial evaluation for neuro deficit, stroke suspected. EXAM: MRI HEAD WITHOUT CONTRAST TECHNIQUE: Multiplanar, multiecho pulse sequences of the brain and surrounding structures were obtained without intravenous contrast. COMPARISON:  Prior CT from 10/10/2009. FINDINGS: Brain: Cerebral volume within normal limits. Patchy T2/FLAIR hyperintensity involving the  periventricular and deep white matter, consistent with chronic small vessel ischemic disease, mild for age. No evidence for acute or subacute ischemia. Gray-white matter differentiation maintained. Arteries of chronic cortical infarction. No acute or chronic intracranial blood products. No mass lesion, midline shift or mass effect for no hydrocephalus or extra-axial fluid collection. Pituitary gland suprasellar region within normal limits. Vascular: Major intracranial vascular flow voids are maintained. Skull and upper cervical spine: Craniocervical junction within normal limits. Bone marrow signal intensity diffusely decreased on T1 weighted imaging, nonspecific, but most commonly related to anemia, smoking, or obesity. No scalp soft tissue abnormality. Sinuses/Orbits: Prior ocular lens replacement on the right. Mild scattered mucosal thickening present left ethmoidal air cells and maxillary sinuses. No mastoid  effusion. Other: None. IMPRESSION: 1. No acute intracranial abnormality. 2. Mild chronic microvascular ischemic disease for age. Electronically Signed   By: Rise Mu M.D.   On: 05/19/2023 03:04    Procedures Procedures  {Document cardiac monitor, telemetry assessment procedure when appropriate:1}  Medications Ordered in ED Medications  acetaminophen (TYLENOL) tablet 1,000 mg (1,000 mg Oral Given 05/18/23 2304)    ED Course/ Medical Decision Making/ A&P Clinical Course as of 05/19/23 0334  Fri May 19, 2023  1610 Headache improved with Tylenol. MRI negative. [KH]    Clinical Course User Index [KH] Antony Madura, PA-C   {   Click here for ABCD2, HEART and other calculatorsREFRESH Note before signing :1}                          Medical Decision Making Amount and/or Complexity of Data Reviewed Labs: ordered. Radiology: ordered.  Risk OTC drugs.   ***  {Document critical care time when appropriate:1} {Document review of labs and clinical decision tools ie heart score,  Chads2Vasc2 etc:1}  {Document your independent review of radiology images, and any outside records:1} {Document your discussion with family members, caretakers, and with consultants:1} {Document social determinants of health affecting pt's care:1} {Document your decision making why or why not admission, treatments were needed:1} Final Clinical Impression(s) / ED Diagnoses Final diagnoses:  None    Rx / DC Orders ED Discharge Orders     None

## 2023-05-22 DIAGNOSIS — R519 Headache, unspecified: Secondary | ICD-10-CM | POA: Diagnosis not present

## 2023-05-22 DIAGNOSIS — Z794 Long term (current) use of insulin: Secondary | ICD-10-CM | POA: Diagnosis not present

## 2023-05-22 DIAGNOSIS — H9202 Otalgia, left ear: Secondary | ICD-10-CM | POA: Diagnosis not present

## 2023-05-22 DIAGNOSIS — E1142 Type 2 diabetes mellitus with diabetic polyneuropathy: Secondary | ICD-10-CM | POA: Diagnosis not present

## 2023-06-08 DIAGNOSIS — L918 Other hypertrophic disorders of the skin: Secondary | ICD-10-CM | POA: Diagnosis not present

## 2023-06-08 DIAGNOSIS — D1801 Hemangioma of skin and subcutaneous tissue: Secondary | ICD-10-CM | POA: Diagnosis not present

## 2023-06-08 DIAGNOSIS — L858 Other specified epidermal thickening: Secondary | ICD-10-CM | POA: Diagnosis not present

## 2023-06-08 DIAGNOSIS — Z85828 Personal history of other malignant neoplasm of skin: Secondary | ICD-10-CM | POA: Diagnosis not present

## 2023-06-08 DIAGNOSIS — L821 Other seborrheic keratosis: Secondary | ICD-10-CM | POA: Diagnosis not present

## 2023-06-08 DIAGNOSIS — L819 Disorder of pigmentation, unspecified: Secondary | ICD-10-CM | POA: Diagnosis not present

## 2023-06-08 DIAGNOSIS — L820 Inflamed seborrheic keratosis: Secondary | ICD-10-CM | POA: Diagnosis not present

## 2023-06-19 NOTE — Progress Notes (Unsigned)
Cardiology Clinic Note   [name] Date of Encounter: 06/22/2023  Primary Care Provider:  Deatra James, MD Primary Cardiologist:  Chilton Si, MD  Patient Profile    77 year old female with a hx of CAD, HLD, RBBB, HTN, diabetes, GERD, gastroparesis. Cardiac CTA 04/19/22 with calcium score 170 placing her in 77th percentile for age/sex. Nonobstructive coronary disease (ost LAD 25-49%, prox/mid LAD 1-25%, OM1 25-49%, AV groove 25-49%. Previous intolerance to statin due to myalgia.   Past Medical History    Past Medical History:  Diagnosis Date   Diabetes mellitus    Dysplastic nevi    Gastroparesis    GERD (gastroesophageal reflux disease)    Hypertension    Melanoma (HCC)    Snoring 03/28/2022   Tachycardia 03/28/2022   Past Surgical History:  Procedure Laterality Date   ABDOMINAL HYSTERECTOMY     CARPAL TUNNEL RELEASE     BL   TRIGGER FINGER RELEASE      Allergies  Allergies  Allergen Reactions   Atenolol Other (See Comments)   Atorvastatin Other (See Comments)   Canagliflozin Other (See Comments)   Liraglutide Other (See Comments)   Lisinopril Swelling   Lisinopril-Hydrochlorothiazide Other (See Comments)   Losartan Potassium-Hctz Other (See Comments)   Metformin Hcl Other (See Comments)   Sumatriptan Other (See Comments)    Very crazy dreams    Sulfa Antibiotics Rash    History of Present Illness    Jamie Burke comes today for ongoing assessment and management of CAD, HTN, and hyperlipidemia.  She continues to have some right sided gnawing chest pain that is fleeting.  Her main complaint today is fatigue, dyspnea, she states that her family says that she snores very loudly and often wakes up at night several times.  She feels as if she is not getting sleep at night and does have some daytime somnolence.  Other complaints are that of GERD and she is on a PPI through her primary care provider.   Home Medications    Current Outpatient  Medications  Medication Sig Dispense Refill   ACCU-CHEK GUIDE test strip USE AS INSTRUCTED TO TEST BLOOD  SUGAR 3 TIMES DAILY 300 strip 2   Accu-Chek Softclix Lancets lancets USE AS INSTRUCTED 3 TIMES  DAILY 200 each 4   Blood Glucose Monitoring Suppl (ACCU-CHEK GUIDE ME) w/Device KIT Use as directed 3 times daily to test blood sugar E11.65 1 kit 0   hydrochlorothiazide (HYDRODIURIL) 25 MG tablet Take 25 mg by mouth every other day.     Insulin Lispro Prot & Lispro (HUMALOG MIX 75/25 KWIKPEN) (75-25) 100 UNIT/ML Kwikpen Inject 64 Units into the skin daily with breakfast AND 60 Units daily with supper. 120 mL 3   Insulin Pen Needle 32G X 4 MM MISC 1 Device by Does not apply route in the morning and at bedtime. 200 each 3   naproxen (NAPROSYN) 375 MG tablet Take 375 mg by mouth 2 (two) times daily as needed.     dicyclomine (BENTYL) 20 MG tablet TAKE 1 TABLET BY MOUTH THREE TIMES DAILY AS NEEDED FOR CRAMPS WITH FOOD (Patient not taking: Reported on 06/22/2023)     ezetimibe (ZETIA) 10 MG tablet Take 1 tablet (10 mg total) by mouth daily. (Patient not taking: Reported on 06/22/2023) 90 tablet 3   No current facility-administered medications for this visit.     Family History    Family History  Problem Relation Age of Onset   Diabetes  Mother    Heart disease Mother    Hypertension Mother    Stroke Mother    Hypertension Father    Stroke Father    Cancer Brother 75       Lung Cancer   Kidney disease Brother    Heart disease Maternal Aunt    She indicated that her mother is deceased. She indicated that her father is deceased. She indicated that her brother is deceased. She indicated that her son is alive. She indicated that the status of her maternal aunt is unknown.  Social History    Social History   Socioeconomic History   Marital status: Married    Spouse name: Not on file   Number of children: Not on file   Years of education: Not on file   Highest education level: Not on file   Occupational History   Not on file  Tobacco Use   Smoking status: Never   Smokeless tobacco: Never  Vaping Use   Vaping status: Never Used  Substance and Sexual Activity   Alcohol use: No   Drug use: No   Sexual activity: Never    Birth control/protection: Surgical  Other Topics Concern   Not on file  Social History Narrative   Not on file   Social Determinants of Health   Financial Resource Strain: Not on file  Food Insecurity: Not on file  Transportation Needs: Not on file  Physical Activity: Not on file  Stress: Not on file  Social Connections: Not on file  Intimate Partner Violence: Not on file     Review of Systems    General:  No chills, fever, night sweats or weight changes.  Trouble sleeping with snoring daytime somnolence Cardiovascular:  No chest pain, dyspnea on exertion, edema, orthopnea, palpitations, paroxysmal nocturnal dyspnea. Dermatological: No rash, lesions/masses Respiratory: No cough, daytime dyspnea Urologic: No hematuria, dysuria Abdominal:   No nausea, vomiting, diarrhea, bright red blood per rectum, melena, or hematemesis Neurologic:  No visual changes, wkns, changes in mental status. All other systems reviewed and are otherwise negative except as noted above.       Physical Exam    VS:  BP 128/66   Pulse 98   Ht 5\' 4"  (1.626 m)   Wt 219 lb 12.8 oz (99.7 kg)   SpO2 98%   BMI 37.73 kg/m  , BMI Body mass index is 37.73 kg/m. STOP-Bang Score:  5      GEN: Well nourished, well developed, in no acute distress.  Moderately obese HEENT: normal. Neck: Supple, no JVD, carotid bruits, or masses. Cardiac: RRR, no murmurs, rubs, or gallops. No clubbing, cyanosis, edema.   radials/DP/PT 2+ and equal bilaterally.  Respiratory:  Respirations regular and unlabored, clear to auscultation bilaterally. GI: Soft, nontender, nondistended, BS + x 4. MS: no deformity or atrophy. Skin: warm and dry, no rash. Neuro:  Strength and sensation are  intact. Psych: Normal affect.      Lab Results  Component Value Date   WBC 10.0 05/18/2023   HGB 12.1 05/18/2023   HCT 39.4 05/18/2023   MCV 84.0 05/18/2023   PLT 297 05/18/2023   Lab Results  Component Value Date   CREATININE 0.75 05/18/2023   BUN 8 05/18/2023   NA 137 05/18/2023   K 3.7 05/18/2023   CL 103 05/18/2023   CO2 25 05/18/2023   Lab Results  Component Value Date   ALT 13 03/11/2013   AST 22 03/11/2013   ALKPHOS 77 03/11/2013  BILITOT 0.3 03/11/2013   Lab Results  Component Value Date   CHOL 183 07/28/2021   HDL 35.90 (L) 07/28/2021   LDLCALC 119 (H) 07/28/2021   LDLDIRECT 137 (H) 03/15/2011   TRIG 139.0 07/28/2021   CHOLHDL 5 07/28/2021    Lab Results  Component Value Date   HGBA1C 8.6 (A) 04/17/2023     Review of Prior Studies    Cardiac CTA 04/19/22 FINDINGS: Non-cardiac: See [facility]. No significant findings on limited lung and soft tissue windows.   Calcium score: Calcium noted in LAD and OM   LM: 0   RCA: 0   LCX: 40   LAD 1130   Total: 170 which is 77 th percentile for age/sex   Coronary Arteries: Right dominant with no anomalies   LM: Normal   LAD: 25-49% mixed plaque in ostial LAD 1-24% calcified plaque in proximal and mid LAD   D1: Normal   Circumflex: Normal   OM1: 25-49% mixed plaque in proximal vessel   AV groove : Small vessel 25-49% soft plaque   RCA:  Normal   PDA: Normal   PLA: Normal   IMPRESSION: 1. Calcium score 170 which is 77 th percentile for age/sex   2.  Normal ascending thoracic aorta 3.0 cm   3.  CAD RADS 2 non obstructive CAD see description above   Nuclear Stress Test  08/07/2018: The left ventricular ejection fraction is normal (55-65%). Nuclear stress EF: 57%. Blood pressure demonstrated a hypertensive response to exercise. No changes from baseline EKG showing RBBB The study is normal. This is a low risk study.    Assessment & Plan   1.   Hypertension: Blood pressure is well-controlled today on HCTZ 25 mg daily.  No changes in her regimen at this time.  2.  Daytime somnolence with snoring: She has been scheduled for a home sleep study.  I have explained this to her and she is willing to proceed to evaluate for sleep apnea which may be causing her symptoms. STOP BANG Score 5.   3.  Hypercholesterolemia: Had been on Zetia 10 mg daily.  She is no longer taking and has had labs followed by primary care provider.  4.  Insulin-dependent diabetes: Followed by primary care provider with ongoing labs.  5.  Class I obesity: She is requesting help with weight loss.  I am referring her to [facility].         Signed, Bettey Mare. Liborio Nixon, ANP, AACC   06/22/2023 9:46 AM      Office [phone] Fax [phone]  Notice: This dictation was prepared with Dragon dictation along with smaller phrase technology. Any transcriptional errors that result from this process are unintentional and may not be corrected upon review.

## 2023-06-22 ENCOUNTER — Ambulatory Visit: Payer: Medicare Other | Attending: Cardiovascular Disease | Admitting: Adult Health

## 2023-06-22 ENCOUNTER — Encounter: Payer: Self-pay | Admitting: Adult Health

## 2023-06-22 VITALS — BP 128/66 | HR 98 | Ht 64.0 in | Wt 219.8 lb

## 2023-06-22 DIAGNOSIS — R0683 Snoring: Secondary | ICD-10-CM

## 2023-06-22 DIAGNOSIS — R079 Chest pain, unspecified: Secondary | ICD-10-CM

## 2023-06-22 NOTE — Patient Instructions (Addendum)
Medication Instructions:  No changes *If you need a refill on your cardiac medications before your next appointment, please call your pharmacy*   Lab Work: No Labs If you have labs (blood work) drawn today and your tests are completely normal, you will receive your results only by: MyChart Message (if you have MyChart) OR A paper copy in the mail If you have any lab test that is abnormal or we need to change your treatment, we will call you to review the results.   Testing/Procedures: [facility]. Your physician has recommended that you have a sleep study. This test records several body functions during sleep, including: brain activity, eye movement, oxygen and carbon dioxide blood levels, heart rate and rhythm, breathing rate and rhythm, the flow of air through your mouth and nose, snoring, body muscle movements, and chest and belly movement.    Follow-Up: At [facility], you and your health needs are our priority.  As part of our continuing mission to provide you with exceptional heart care, we have created designated Provider Care Teams.  These Care Teams include your primary Cardiologist (physician) and Advanced Practice Providers (APPs -  Physician Assistants and Nurse Practitioners) who all work together to provide you with the care you need, when you need it.  We recommend signing up for the patient portal called "MyChart".  Sign up information is provided on this After Visit Summary.  MyChart is used to connect with patients for Virtual Visits (Telemedicine).  Patients are able to view lab/test results, encounter notes, upcoming appointments, etc.  Non-urgent messages can be sent to your provider as well.   To learn more about what you can do with MyChart, go to [email or web address].    Your next appointment:   2 month(s)  Provider:   Chilton Si, MD

## 2023-06-27 ENCOUNTER — Telehealth: Payer: Self-pay | Admitting: Cardiovascular Disease

## 2023-06-27 NOTE — Telephone Encounter (Signed)
Looks like patient seen by Jamie Burke on 8/29 and home sleep test ordered, please update patient if able

## 2023-06-27 NOTE — Telephone Encounter (Signed)
Patient is requesting call back to get information on when someone will be calling her back in regards to sleep study she was told she would need. Please advise.

## 2023-06-29 ENCOUNTER — Telehealth: Payer: Self-pay | Admitting: *Deleted

## 2023-06-29 DIAGNOSIS — R0683 Snoring: Secondary | ICD-10-CM

## 2023-06-29 DIAGNOSIS — I25118 Atherosclerotic heart disease of native coronary artery with other forms of angina pectoris: Secondary | ICD-10-CM

## 2023-06-29 DIAGNOSIS — I10 Essential (primary) hypertension: Secondary | ICD-10-CM

## 2023-06-29 NOTE — Telephone Encounter (Signed)
Prior Authorization for [facility] sent to [facility] via web portal. Tracking Number . NO PA REQ  NO order was placed on appt desk. I have placed the order for Jamie Burke, CMA.who worked with [facility] on 06/22/23.

## 2023-07-11 ENCOUNTER — Other Ambulatory Visit: Payer: Self-pay

## 2023-07-11 DIAGNOSIS — R0683 Snoring: Secondary | ICD-10-CM

## 2023-07-11 NOTE — Telephone Encounter (Addendum)
Patient has been scheduled 10/7, she is aware

## 2023-07-28 ENCOUNTER — Encounter (HOSPITAL_BASED_OUTPATIENT_CLINIC_OR_DEPARTMENT_OTHER): Payer: Self-pay | Admitting: Cardiovascular Disease

## 2023-07-28 ENCOUNTER — Ambulatory Visit (HOSPITAL_BASED_OUTPATIENT_CLINIC_OR_DEPARTMENT_OTHER): Payer: Medicare Other | Admitting: Cardiovascular Disease

## 2023-07-28 VITALS — BP 152/82 | HR 87 | Ht 64.0 in | Wt 223.5 lb

## 2023-07-28 DIAGNOSIS — I10 Essential (primary) hypertension: Secondary | ICD-10-CM

## 2023-07-28 DIAGNOSIS — Z5181 Encounter for therapeutic drug level monitoring: Secondary | ICD-10-CM | POA: Diagnosis not present

## 2023-07-28 DIAGNOSIS — E785 Hyperlipidemia, unspecified: Secondary | ICD-10-CM | POA: Diagnosis not present

## 2023-07-28 DIAGNOSIS — E6609 Other obesity due to excess calories: Secondary | ICD-10-CM

## 2023-07-28 DIAGNOSIS — I4510 Unspecified right bundle-branch block: Secondary | ICD-10-CM | POA: Diagnosis not present

## 2023-07-28 DIAGNOSIS — Z6838 Body mass index (BMI) 38.0-38.9, adult: Secondary | ICD-10-CM | POA: Diagnosis not present

## 2023-07-28 DIAGNOSIS — E66812 Obesity, class 2: Secondary | ICD-10-CM

## 2023-07-28 MED ORDER — HYDROCHLOROTHIAZIDE 25 MG PO TABS: 25.0000 mg | ORAL_TABLET | Freq: Every day | ORAL | 3 refills | Status: DC

## 2023-07-28 MED ORDER — ROSUVASTATIN CALCIUM 10 MG PO TABS: 10.0000 mg | ORAL_TABLET | Freq: Every day | ORAL | 3 refills | Status: DC

## 2023-07-28 NOTE — Progress Notes (Signed)
Cardiology Office Note:  .    Date:  08/11/2023  ID:  Jamie Burke, DOB 01-24-1946, MRN [id number] PCP: Deatra James, MD  [facility] Providers Cardiologist:  Chilton Si, MD     History of Present Illness: Jamie Burke    Jamie Burke is a 77 y.o. female with a hx of nonobstructive CAD, RBBB, hypertension, diabetes, GERD, and gastroparesis, here for follow-up. She was seen 03/28/2022 to re-establish care. She was previously a patient of Dr. Dulce Sellar, last seen in 2019. Prior nuclear stress 07/2018 testing was low risk and LVEF was 57%. He recommended starting pravastatin.   At her visit 03/2022, she confirmed a prior diagnosis of RBBB in 2020. She had presented to urgent care in 12/2021 with chest pain. She complained of intermittent, sharp right-sided chest pain every 2-3 days lasting for a minute and sometimes associated with flutters. At home her blood pressure was usually stable in the 130's systolic. She noted persistent LE edema due to her diabetes and struggled with knee pain that severely limited her exercise. She complained of snoring and was recommended for a sleep study. She had a coronary CTA 03/2022 revealing a coronary calcium score of 170 with evidence of nonobstructive CAD. Recommended she continue her statin therapy and increase her exercise to at least 150 minutes weekly. She was seen by Joni Reining, NP 05/2023 and noted fatigue, dyspnea, and significant snoring. She is scheduled for an in lab sleep study 07/31/23.  Today, she states she hasn't been feeling well especially since her ED visit in 04/2023. She had presented with complaints of headaches and speech difficulty/delay. Brain MRI showed no evidence of acute intracranial abnormality. Around that time she had also noted abnormal breathing, described like her breathing was slowing down. However, this seems to have mostly resolved and she only becomes short of breath with overexertion. She continues to struggle with fatigue  and daytime somnolence, sometimes limiting her ADL's. At one time she was experiencing right-sided breast/chest pain. She does not recall if she was active or at rest. She notes some swelling in her ankles, and has been diagnosed with neuropathy. In the office her blood pressure is elevated to 162/82 initially, and 152/82 on manual recheck. A few days ago her BP was 130/69 at home. Since her readings at home are typically normal she will only take her antihypertensives about 3 times per week. For activity she recently started back with chair exercises, but developed myalgias. She will continue to work on gradually increasing her exercise. She confirms that she had tried the PREP program but eventually stopped due to a loss of motivation. She is no longer taking Zetia due to her GI issues. She denies any palpitations, lightheadedness, headaches, syncope, orthopnea, or PND.  ROS:  Please see the history of present illness. All other systems are reviewed and negative.  (+) Exertional shortness of breath (+) Fatigue (+) Daytime Somnolence (+) Bilateral ankle edema  Studies Reviewed: Jamie Burke        Coronary CTA  04/19/2022: IMPRESSION: 1. Calcium score 170 which is 77 th percentile for age/sex   2.  Normal ascending thoracic aorta 3.0 cm   3.  CAD RADS 2 non obstructive CAD see description above  Risk Assessment/Calculations:    STOP-Bang Score:  5      Physical Exam:    VS:  BP (!) 152/82 (BP Location: Right Arm, Patient Position: Sitting, Cuff Size: Large)   Pulse 87   Ht 5\' 4"  (1.626 m)  Wt 223 lb 8 oz (101.4 kg)   SpO2 98%   BMI 38.36 kg/m  , BMI Body mass index is 38.36 kg/m. GENERAL:  Well appearing HEENT: Pupils equal round and reactive, fundi not visualized, oral mucosa unremarkable NECK:  No jugular venous distention, waveform within normal limits, carotid upstroke brisk and symmetric, no bruits, no thyromegaly LUNGS:  Clear to auscultation bilaterally HEART:  RRR.  PMI not  displaced or sustained,S1 and S2 within normal limits, no S3, no S4, no clicks, no rubs, no murmurs ABD:  Flat, positive bowel sounds normal in frequency in pitch, no bruits, no rebound, no guarding, no midline pulsatile mass, no hepatomegaly, no splenomegaly EXT:  2 plus pulses throughout, no edema, no cyanosis no clubbing SKIN:  No rashes no nodules NEURO:  Cranial nerves II through XII grossly intact, motor grossly intact throughout PSYCH:  Cognitively intact, oriented to person place and time  Wt Readings from Last 3 Encounters:  08/08/23 223 lb (101.2 kg)  08/02/23 222 lb (100.7 kg)  07/31/23 223 lb (101.2 kg)     ASSESSMENT AND PLAN: .    # Hyperlipidemia Elevated LDL and triglycerides noted in May 2023. Patient has not been taking Zetia due to gastrointestinal upset. Previous intolerance to atorvastatin. -Start rosuvastatin 10mg  daily. -Check lipid panel and comprehensive metabolic panel 2-3 months.  # Hypertension Blood pressure elevated at today's visit (152/82). Patient reports taking hydrochlorothiazide inconsistently (3 days/week). -Increase hydrochlorothiazide to daily. -Provide blood pressure log for patient to track readings at home. -Recheck blood pressure in 1-2 months.  # Sleep Apnea Patient reports excessive daytime sleepiness and has been referred for sleep study. -Encourage patient to complete sleep study.  # General Malaise Patient reports feeling unwell since July, with symptoms including headaches, difficulty speaking, and fatigue. No clear etiology identified. -Encourage patient to follow up with gastroenterologist for gastrointestinal symptoms. -Encourage patient to increase physical activity as tolerated. -Sleep study as above.   # Right Bundle Branch Block Patient reports chest discomfort, but this is not typically associated with RBBB. No acute cardiac symptoms. -Continue to monitor.  # Obesity Patient acknowledges need for weight loss but  struggles with motivation and dietary adherence. -Refer to nutritionist for dietary counseling. -Consider referral to [facility] program for comprehensive weight management support.          Dispo:  FU with APP or Shaketa Serafin C. Duke Salvia, MD, [facility] in 1-2 months.  I,Mathew Stumpf,acting as a Neurosurgeon for Chilton Si, MD.,have documented all relevant documentation on the behalf of Chilton Si, MD,as directed by  Chilton Si, MD while in the presence of Chilton Si, MD.  I, Emilya Justen C. Duke Salvia, MD have reviewed all documentation for this visit.  The documentation of the exam, diagnosis, procedures, and orders on 08/11/2023 are all accurate and complete.   Signed, Chilton Si, MD

## 2023-07-28 NOTE — Patient Instructions (Addendum)
Medication Instructions:  START ROSUVASTATIN 10 MG DAILY   INCREASE hydrochlorothiazide TO DAILY   *If you need a refill on your cardiac medications before your next appointment, please call your pharmacy*  Lab Work: FASTING LP/CMET IN 1 WEEK   If you have labs (blood work) drawn today and your tests are completely normal, you will receive your results only by: MyChart Message (if you have MyChart) OR A paper copy in the mail If you have any lab test that is abnormal or we need to change your treatment, we will call you to review the results.  Testing/Procedures: NONE  Follow-Up: At [facility], you and your health needs are our priority.  As part of our continuing mission to provide you with exceptional heart care, we have created designated Provider Care Teams.  These Care Teams include your primary Cardiologist (physician) and Advanced Practice Providers (APPs -  Physician Assistants and Nurse Practitioners) who all work together to provide you with the care you need, when you need it.  We recommend signing up for the patient portal called "MyChart".  Sign up information is provided on this After Visit Summary.  MyChart is used to connect with patients for Virtual Visits (Telemedicine).  Patients are able to view lab/test results, encounter notes, upcoming appointments, etc.  Non-urgent messages can be sent to your provider as well.   To learn more about what you can do with MyChart, go to [email or web address].    Your next appointment:   1 month(s)  Provider:   Chilton Si, MD or Gillian Shields, NP PHARM D    You have been referred to   Amb Referral To Provider Referral Exercise Program (P.R.E.P) Where: [facility] Address: [address] [place] [address] Phone: [phone] If you do not hear from them in 2 weeks you can call them directly   You have been referred to PREP   Other Instructions MONITOR AND LOG YOUR BLOOD PRESSURE TWICE A DAY.  BRING READINGS AND MACHINE TO FOLLOW UP

## 2023-07-31 ENCOUNTER — Ambulatory Visit (HOSPITAL_BASED_OUTPATIENT_CLINIC_OR_DEPARTMENT_OTHER): Payer: Medicare Other | Admitting: Cardiology

## 2023-08-02 ENCOUNTER — Encounter: Payer: Self-pay | Admitting: Internal Medicine

## 2023-08-02 ENCOUNTER — Telehealth: Payer: Self-pay

## 2023-08-02 ENCOUNTER — Ambulatory Visit: Payer: Medicare Other | Admitting: Internal Medicine

## 2023-08-02 VITALS — BP 124/82 | HR 100 | Ht 64.0 in | Wt 222.0 lb

## 2023-08-02 DIAGNOSIS — Z794 Long term (current) use of insulin: Secondary | ICD-10-CM | POA: Diagnosis not present

## 2023-08-02 DIAGNOSIS — E1165 Type 2 diabetes mellitus with hyperglycemia: Secondary | ICD-10-CM | POA: Diagnosis not present

## 2023-08-02 DIAGNOSIS — E1142 Type 2 diabetes mellitus with diabetic polyneuropathy: Secondary | ICD-10-CM

## 2023-08-02 LAB — MICROALBUMIN / CREATININE URINE RATIO
Creatinine,U: 48.1 mg/dL
Microalb Creat Ratio: 1.5 mg/g (ref 0.0–30.0)
Microalb, Ur: <0.7 mg/dL (ref 0.0–1.9)

## 2023-08-02 LAB — POCT GLUCOSE (DEVICE FOR HOME USE): POC Glucose: 174 mg/dL — AB (ref 70–99)

## 2023-08-02 LAB — POCT GLYCOSYLATED HEMOGLOBIN (HGB A1C): Hemoglobin A1C: 8.7 % — AB (ref 4.0–5.6)

## 2023-08-02 LAB — BASIC METABOLIC PANEL
BUN: 10 mg/dL (ref 6–23)
CO2: 27 meq/L (ref 19–32)
Calcium: 9.9 mg/dL (ref 8.4–10.5)
Chloride: 102 meq/L (ref 96–112)
Creatinine, Ser: 0.78 mg/dL (ref 0.40–1.20)
GFR: 73.79 mL/min (ref >60.00)
Glucose, Bld: 162 mg/dL — ABNORMAL HIGH (ref 70–99)
Potassium: 3.8 meq/L (ref 3.5–5.1)
Sodium: 138 meq/L (ref 135–145)

## 2023-08-02 MED ORDER — OZEMPIC (0.25 OR 0.5 MG/DOSE) 2 MG/3ML ~~LOC~~ SOPN: 0.5000 mg | PEN_INJECTOR | SUBCUTANEOUS | 3 refills | Status: DC

## 2023-08-02 NOTE — Telephone Encounter (Signed)
Medication Samples have been provided to the patient.  Drug name: Ozempic        Strength: 0.25mg         Qty: 1  LOT: [id number]  Exp.Date: 11/23/2024  Dosing instructions: INJECT ONCE WEEKLY   The patient has been instructed regarding the correct time, dose, and frequency of taking this medication, including desired effects and most common side effects.   Shayon Trompeter L Ahmiyah Coil 1:52 PM 08/02/2023

## 2023-08-02 NOTE — Patient Instructions (Addendum)
-   Continue   Humalog Mix  64 units with Breakfast and 60 units with Supper - Start Ozempic 0.25 mg weekly for 6 weeks, than increase to 0.5 mg weekly    HOW TO TREAT LOW BLOOD SUGARS (Blood sugar LESS THAN 70 MG/DL) Please follow the RULE OF 15 for the treatment of hypoglycemia treatment (when your (blood sugars are less than 70 mg/dL)   STEP 1: Take 15 grams of carbohydrates when your blood sugar is low, which includes:  3-4 GLUCOSE TABS  OR 3-4 OZ OF JUICE OR REGULAR SODA OR ONE TUBE OF GLUCOSE GEL    STEP 2: RECHECK blood sugar in 15 MINUTES STEP 3: If your blood sugar is still low at the 15 minute recheck --> then, go back to STEP 1 and treat AGAIN with another 15 grams of carbohydrates.

## 2023-08-02 NOTE — Progress Notes (Signed)
Name: Jamie Burke  Age/ Sex: 77 y.o., female   MRN/ DOB: [id number], 1945-10-30     PCP: Deatra James, MD   Reason for Endocrinology Evaluation: Type 2 Diabetes Mellitus  Initial Endocrine Consultative Visit: 12/25/2019    PATIENT IDENTIFIER: Jamie Burke is a 77 y.o. female with a past medical history of T2DM and HTN. The patient has followed with Endocrinology clinic since 12/25/2019 for consultative assistance with management of her diabetes.  DIABETIC HISTORY:  Jamie Burke was diagnosed with DM > 20 yr ago. She has been on various  glycemic agents (metformin- weakness in the legs and incontinence, invokana - weakness in the legs. Victoza , Trulicity , she does not recall any side effects ). Her hemoglobin A1c has ranged from 8.6% in 2012, peaking at 8.9% in 2021.  On her initial visit to our clinic she had an A1c of 9.2%, she was on insulin mix and had metformin but she was not taking it due to GI effects.   She had tried Rybelsus twice the first time she stopped it in October 2022 due to leg pains, the second time she stopped it in May 2023 due to lip swelling   By April 2023 and due to persistent hyperglycemia we will switch Humalog mix to basal/prandial, by May 2023 she called back stating lip swelling with Toujeo and Rybelsus and persistent hyperglycemia.  She returned to using insulin mix and stopped Rybelsus   She did not tolerate Trulicity due to abdominal pain 01/2023  SUBJECTIVE:   During the last visit (04/17/2023): A1c 8.6 %.      Today (08/02/2023): [name] is here for a follow up on diabetes management.  She checks her blood sugars 2 times daily. She  does not have hypoglycemic episodes    Denies nausea or vomiting  Continues with chronic constipation and takes laxatives /stool softners  Had recent ED visit for migraine headaches   Had sleep apnea testing last Monday, will have to redo the test   HOME DIABETES REGIMEN:  Humalog Mix 64 units with  Breakfast and 60 units with Supper -has been taking 60 units twice daily    METER DOWNLOAD SUMMARY: n/a    DIABETIC COMPLICATIONS: Microvascular complications:   Denies: CKD, retinopathy , neuropathy  Last eye exam: 02/2023   Macrovascular complications:    Denies: CAD, PVD, CVA     HISTORY:  Past Medical History:  Past Medical History:  Diagnosis Date   Diabetes mellitus    Dysplastic nevi    Gastroparesis    GERD (gastroesophageal reflux disease)    Hypertension    Melanoma (HCC)    Snoring 03/28/2022   Tachycardia 03/28/2022   Past Surgical History:  Past Surgical History:  Procedure Laterality Date   ABDOMINAL HYSTERECTOMY     CARPAL TUNNEL RELEASE     BL   TRIGGER FINGER RELEASE     Social History:  reports that she has never smoked. She has never used smokeless tobacco. She reports that she does not drink alcohol and does not use drugs. Family History:  Family History  Problem Relation Age of Onset   Diabetes Mother    Heart disease Mother    Hypertension Mother    Stroke Mother    Hypertension Father    Stroke Father    Cancer Brother 79       Lung Cancer   Kidney disease Brother    Heart disease Maternal Aunt  HOME MEDICATIONS: Allergies as of 08/02/2023       Reactions   Atenolol Other (See Comments)   Atorvastatin Other (See Comments)   Canagliflozin Other (See Comments)   Liraglutide Other (See Comments)   Lisinopril Swelling   Lisinopril-hydrochlorothiazide Other (See Comments)   Losartan Potassium-hctz Other (See Comments)   Metformin Hcl Other (See Comments)   Sumatriptan Other (See Comments)   Very crazy dreams   Sulfa Antibiotics Rash        Medication List        Accurate as of August 02, 2023  1:40 PM. If you have any questions, ask your nurse or doctor.          Accu-Chek Guide Me w/Device Kit Use as directed 3 times daily to test blood sugar E11.65   Accu-Chek Guide test strip Generic drug: glucose  blood USE AS INSTRUCTED TO TEST BLOOD  SUGAR 3 TIMES DAILY   Accu-Chek Softclix Lancets lancets USE AS INSTRUCTED 3 TIMES  DAILY   dicyclomine 20 MG tablet Commonly known as: BENTYL   ezetimibe 10 MG tablet Commonly known as: ZETIA Take 1 tablet (10 mg total) by mouth daily.   hydrochlorothiazide 25 MG tablet Commonly known as: HYDRODIURIL Take 1 tablet (25 mg total) by mouth daily.   Insulin Lispro Prot & Lispro (75-25) 100 UNIT/ML Kwikpen Commonly known as: HumaLOG Mix 75/25 KwikPen Inject 64 Units into the skin daily with breakfast AND 60 Units daily with supper.   Insulin Pen Needle 32G X 4 MM Misc 1 Device by Does not apply route in the morning and at bedtime.   naproxen 375 MG tablet Commonly known as: NAPROSYN Take 375 mg by mouth 2 (two) times daily as needed.   pantoprazole 40 MG tablet Commonly known as: PROTONIX Take 40 mg by mouth daily as needed.   rosuvastatin 10 MG tablet Commonly known as: CRESTOR Take 1 tablet (10 mg total) by mouth daily.         OBJECTIVE:   Vital Signs: BP 124/82 (BP Location: Left Arm, Patient Position: Sitting, Cuff Size: Large)   Pulse 100   Ht 5\' 4"  (1.626 m)   Wt 222 lb (100.7 kg)   SpO2 96%   BMI 38.11 kg/m   Wt Readings from Last 3 Encounters:  08/02/23 222 lb (100.7 kg)  07/31/23 223 lb (101.2 kg)  07/28/23 223 lb 8 oz (101.4 kg)     Exam: General: Pt appears well and is in NAD  Lungs: Clear with good BS bilat   Heart: RRR   Extremities: No pretibial edema.   Neuro: MS is good with appropriate affect, pt is alert and Ox3    DM foot exam: 11/11/2022   The skin of the feet is intact without sores or ulcerations. The pedal pulses are 2+ on right and 2+ on left. The sensation is decreased at the heels to a screening 5.07, 10 gram monofilament bilaterally     DATA REVIEWED:  Lab Results  Component Value Date   HGBA1C 8.6 (A) 04/17/2023   HGBA1C 8.9 (A) 11/11/2022   HGBA1C 8.8 (A) 06/17/2022     Latest Reference Range & Units 08/02/23 14:05  Sodium 135 - 145 mEq/L 138  Potassium 3.5 - 5.1 mEq/L 3.8  Chloride 96 - 112 mEq/L 102  CO2 19 - 32 mEq/L 27  Glucose 70 - 99 mg/dL 469 (H)  BUN 6 - 23 mg/dL 10  Creatinine 6.29 - 5.28 mg/dL 4.13  Calcium 8.4 -  10.5 mg/dL 9.9  GFR >19.14 mL/min 73.79    Latest Reference Range & Units 08/02/23 14:05  Creatinine,U mg/dL 78.2  Microalb, Ur 0.0 - 1.9 mg/dL <9.5  MICROALB/CREAT RATIO 0.0 - 30.0 mg/g 1.5           ASSESSMENT / PLAN / RECOMMENDATIONS:   1) Type 2 Diabetes Mellitus, Poorly controlled, With Neuropathic complications - Most recent A1c of 8.6 %. Goal A1c <7.0 %.     -Patient continues with worsening glycemic control, she continues to use less insulin than prescribed, the patient tells me that her glucose looks "good" and she felt that that was the appropriate dose for her -I did explain to the patient that her glucose readings have to be optimal for a total of 90 days before this would reflect on the A1c, and seeing 1 reading occasionally that looks good is not indication to reduce the amount of insulin  - She is intolerant to metformin, as well as invokana -Intolerant to Rybelsus Rybelsus due to leg pains , and the second time lip swelling -I have attempted to switch her from insulin mix to multiple daily injections of insulin but this was not a successful experience for her -She did not tolerate Trulicity due to abdominal pain - She would like to postpone dexcom at this time  -I have recommended Ozempic, caution against GI side effects    MEDICATIONS: Continue Humalog Mix 60 units with breakfast, 60 units with supper Start Ozempic 0.25 mg weekly for 6 weeks then increase to 0.5 mg weekly   EDUCATION / INSTRUCTIONS: BG monitoring instructions: Patient is instructed to check her blood sugars 3 times a day, before meals. Call  Endocrinology clinic if: BG persistently < 70  I reviewed the Rule of 15 for the  treatment of hypoglycemia in detail with the patient. Literature supplied.    2) Diabetic complications:   Eye: Does not have known diabetic retinopathy.  Neuro/ Feet: Does have known diabetic peripheral neuropathy based on her  Exam  Renal: Patient does not have known baseline CKD. She is not on an ACEI/ARB at present.C She is allergic to lisinopril     F/U in 4  months    Signed electronically by: Lyndle Herrlich, MD  [facility] Endocrinology  [facility] [address] [place] [place], [place] [address] Phone: [phone] FAX: [phone]   CC: Deatra James, MD [address] [place] [address] Phone: [phone]  Fax: [phone]  Return to Endocrinology clinic as below: Future Appointments  Date Time Provider Department Center  08/31/2023  8:30 AM [facility] PHARMACIST [facility]

## 2023-08-03 ENCOUNTER — Telehealth: Payer: Self-pay

## 2023-08-03 NOTE — Telephone Encounter (Signed)
Call to pt reference referral to PREP.  Wants try again  Offered MW 08/14/23 either 1p-215p or 230p-345p at [facility] . She would like to know the schedule at [facility] as she thinks she may be closer to Micro. Informed will let that coach know. Pt will let me know which she decides.  Message to [employer]

## 2023-08-04 ENCOUNTER — Encounter: Payer: Self-pay | Admitting: Internal Medicine

## 2023-08-04 ENCOUNTER — Telehealth: Payer: Self-pay

## 2023-08-04 NOTE — Procedures (Signed)
Erroneous encounter

## 2023-08-04 NOTE — Progress Notes (Signed)
This encounter was created in error - please disregard.

## 2023-08-04 NOTE — Telephone Encounter (Signed)
Called to discuss PREP class schedule at [facility], offered next M/W class, 2:30-3:45; she wants to attempt taking that class; will contact later this month to confirm start date and set up assessment visit.

## 2023-08-07 DIAGNOSIS — I10 Essential (primary) hypertension: Secondary | ICD-10-CM | POA: Diagnosis not present

## 2023-08-07 DIAGNOSIS — Z5181 Encounter for therapeutic drug level monitoring: Secondary | ICD-10-CM | POA: Diagnosis not present

## 2023-08-07 LAB — COMPREHENSIVE METABOLIC PANEL
ALT: 11 [IU]/L (ref 0–32)
AST: 15 [IU]/L (ref 0–40)
Albumin: 4.1 g/dL (ref 3.8–4.8)
Alkaline Phosphatase: 96 [IU]/L (ref 44–121)
BUN/Creatinine Ratio: 10 — ABNORMAL LOW (ref 12–28)
BUN: 8 mg/dL (ref 8–27)
Bilirubin Total: 0.3 mg/dL (ref 0.0–1.2)
CO2: 22 mmol/L (ref 20–29)
Calcium: 10.2 mg/dL (ref 8.7–10.3)
Chloride: 103 mmol/L (ref 96–106)
Creatinine, Ser: 0.82 mg/dL (ref 0.57–1.00)
Globulin, Total: 3.3 g/dL (ref 1.5–4.5)
Glucose: 182 mg/dL — ABNORMAL HIGH (ref 70–99)
Potassium: 4.3 mmol/L (ref 3.5–5.2)
Sodium: 140 mmol/L (ref 134–144)
Total Protein: 7.4 g/dL (ref 6.0–8.5)
eGFR: 74 mL/min/{1.73_m2} (ref >59)

## 2023-08-07 LAB — LIPID PANEL
Chol/HDL Ratio: 4.2 {ratio} (ref 0.0–4.4)
Cholesterol, Total: 147 mg/dL (ref 100–199)
HDL: 35 mg/dL — ABNORMAL LOW (ref >39)
LDL Chol Calc (NIH): 92 mg/dL (ref 0–99)
Triglycerides: 106 mg/dL (ref 0–149)
VLDL Cholesterol Cal: 20 mg/dL (ref 5–40)

## 2023-08-08 ENCOUNTER — Ambulatory Visit (HOSPITAL_BASED_OUTPATIENT_CLINIC_OR_DEPARTMENT_OTHER): Payer: Medicare Other | Attending: Adult Health | Admitting: Cardiology

## 2023-08-10 ENCOUNTER — Telehealth: Payer: Self-pay

## 2023-08-10 NOTE — Telephone Encounter (Signed)
Patient advised.

## 2023-08-10 NOTE — Telephone Encounter (Signed)
    Patient calling for lab results 

## 2023-08-11 ENCOUNTER — Encounter (HOSPITAL_BASED_OUTPATIENT_CLINIC_OR_DEPARTMENT_OTHER): Payer: Self-pay | Admitting: Cardiovascular Disease

## 2023-08-11 ENCOUNTER — Telehealth: Payer: Self-pay

## 2023-08-11 NOTE — Telephone Encounter (Signed)
Called to confirm participation in next PREP class at [facility] on October 28, every M/W 2:30-3:45; assessment visit scheduled for Oct 22 at [facility]

## 2023-08-14 NOTE — Progress Notes (Signed)
This encounter was created in error - please disregard.

## 2023-08-14 NOTE — Procedures (Signed)
Erroneous encounter

## 2023-08-15 NOTE — Progress Notes (Signed)
[facility] PREP Evaluation  Patient Details  Name: LAVONNA VALERIE MRN: [id number] Date of Birth: 01-18-1946 Age: 77 y.o. PCP: Deatra James, MD  Vitals:   08/15/23 0947  BP: 134/68  Pulse: 94  SpO2: 96%  Weight: 219 lb 3.2 oz (99.4 kg)     [facility] Eval - 08/15/23 0900       [facility] "PREP" Location   [facility] "PREP" Location [name]      Referral    Referring Provider Duke Salvia    Reason for referral Diabetes;Hypertension;Inactivity;Obesitity/Overweight;High Cholesterol    Program Start Date 08/21/23      Measurement   Waist Circumference 51 inches    Hip Circumference 54 inches    Body fat 46.9 percent      Information for Trainer   Goals --   Lower A1c (currently 8.70, Lose 10-15 pounds by end of program, Goal BP < 120/80   Current Exercise none    Orthopedic Concerns --   knee arthritis, hip/back pain   Pertinent Medical History --   OSA, HTN, diabetes, hyperlipidemia     Timed Up and Go (TUGS)   Timed Up and Go Low risk <9 seconds      Mobility and Daily Activities   I find it easy to walk up or down two or more flights of stairs. 2    I have no trouble taking out the trash. 4    I do housework such as vacuuming and dusting on my own without difficulty. 2    I can easily lift a gallon of milk (8lbs). 4    I can easily walk a mile. 1    I have no trouble reaching into high cupboards or reaching down to pick up something from the floor. 2    I do not have trouble doing out-door work such as Loss adjuster, chartered, raking leaves, or gardening. 1      Mobility and Daily Activities   I feel younger than my age. 4    I feel independent. 4    I feel energetic. 1    I live an active life.  1    I feel strong. 1    I feel healthy. 2    I feel active as other people my age. 2      How fit and strong are you.   Fit and Strong Total Score 31            Past Medical History:  Diagnosis Date   Diabetes mellitus    Dysplastic nevi    Gastroparesis    GERD  (gastroesophageal reflux disease)    Hypertension    Melanoma (HCC)    Snoring 03/28/2022   Tachycardia 03/28/2022   Past Surgical History:  Procedure Laterality Date   ABDOMINAL HYSTERECTOMY     CARPAL TUNNEL RELEASE     BL   TRIGGER FINGER RELEASE     Social History   Tobacco Use  Smoking Status Never  Smokeless Tobacco Never  To begin PREP class at [facility] on October 28, every M/W 2:30-3:45  Sonia Baller 08/15/2023, 9:54 AM

## 2023-08-17 ENCOUNTER — Encounter (INDEPENDENT_AMBULATORY_CARE_PROVIDER_SITE_OTHER): Payer: Medicare Other | Admitting: Family Medicine

## 2023-08-21 ENCOUNTER — Telehealth: Payer: Self-pay | Admitting: Internal Medicine

## 2023-08-21 NOTE — Telephone Encounter (Signed)
Patient has been taking the Humalog 64 breakfast and 60 supper as well.

## 2023-08-21 NOTE — Progress Notes (Signed)
[facility] PREP Weekly Session  Patient Details  Name: Jamie Burke MRN: [id number] Date of Birth: [birth year] Age: 77 y.o. PCP: Deatra James, MD  There were no vitals filed for this visit.   [facility] Weekly seesion - 08/21/23 1600       [facility] "PREP" Location   [facility] "PREP" Location [name]      Weekly Session   Topic Discussed Goal setting and welcome to the program   introductions, review of notebook, tour of facility   Classes attended to date 1             [name] 08/21/2023, 4:44 PM

## 2023-08-21 NOTE — Telephone Encounter (Signed)
Patient is calling to say that since she has been using Ozempic that her blood sugar levels have been low at 55, 56 , and 71.  Last night at around 11:30 PM it was 56.  On Saturday 09/19/2023 at around 2:00 PM it was 71.  Patient states that this has been going on for the last two weeks.  Patient would like to know what she needs to do.

## 2023-08-21 NOTE — Telephone Encounter (Signed)
Patient advised and will make medication adjustment  

## 2023-08-23 ENCOUNTER — Telehealth: Payer: Self-pay | Admitting: *Deleted

## 2023-08-23 DIAGNOSIS — I10 Essential (primary) hypertension: Secondary | ICD-10-CM

## 2023-08-23 DIAGNOSIS — I25118 Atherosclerotic heart disease of native coronary artery with other forms of angina pectoris: Secondary | ICD-10-CM

## 2023-08-23 DIAGNOSIS — R0683 Snoring: Secondary | ICD-10-CM

## 2023-08-23 NOTE — Telephone Encounter (Signed)
NPSG ordered for patient.

## 2023-08-23 NOTE — Telephone Encounter (Signed)
-----   Message from Armanda Magic sent at 08/14/2023  6:31 PM EDT ----- Jamie Burke this patient tried a HST twice and had poor data - she needs an in lab PSG set up with ordering MD Dr. Duke Salvia

## 2023-08-24 ENCOUNTER — Other Ambulatory Visit: Payer: Self-pay | Admitting: Internal Medicine

## 2023-08-28 NOTE — Progress Notes (Signed)
[facility] PRE[facility] Weekly Session  Patient Details  Name: Jamie Burke MRN: [id number] Date of Birth: 02-20-1946 Age: 77 y.o. PCP: Deatra James, MD  Vitals:   08/28/23 1538  Weight: 218 lb 9.6 oz (99.2 kg)     [facility] Weekly seesion - 08/28/23 1500       [facility] "PREP" Location   [facility] "PREP" Location [name]      Weekly Session   Topic Discussed Importance of resistance training;Other ways to be active   CV activity: work up to 150 min/cardio a week; resistance strength training: start at twice/wk, work up to 3 times a week for 20-40 minutes   Minutes exercised this week 45 minutes    Classes attended to date 3             Jamie Burke 08/28/2023, 3:38 PM

## 2023-08-31 ENCOUNTER — Ambulatory Visit (HOSPITAL_BASED_OUTPATIENT_CLINIC_OR_DEPARTMENT_OTHER): Payer: Medicare Other

## 2023-09-11 DIAGNOSIS — Z03818 Encounter for observation for suspected exposure to other biological agents ruled out: Secondary | ICD-10-CM | POA: Diagnosis not present

## 2023-09-11 DIAGNOSIS — J069 Acute upper respiratory infection, unspecified: Secondary | ICD-10-CM | POA: Diagnosis not present

## 2023-09-14 ENCOUNTER — Ambulatory Visit: Payer: Medicare Other | Admitting: Internal Medicine

## 2023-09-15 ENCOUNTER — Ambulatory Visit
Payer: Medicare Other | Attending: Cardiovascular Disease | Admitting: Pharmacist Clinician (PhC)/ Clinical Pharmacy Specialist

## 2023-09-15 VITALS — BP 153/82 | HR 90

## 2023-09-15 DIAGNOSIS — I10 Essential (primary) hypertension: Secondary | ICD-10-CM

## 2023-09-15 NOTE — Patient Instructions (Addendum)
Follow up appointment: with Dr. Duke Salvia in 1 year.  Take your BP meds as follows: continue with hydrochlorothiazide three times per week.  Check your blood pressure at home daily (if able) and keep record of the readings.  Hypertension "High blood pressure"  Hypertension is often called "The Silent Killer." It rarely causes symptoms until it is extremely  high or has done damage to other organs in the body. For this reason, you should have your  blood pressure checked regularly by your physician. We will check your blood pressure  every time you see a provider at one of our offices.   Your blood pressure reading consists of two numbers. Ideally, blood pressure should be  below 120/80. The first ("top") number is called the systolic pressure. It measures the  pressure in your arteries as your heart beats. The second ("bottom") number is called the diastolic pressure. It measures the pressure in your arteries as the heart relaxes between beats.  The benefits of getting your blood pressure under control are enormous. A 10-point  reduction in systolic blood pressure can reduce your risk of stroke by 27% and heart failure by 28%  Your blood pressure goal is < 130/80  To check your pressure at home you will need to:  1. Sit up in a chair, with feet flat on the floor and back supported. Do not cross your ankles or legs. 2. Rest your left arm so that the cuff is about heart level. If the cuff goes on your upper arm,  then just relax the arm on the table, arm of the chair or your lap. If you have a wrist cuff, we  suggest relaxing your wrist against your chest (think of it as Pledging the Flag with the  wrong arm).  3. Place the cuff snugly around your arm, about 1 inch above the crook of your elbow. The  cords should be inside the groove of your elbow.  4. Sit quietly, with the cuff in place, for about 5 minutes. After that 5 minutes press the power  button to start a reading. 5. Do not  talk or move while the reading is taking place.  6. Record your readings on a sheet of paper. Although most cuffs have a memory, it is often  easier to see a pattern developing when the numbers are all in front of you.  7. You can repeat the reading after 1-3 minutes if it is recommended  Make sure your bladder is empty and you have not had caffeine or tobacco within the last 30 min  Always bring your blood pressure log with you to your appointments. If you have not brought your monitor in to be double checked for accuracy, please bring it to your next appointment.  You can find a list of quality blood pressure cuffs at validatebp.org

## 2023-09-15 NOTE — Progress Notes (Unsigned)
Office Visit    [name] Date of Encounter: 09/17/2023  Primary Care Provider:  Deatra James, MD Primary Cardiologist:  Chilton Si, MD  Chief Complaint    Hypertension  Significant Past Medical History   HLD 10/24 LDL 92 Intolerant to ezetimibe and atorvastatin, now trying rosuvastatin  OSA Encouraged to complete sleep study  Obesity  Referred to nutritionist, offered referral to [facility]  DM2 10/24 A1c 8.7, On humalog, Ozempic  CAD CAC 170 (77th percentile)    Allergies  Allergen Reactions   Atenolol Other (See Comments)   Atorvastatin Other (See Comments)   Canagliflozin Other (See Comments)   Liraglutide Other (See Comments)   Lisinopril Swelling   Lisinopril-Hydrochlorothiazide Other (See Comments)   Losartan Potassium-Hctz Other (See Comments)   Metformin Hcl Other (See Comments)   Sumatriptan Other (See Comments)    Very crazy dreams    Sulfa Antibiotics Rash    History of Present Illness    Jamie Burke is a 77 y.o. female patient of Dr Duke Salvia, in the office today for hypertension follow up.  She was seen by Dr. Duke Salvia in early October, and with a pressure of 152/82 was advised to take her HCTZ once daily, rather than 3 days per week.  Patient has continued to take just 3 days per week and brings home readings that are almost all WNL.  (She did not bring home meter for validation).    Blood Pressure Goal:  130/80  Current Medications:  hctz 25 mg qd  Previously tried:  atenolol, lisinopril, losartan  Family Hx:   mother and brother both had hypertension; son diabetic with hypertension (5 kids total, others healthy)  Social Hx:      Tobacco: no  Alcohol: no  Caffeine: one coffee per day, switched to decaf recently, occasional tea  Diet:  mostly home cooked meals,  has struggles to get diet regulated, mostly chicken, vegetables frozen   Exercise: exercise at [facility] - PREP program group of 6;   Home  BP readings: 26 home readings average 124/65  (range 112-148/52-71)  Only 5 systolic readings > 130)  Accessory Clinical Findings    Lab Results  Component Value Date   CREATININE 0.82 08/07/2023   BUN 8 08/07/2023   NA 140 08/07/2023   K 4.3 08/07/2023   CL 103 08/07/2023   CO2 22 08/07/2023   Lab Results  Component Value Date   ALT 11 08/07/2023   AST 15 08/07/2023   ALKPHOS 96 08/07/2023   BILITOT 0.3 08/07/2023   Lab Results  Component Value Date   HGBA1C 8.7 (A) 08/02/2023    Home Medications    Current Outpatient Medications  Medication Sig Dispense Refill   ACCU-CHEK GUIDE test strip USE AS INSTRUCTED TO TEST BLOOD  SUGAR 3 TIMES DAILY 300 strip 2   Accu-Chek Softclix Lancets lancets USE AS INSTRUCTED 3 TIMES  DAILY 200 each 4   Blood Glucose Monitoring Suppl (ACCU-CHEK GUIDE ME) w/Device KIT Use as directed 3 times daily to test blood sugar E11.65 1 kit 0   dicyclomine (BENTYL) 20 MG tablet  (Patient not taking: Reported on 07/28/2023)     ezetimibe (ZETIA) 10 MG tablet Take 1 tablet (10 mg total) by mouth daily. (Patient not taking: Reported on 06/22/2023) 90 tablet 3   hydrochlorothiazide (HYDRODIURIL) 25 MG tablet Take 1 tablet (25 mg total) by mouth daily. 90 tablet 3   Insulin Lispro Prot & Lispro (HUMALOG  MIX 75/25 KWIKPEN) (75-25) 100 UNIT/ML Kwikpen Inject 64 Units into the skin daily with breakfast AND 60 Units daily with supper. 120 mL 3   Insulin Pen Needle 32G X 4 MM MISC 1 Device by Does not apply route in the morning and at bedtime. 200 each 3   naproxen (NAPROSYN) 375 MG tablet Take 375 mg by mouth 2 (two) times daily as needed.     pantoprazole (PROTONIX) 40 MG tablet Take 40 mg by mouth daily as needed.     rosuvastatin (CRESTOR) 10 MG tablet Take 1 tablet (10 mg total) by mouth daily. 90 tablet 3   Semaglutide,0.25 or 0.5MG /DOS, (OZEMPIC, 0.25 OR 0.5 MG/DOSE,) 2 MG/3ML SOPN Inject 0.5 mg into the skin once a week. 9 mL 3   No current  facility-administered medications for this visit.     Assessment & Plan    HTN (hypertension) Assessment: BP is uncontrolled in office BP 153/82 mmHg;  above the goal (<130/80). Tolerates hydrochlorothiazide well without any side effects Denies SOB, palpitation, chest pain, headaches,or swelling Reiterated the importance of regular exercise and low salt diet   Plan:  Continue taking hydrochlorothiazide 25 mg three days per week Patient to keep record of BP readings with heart rate and report to [place] at the next visit Patient to follow up with Dr. Duke Salvia in 1 year  Labs ordered today:  none   Phillips Hay PharmD CPP [facility]  [address] [place], [place] [address] [phone]

## 2023-09-17 ENCOUNTER — Encounter: Payer: Self-pay | Admitting: Pharmacist Clinician (PhC)/ Clinical Pharmacy Specialist

## 2023-09-17 NOTE — Assessment & Plan Note (Signed)
Assessment: BP is uncontrolled in office BP 153/82 mmHg;  above the goal (<130/80). Tolerates hydrochlorothiazide well without any side effects Denies SOB, palpitation, chest pain, headaches,or swelling Reiterated the importance of regular exercise and low salt diet   Plan:  Continue taking hydrochlorothiazide 25 mg three days per week Patient to keep record of BP readings with heart rate and report to [place] at the next visit Patient to follow up with Dr. Duke Salvia in 1 year  Labs ordered today:  none

## 2023-09-18 NOTE — Progress Notes (Signed)
[facility] PRE[facility] Weekly Session  Patient Details  Name: Jamie Burke MRN: [id number] Date of Birth: [birth year] Age: 77 y.o. PCP: Deatra James, MD  Vitals:   09/18/23 1540  Weight: 218 lb 3.2 oz (99 kg)     [facility] Weekly seesion - 09/18/23 1500       [facility] "PREP" Location   [facility] "PREP[facility] Location [name]      Weekly Session   Topic Discussed Health habits   Limit added sugars to 24 gm for women, 36 gm for men; sugar demo   Minutes exercised this week 90 minutes    Classes attended to date 7             Lulie Hurd B Alixandria Friedt 09/18/2023, 3:41 PM

## 2023-09-25 ENCOUNTER — Telehealth: Payer: Self-pay

## 2023-09-25 DIAGNOSIS — J329 Chronic sinusitis, unspecified: Secondary | ICD-10-CM | POA: Diagnosis not present

## 2023-09-25 DIAGNOSIS — R112 Nausea with vomiting, unspecified: Secondary | ICD-10-CM | POA: Diagnosis not present

## 2023-09-25 DIAGNOSIS — R0981 Nasal congestion: Secondary | ICD-10-CM | POA: Diagnosis not present

## 2023-09-25 NOTE — Telephone Encounter (Signed)
**Note De-Identified Lavanya Roa Obfuscation** Per [facility] Provider portal: CPT Code 46962 (NPSG) Description Polysomnography; age 76 years or older, sleep staging with 4 or more additional parameters of sleep, attended by a technologist Inquiry summary Notification/Prior Authorization not required for this service.

## 2023-09-27 ENCOUNTER — Telehealth: Payer: Self-pay

## 2023-09-27 NOTE — Telephone Encounter (Signed)
She called to withdraw from program; has been sick, now having migraines; Attended 3 education sessions and 4 workout strength training sessions.

## 2023-10-06 ENCOUNTER — Telehealth: Payer: Self-pay

## 2023-10-06 ENCOUNTER — Encounter: Payer: Self-pay | Admitting: Internal Medicine

## 2023-10-06 DIAGNOSIS — Z9181 History of falling: Secondary | ICD-10-CM | POA: Diagnosis not present

## 2023-10-06 DIAGNOSIS — I10 Essential (primary) hypertension: Secondary | ICD-10-CM | POA: Diagnosis not present

## 2023-10-06 DIAGNOSIS — I25118 Atherosclerotic heart disease of native coronary artery with other forms of angina pectoris: Secondary | ICD-10-CM | POA: Diagnosis not present

## 2023-10-06 DIAGNOSIS — Z794 Long term (current) use of insulin: Secondary | ICD-10-CM | POA: Diagnosis not present

## 2023-10-06 DIAGNOSIS — E7800 Pure hypercholesterolemia, unspecified: Secondary | ICD-10-CM | POA: Diagnosis not present

## 2023-10-06 DIAGNOSIS — Z0000 Encounter for general adult medical examination without abnormal findings: Secondary | ICD-10-CM | POA: Diagnosis not present

## 2023-10-06 NOTE — Telephone Encounter (Signed)
Patient would like to know if a letter of medical necessity can be provide for the Ozempic to send to [facility].

## 2023-10-09 NOTE — Telephone Encounter (Signed)
Letter has been sent to [facility]

## 2023-10-16 ENCOUNTER — Other Ambulatory Visit: Payer: Self-pay | Admitting: Internal Medicine

## 2023-10-24 ENCOUNTER — Telehealth: Payer: Self-pay

## 2023-10-24 ENCOUNTER — Telehealth: Payer: Self-pay | Admitting: Internal Medicine

## 2023-10-24 NOTE — Telephone Encounter (Signed)
Patient came in to office today and brought a [employer] Patient Assistance Application to be completed.  The application is in Dr. Harvel Ricks folder in the front office.

## 2023-10-24 NOTE — Telephone Encounter (Signed)
Patient will bring by application for Ozempic.

## 2023-11-06 DIAGNOSIS — M1812 Unilateral primary osteoarthritis of first carpometacarpal joint, left hand: Secondary | ICD-10-CM | POA: Diagnosis not present

## 2023-11-06 DIAGNOSIS — M19131 Post-traumatic osteoarthritis, right wrist: Secondary | ICD-10-CM | POA: Diagnosis not present

## 2023-11-06 DIAGNOSIS — G8929 Other chronic pain: Secondary | ICD-10-CM | POA: Diagnosis not present

## 2023-11-06 DIAGNOSIS — M79642 Pain in left hand: Secondary | ICD-10-CM | POA: Diagnosis not present

## 2023-11-06 DIAGNOSIS — M25531 Pain in right wrist: Secondary | ICD-10-CM | POA: Diagnosis not present

## 2023-11-10 ENCOUNTER — Telehealth: Payer: Self-pay

## 2023-11-10 NOTE — Telephone Encounter (Signed)
Patient assistance application has been faxed.

## 2023-11-13 DIAGNOSIS — H25012 Cortical age-related cataract, left eye: Secondary | ICD-10-CM | POA: Diagnosis not present

## 2023-11-13 DIAGNOSIS — E119 Type 2 diabetes mellitus without complications: Secondary | ICD-10-CM | POA: Diagnosis not present

## 2023-11-13 DIAGNOSIS — H524 Presbyopia: Secondary | ICD-10-CM | POA: Diagnosis not present

## 2023-11-13 DIAGNOSIS — H5202 Hypermetropia, left eye: Secondary | ICD-10-CM | POA: Diagnosis not present

## 2023-11-13 DIAGNOSIS — H2512 Age-related nuclear cataract, left eye: Secondary | ICD-10-CM | POA: Diagnosis not present

## 2023-11-13 LAB — HM DIABETES EYE EXAM

## 2023-11-29 DIAGNOSIS — Z13820 Encounter for screening for osteoporosis: Secondary | ICD-10-CM | POA: Diagnosis not present

## 2023-11-29 DIAGNOSIS — D225 Melanocytic nevi of trunk: Secondary | ICD-10-CM | POA: Diagnosis not present

## 2023-11-29 DIAGNOSIS — Z1231 Encounter for screening mammogram for malignant neoplasm of breast: Secondary | ICD-10-CM | POA: Diagnosis not present

## 2023-11-29 DIAGNOSIS — E119 Type 2 diabetes mellitus without complications: Secondary | ICD-10-CM | POA: Diagnosis not present

## 2023-12-02 DIAGNOSIS — R5383 Other fatigue: Secondary | ICD-10-CM | POA: Diagnosis not present

## 2023-12-02 DIAGNOSIS — R82998 Other abnormal findings in urine: Secondary | ICD-10-CM | POA: Diagnosis not present

## 2023-12-02 DIAGNOSIS — R059 Cough, unspecified: Secondary | ICD-10-CM | POA: Diagnosis not present

## 2023-12-02 DIAGNOSIS — R0981 Nasal congestion: Secondary | ICD-10-CM | POA: Diagnosis not present

## 2023-12-05 ENCOUNTER — Ambulatory Visit: Payer: Medicare Other | Admitting: Internal Medicine

## 2023-12-05 NOTE — Progress Notes (Deleted)
 Name: Jamie Burke  Age/ Sex: 78 y.o., female   MRN/ DOB: [id number], 06-02-1946     PCP: Deatra James, MD   Reason for Endocrinology Evaluation: Type 2 Diabetes Mellitus  Initial Endocrine Consultative Visit: 12/25/2019    PATIENT IDENTIFIER: Jamie Burke is a 78 y.o. female with a past medical history of T2DM and HTN. The patient has followed with Endocrinology clinic since 12/25/2019 for consultative assistance with management of her diabetes.  DIABETIC HISTORY:  Jamie Burke was diagnosed with DM > 20 yr ago. She has been on various  glycemic agents (metformin- weakness in the legs and incontinence, invokana - weakness in the legs. Victoza , Trulicity , she does not recall any side effects ). Her hemoglobin A1c has ranged from 8.6% in 2012, peaking at 8.9% in 2021.  On her initial visit to our clinic she had an A1c of 9.2%, she was on insulin mix and had metformin but she was not taking it due to GI effects.   She had tried Rybelsus twice the first time she stopped it in October 2022 due to leg pains, the second time she stopped it in May 2023 due to lip swelling   By April 2023 and due to persistent hyperglycemia we will switch Humalog mix to basal/prandial, by May 2023 she called back stating lip swelling with Toujeo and Rybelsus and persistent hyperglycemia.  She returned to using insulin mix and stopped Rybelsus   She did not tolerate Trulicity due to abdominal pain 01/2023  Started Ozempic 07/2023  SUBJECTIVE:   During the last visit (08/02/2023): A1c 8.6 %.      Today (12/05/2023): Jamie Burke is here for a follow up on diabetes management.  She checks her blood sugars 2 times daily. She  does not have hypoglycemic episodes    Denies nausea or vomiting  Continues with chronic constipation and takes laxatives /stool softners   HOME DIABETES REGIMEN:  Humalog Mix 60 units with Breakfast and 60 units with Supper  Ozempic 0.5 mg weekly     METER DOWNLOAD SUMMARY:  n/a    DIABETIC COMPLICATIONS: Microvascular complications:   Denies: CKD, retinopathy , neuropathy  Last eye exam: 02/2023   Macrovascular complications:    Denies: CAD, PVD, CVA     HISTORY:  Past Medical History:  Past Medical History:  Diagnosis Date   Diabetes mellitus    Dysplastic nevi    Gastroparesis    GERD (gastroesophageal reflux disease)    Hypertension    Melanoma (HCC)    Snoring 03/28/2022   Tachycardia 03/28/2022   Past Surgical History:  Past Surgical History:  Procedure Laterality Date   ABDOMINAL HYSTERECTOMY     CARPAL TUNNEL RELEASE     BL   TRIGGER FINGER RELEASE     Social History:  reports that she has never smoked. She has never used smokeless tobacco. She reports that she does not drink alcohol and does not use drugs. Family History:  Family History  Problem Relation Age of Onset   Diabetes Mother    Heart disease Mother    Hypertension Mother    Stroke Mother    Hypertension Father    Stroke Father    Cancer Brother 39       Lung Cancer   Kidney disease Brother    Heart disease Maternal Aunt      HOME MEDICATIONS: Allergies as of 12/05/2023       Reactions   Atenolol Other (See  Comments)   Atorvastatin Other (See Comments)   Canagliflozin Other (See Comments)   Liraglutide Other (See Comments)   Lisinopril Swelling   Lisinopril-hydrochlorothiazide Other (See Comments)   Losartan Potassium-hctz Other (See Comments)   Metformin Hcl Other (See Comments)   Sumatriptan Other (See Comments)   Very crazy dreams   Sulfa Antibiotics Rash        Medication List        Accurate as of December 05, 2023  6:54 AM. If you have any questions, ask your nurse or doctor.          Accu-Chek Guide Me w/Device Kit Use as directed 3 times daily to test blood sugar E11.65   Accu-Chek Guide test strip Generic drug: glucose blood USE AS INSTRUCTED TO TEST BLOOD  SUGAR 3 TIMES DAILY   Accu-Chek Softclix Lancets lancets USE AS  INSTRUCTED 3 TIMES  DAILY   dicyclomine 20 MG tablet Commonly known as: BENTYL   ezetimibe 10 MG tablet Commonly known as: ZETIA Take 1 tablet (10 mg total) by mouth daily.   hydrochlorothiazide 25 MG tablet Commonly known as: HYDRODIURIL Take 1 tablet (25 mg total) by mouth daily.   Insulin Lispro Prot & Lispro (75-25) 100 UNIT/ML Kwikpen Commonly known as: HumaLOG Mix 75/25 KwikPen Inject 56 Units into the skin in the morning and at bedtime.   Insulin Pen Needle 32G X 4 MM Misc 1 Device by Does not apply route in the morning and at bedtime.   naproxen 375 MG tablet Commonly known as: NAPROSYN Take 375 mg by mouth 2 (two) times daily as needed.   Ozempic (0.25 or 0.5 MG/DOSE) 2 MG/3ML Sopn Generic drug: Semaglutide(0.25 or 0.5MG /DOS) Inject 0.5 mg into the skin once a week.   pantoprazole 40 MG tablet Commonly known as: PROTONIX Take 40 mg by mouth daily as needed.   rosuvastatin 10 MG tablet Commonly known as: CRESTOR Take 1 tablet (10 mg total) by mouth daily.         OBJECTIVE:   Vital Signs: There were no vitals taken for this visit.  Wt Readings from Last 3 Encounters:  09/18/23 218 lb 3.2 oz (99 kg)  08/28/23 218 lb 9.6 oz (99.2 kg)  08/15/23 219 lb 3.2 oz (99.4 kg)     Exam: General: Pt appears well and is in NAD  Lungs: Clear with good BS bilat   Heart: RRR   Extremities: No pretibial edema.   Neuro: MS is good with appropriate affect, pt is alert and Ox3    DM foot exam: 11/11/2022   The skin of the feet is intact without sores or ulcerations. The pedal pulses are 2+ on right and 2+ on left. The sensation is decreased at the heels to a screening 5.07, 10 gram monofilament bilaterally     DATA REVIEWED:  Lab Results  Component Value Date   HGBA1C 8.7 (A) 08/02/2023   HGBA1C 8.6 (A) 04/17/2023   HGBA1C 8.9 (A) 11/11/2022    Latest Reference Range & Units 08/02/23 14:05  Sodium 135 - 145 mEq/L 138  Potassium 3.5 - 5.1 mEq/L 3.8   Chloride 96 - 112 mEq/L 102  CO2 19 - 32 mEq/L 27  Glucose 70 - 99 mg/dL 161 (H)  BUN 6 - 23 mg/dL 10  Creatinine 0.96 - 0.45 mg/dL 4.09  Calcium 8.4 - 81.1 mg/dL 9.9  GFR >91.47 mL/min 73.79    Latest Reference Range & Units 08/02/23 14:05  Creatinine,U mg/dL 82.9  Microalb, Ur  0.0 - 1.9 mg/dL <1.6  MICROALB/CREAT RATIO 0.0 - 30.0 mg/g 1.5           ASSESSMENT / PLAN / RECOMMENDATIONS:   1) Type 2 Diabetes Mellitus, Poorly controlled, With Neuropathic complications - Most recent A1c of 8.6 %. Goal A1c <7.0 %.     -Patient continues with worsening glycemic control, she continues to use less insulin than prescribed, the patient tells me that her glucose looks "good" and she felt that that was the appropriate dose for her -I did explain to the patient that her glucose readings have to be optimal for a total of 90 days before this would reflect on the A1c, and seeing 1 reading occasionally that looks good is not indication to reduce the amount of insulin  - She is intolerant to metformin, as well as invokana -Intolerant to Rybelsus Rybelsus due to leg pains , and the second time lip swelling -I have attempted to switch her from insulin mix to multiple daily injections of insulin but this was not a successful experience for her -She did not tolerate Trulicity due to abdominal pain - She would like to postpone dexcom at this time  -I have recommended Ozempic, caution against GI side effects    MEDICATIONS: Continue Humalog Mix 60 units with breakfast, 60 units with supper Start Ozempic 0.25 mg weekly for 6 weeks then increase to 0.5 mg weekly   EDUCATION / INSTRUCTIONS: BG monitoring instructions: Patient is instructed to check her blood sugars 3 times a day, before meals. Call  Endocrinology clinic if: BG persistently < 70  I reviewed the Rule of 15 for the treatment of hypoglycemia in detail with the patient. Literature supplied.    2) Diabetic complications:    Eye: Does not have known diabetic retinopathy.  Neuro/ Feet: Does have known diabetic peripheral neuropathy based on her  Exam  Renal: Patient does not have known baseline CKD. She is not on an ACEI/ARB at present.C She is allergic to lisinopril     F/U in 4  months    Signed electronically by: Lyndle Herrlich, MD  [facility] End[facility]  [facility] [address] [place] [place], [place] [address] Phone: [phone] FAX: [phone]   CC: Deatra James, MD [address] [place] [address] Phone: [phone]  Fax: [phone]  Return to Endocrinology clinic as below: Future Appointments  Date Time Provider Department Center  12/05/2023  8:30 AM Ladon Heney, Konrad Dolores, MD [facility] None

## 2023-12-11 ENCOUNTER — Ambulatory Visit: Payer: Medicare Other | Admitting: Internal Medicine

## 2023-12-11 ENCOUNTER — Telehealth: Payer: Self-pay

## 2023-12-11 ENCOUNTER — Encounter: Payer: Self-pay | Admitting: Internal Medicine

## 2023-12-11 VITALS — BP 124/76 | HR 100 | Ht 64.0 in | Wt 219.0 lb

## 2023-12-11 DIAGNOSIS — Z794 Long term (current) use of insulin: Secondary | ICD-10-CM | POA: Diagnosis not present

## 2023-12-11 DIAGNOSIS — E785 Hyperlipidemia, unspecified: Secondary | ICD-10-CM

## 2023-12-11 DIAGNOSIS — E1165 Type 2 diabetes mellitus with hyperglycemia: Secondary | ICD-10-CM | POA: Diagnosis not present

## 2023-12-11 LAB — POCT GLYCOSYLATED HEMOGLOBIN (HGB A1C): Hemoglobin A1C: 7.9 % — AB (ref 4.0–5.6)

## 2023-12-11 NOTE — Telephone Encounter (Signed)
Medication Samples have been provided to the patient.  Drug name: Ozempic        Strength: 0.25mg /0.5mg          Qty: 1 pen   LOT: [id number]  Exp.Date: 07/23/2025  Dosing instructions: Inject once weekly   The patient has been instructed regarding the correct time, dose, and frequency of taking this medication, including desired effects and most common side effects.   Jamie Burke [name] 2:42 PM 12/11/2023

## 2023-12-11 NOTE — Patient Instructions (Addendum)
-   Continue Humalog Mix 60 units with Breakfast and 60 units with Supper - Start Ozempic 0.25 mg weekly for 6 weeks, than increase to 0.5 mg weekly    HOW TO TREAT LOW BLOOD SUGARS (Blood sugar LESS THAN 70 MG/DL) Please follow the RULE OF 15 for the treatment of hypoglycemia treatment (when your (blood sugars are less than 70 mg/dL)   STEP 1: Take 15 grams of carbohydrates when your blood sugar is low, which includes:  3-4 GLUCOSE TABS  OR 3-4 OZ OF JUICE OR REGULAR SODA OR ONE TUBE OF GLUCOSE GEL    STEP 2: RECHECK blood sugar in 15 MINUTES STEP 3: If your blood sugar is still low at the 15 minute recheck --> then, go back to STEP 1 and treat AGAIN with another 15 grams of carbohydrates.

## 2023-12-11 NOTE — Progress Notes (Signed)
Name: Jamie Burke  Age/ Sex: 78 y.o., female   MRN/ DOB: [id number], [birth year]     PCP: Deatra James, MD   Reason for Endocrinology Evaluation: Type 2 Diabetes Mellitus  Initial Endocrine Consultative Visit: 12/25/2019    PATIENT IDENTIFIER: Jamie Burke is a 78 y.o. female with a past medical history of T2DM and HTN. The patient has followed with Endocrinology clinic since 12/25/2019 for consultative assistance with management of her diabetes.  DIABETIC HISTORY:  Jamie Burke was diagnosed with DM > 20 yr ago. She has been on various  glycemic agents (metformin- weakness in the legs and incontinence, invokana - weakness in the legs. Victoza , Trulicity , she does not recall any side effects ). Her hemoglobin A1c has ranged from 8.6% in 2012, peaking at 8.9% in 2021.  On her initial visit to our clinic she had an A1c of 9.2%, she was on insulin mix and had metformin but she was not taking it due to GI effects.   She had tried Rybelsus twice the first time she stopped it in October 2022 due to leg pains, the second time she stopped it in May 2023 due to lip swelling   By April 2023 and due to persistent hyperglycemia we will switch Humalog mix to basal/prandial, by May 2023 she called back stating lip swelling with Toujeo and Rybelsus and persistent hyperglycemia.  She returned to using insulin mix and stopped Rybelsus   She did not tolerate Trulicity due to abdominal pain 01/2023  Started Ozempic 07/2023  SUBJECTIVE:   During the last visit (08/02/2023): A1c 8.6 %.      Today (12/11/2023): Jamie Burke is here for a follow up on diabetes management.  She checks her blood sugars 2 times daily. She  does not have hypoglycemic episodes    She had nausea recently , that she attributes to meloxicam Continues with chronic constipation and takes laxatives /stool softners   HOME DIABETES REGIMEN:  Humalog Mix 60 units with Breakfast and 60 units with Supper  Ozempic 0.5 mg weekly -  not taking     METER DOWNLOAD SUMMARY:   108- 300 mg/dL     DIABETIC COMPLICATIONS: Microvascular complications:   Denies: CKD, retinopathy , neuropathy  Last eye exam: 02/2023   Macrovascular complications:    Denies: CAD, PVD, CVA     HISTORY:  Past Medical History:  Past Medical History:  Diagnosis Date   Diabetes mellitus    Dysplastic nevi    Gastroparesis    GERD (gastroesophageal reflux disease)    Hypertension    Melanoma (HCC)    Snoring 03/28/2022   Tachycardia 03/28/2022   Past Surgical History:  Past Surgical History:  Procedure Laterality Date   ABDOMINAL HYSTERECTOMY     CARPAL TUNNEL RELEASE     BL   TRIGGER FINGER RELEASE     Social History:  reports that she has never smoked. She has never used smokeless tobacco. She reports that she does not drink alcohol and does not use drugs. Family History:  Family History  Problem Relation Age of Onset   Diabetes Mother    Heart disease Mother    Hypertension Mother    Stroke Mother    Hypertension Father    Stroke Father    Cancer Brother 24       Lung Cancer   Kidney disease Brother    Heart disease Maternal Aunt      HOME MEDICATIONS: Allergies as of  12/11/2023       Reactions   Atenolol Other (See Comments)   Atorvastatin Other (See Comments)   Canagliflozin Other (See Comments)   Liraglutide Other (See Comments)   Lisinopril Swelling   Lisinopril-hydrochlorothiazide Other (See Comments)   Losartan Potassium-hctz Other (See Comments)   Metformin Hcl Other (See Comments)   Sumatriptan Other (See Comments)   Very crazy dreams   Sulfa Antibiotics Rash        Medication List        Accurate as of December 11, 2023  2:10 PM. If you have any questions, ask your nurse or doctor.          Accu-Chek Guide Me w/Device Kit Use as directed 3 times daily to test blood sugar E11.65   Accu-Chek Guide test strip Generic drug: glucose blood USE AS INSTRUCTED TO TEST BLOOD  SUGAR 3  TIMES DAILY   Accu-Chek Softclix Lancets lancets USE AS INSTRUCTED 3 TIMES  DAILY   dicyclomine 20 MG tablet Commonly known as: BENTYL   ezetimibe 10 MG tablet Commonly known as: ZETIA Take 1 tablet (10 mg total) by mouth daily.   hydrochlorothiazide 25 MG tablet Commonly known as: HYDRODIURIL Take 1 tablet (25 mg total) by mouth daily.   Insulin Lispro Prot & Lispro (75-25) 100 UNIT/ML Kwikpen Commonly known as: HumaLOG Mix 75/25 KwikPen Inject 56 Units into the skin in the morning and at bedtime.   Insulin Pen Needle 32G X 4 MM Misc 1 Device by Does not apply route in the morning and at bedtime.   meloxicam 15 MG tablet Commonly known as: MOBIC Take 1 tablet by mouth daily.   naproxen 375 MG tablet Commonly known as: NAPROSYN Take 375 mg by mouth 2 (two) times daily as needed.   Ozempic (0.25 or 0.5 MG/DOSE) 2 MG/3ML Sopn Generic drug: Semaglutide(0.25 or 0.5MG /DOS) Inject 0.5 mg into the skin once a week.   pantoprazole 40 MG tablet Commonly known as: PROTONIX Take 1 tablet by mouth daily as needed.   pantoprazole 40 MG tablet Commonly known as: PROTONIX Take 40 mg by mouth daily as needed.   rosuvastatin 10 MG tablet Commonly known as: CRESTOR Take 1 tablet (10 mg total) by mouth daily.         OBJECTIVE:   Vital Signs: BP 124/76 (BP Location: Left Arm, Patient Position: Sitting, Cuff Size: Normal)   Pulse 100   Ht 5\' 4"  (1.626 m)   Wt 219 lb (99.3 kg)   SpO2 97%   BMI 37.59 kg/m   Wt Readings from Last 3 Encounters:  12/11/23 219 lb (99.3 kg)  09/18/23 218 lb 3.2 oz (99 kg)  08/28/23 218 lb 9.6 oz (99.2 kg)     Exam: General: Pt appears well and is in NAD  Lungs: Clear with good BS bilat   Heart: RRR   Extremities: No pretibial edema.   Neuro: MS is good with appropriate affect, pt is alert and Ox3    DM foot exam: 11/11/2022   The skin of the feet is intact without sores or ulcerations. The pedal pulses are 2+ on right and 2+ on  left. The sensation is decreased at the heels to a screening 5.07, 10 gram monofilament bilaterally     DATA REVIEWED:  Lab Results  Component Value Date   HGBA1C 8.7 (A) 08/02/2023   HGBA1C 8.6 (A) 04/17/2023   HGBA1C 8.9 (A) 11/11/2022    Latest Reference Range & Units 08/02/23 14:05  Sodium  135 - 145 mEq/L 138  Potassium 3.5 - 5.1 mEq/L 3.8  Chloride 96 - 112 mEq/L 102  CO2 19 - 32 mEq/L 27  Glucose 70 - 99 mg/dL 161 (H)  BUN 6 - 23 mg/dL 10  Creatinine 0.96 - 0.45 mg/dL 4.09  Calcium 8.4 - 81.1 mg/dL 9.9  GFR >91.47 mL/min 73.79    Latest Reference Range & Units 08/02/23 14:05  Creatinine,U mg/dL 82.9  Microalb, Ur 0.0 - 1.9 mg/dL <5.6  MICROALB/CREAT RATIO 0.0 - 30.0 mg/g 1.5    Latest Reference Range & Units 08/07/23 08:42  Total CHOL/HDL Ratio 0.0 - 4.4 ratio 4.2  Cholesterol, Total 100 - 199 mg/dL 213  HDL Cholesterol >08 mg/dL 35 (L)  Triglycerides 0 - 149 mg/dL 657  VLDL Cholesterol Cal 5 - 40 mg/dL 20  LDL Chol Calc (NIH) 0 - 99 mg/dL 92  (L): Data is abnormally low        ASSESSMENT / PLAN / RECOMMENDATIONS:   1) Type 2 Diabetes Mellitus, Sub-optimally  controlled, With Neuropathic complications - Most recent A1c of 7.9 %. Goal A1c <7.0 %.      -A1c is trending down -She could not afford Ozempic, but she was recently approved for patient assistance, she was provided with #1 samples pen of Ozempic to restart -Patient advised to decrease insulin by 4 units with BG readings less than 100 Mg/DL to prevent hypoglycemia - She is intolerant to metformin, as well as invokana -Intolerant to Rybelsus Rybelsus due to leg pains , and the second time lip swelling -I have attempted to switch her from insulin mix to multiple daily injections of insulin but this was not a successful experience for her -She did not tolerate Trulicity due to abdominal pain -She has declined Dexcom in the past   MEDICATIONS: Continue Humalog Mix 60 units with breakfast, 60 units with  supper Start Ozempic 0.25 mg weekly for 6 weeks then increase to 0.5 mg weekly   EDUCATION / INSTRUCTIONS: BG monitoring instructions: Patient is instructed to check her blood sugars 3 times a day, before meals. Call [facility] if: BG persistently < 70  I reviewed the Rule of 15 for the treatment of hypoglycemia in detail with the patient. Literature supplied.    2) Diabetic complications:   Eye: Does not have known diabetic retinopathy.  Neuro/ Feet: Does have known diabetic peripheral neuropathy based on her  Exam  Renal: Patient does not have known baseline CKD. She is not on an ACEI/ARB at present.C She is allergic to lisinopril   3) Dyslipidemia :  - She has not been taking zetia nor rosuvasttain - Discussed cardiovascular benefits of statin therapy - Pt advised to restart rosuvastatin but may hold off on Zetia at this time  - She is starting to exercise    F/U in 4  months    Signed electronically by: Lyndle Herrlich, MD  [facility]  [facility] [address] [place], [place] [address] Phone: [phone] FAX: [phone]   CC: Deatra James, MD [address] [place] [address] Phone: [phone]  Fax: [phone]  Return to Endocrinology clinic as below: No future appointments.

## 2024-01-02 NOTE — Telephone Encounter (Signed)
Patient came in to office today and picked up 4 boxes of patient assistance Ozempic.

## 2024-02-06 ENCOUNTER — Ambulatory Visit: Admitting: Podiatry

## 2024-02-06 ENCOUNTER — Encounter: Payer: Self-pay | Admitting: Podiatry

## 2024-02-06 VITALS — Ht 64.0 in | Wt 219.0 lb

## 2024-02-06 DIAGNOSIS — B351 Tinea unguium: Secondary | ICD-10-CM

## 2024-02-06 DIAGNOSIS — M2012 Hallux valgus (acquired), left foot: Secondary | ICD-10-CM

## 2024-02-06 DIAGNOSIS — E119 Type 2 diabetes mellitus without complications: Secondary | ICD-10-CM | POA: Diagnosis not present

## 2024-02-06 DIAGNOSIS — Z794 Long term (current) use of insulin: Secondary | ICD-10-CM

## 2024-02-06 DIAGNOSIS — M79675 Pain in left toe(s): Secondary | ICD-10-CM

## 2024-02-06 DIAGNOSIS — M79674 Pain in right toe(s): Secondary | ICD-10-CM | POA: Diagnosis not present

## 2024-02-06 DIAGNOSIS — M2011 Hallux valgus (acquired), right foot: Secondary | ICD-10-CM | POA: Diagnosis not present

## 2024-02-06 NOTE — Progress Notes (Signed)
 ANNUAL DIABETIC FOOT EXAM  Subjective: Jamie Burke presents today for annual diabetic foot exam.  Chief Complaint  Patient presents with   Nail Problem    Pt is here for Jamie Burke unsure of last A1C PCP is Dr Paulene Boron and LOV was in January.   Patient confirms h/o diabetes.  Patient denies any h/o foot wounds.  Sun, Vyvyan, MD is patient's PCP.  Past Medical History:  Diagnosis Date   Diabetes mellitus    Dysplastic nevi    Gastroparesis    GERD (gastroesophageal reflux disease)    Hypertension    Melanoma (HCC)    Snoring 03/28/2022   Tachycardia 03/28/2022   Patient Active Problem List   Diagnosis Date Noted   Snoring 03/28/2022   Tachycardia 03/28/2022   Polyneuropathy associated with underlying disease (HCC) 07/28/2021   Type 2 diabetes mellitus with diabetic polyneuropathy, with long-term current use of insulin  (HCC) 12/25/2019   Dyslipidemia 12/25/2019   Diabetes mellitus (HCC) 02/27/2019   Insomnia 02/27/2019   Atypical chest pain 08/20/2018   RBBB 08/20/2018   Bilateral sensorineural hearing loss 01/13/2016   Subjective tinnitus of left ear 01/13/2016   Rotator cuff injury 05/23/2011   Seasonal allergies 05/23/2011   Lesion of nose 05/23/2011   Seasonal allergic rhinitis 05/23/2011   Hyperlipidemia with target low density lipoprotein (LDL) cholesterol less than 100 mg/dL 16/07/9603   Obesity 54/06/8118   HTN (hypertension) 03/15/2011   Diabetes mellitus, type II, insulin  dependent (HCC) 03/15/2011   Past Surgical History:  Procedure Laterality Date   ABDOMINAL HYSTERECTOMY     CARPAL TUNNEL RELEASE     BL   TRIGGER FINGER RELEASE     Current Outpatient Medications on File Prior to Visit  Medication Sig Dispense Refill   ACCU-CHEK GUIDE test strip USE AS INSTRUCTED TO TEST BLOOD  SUGAR 3 TIMES DAILY 300 strip 2   Accu-Chek Softclix Lancets lancets USE AS INSTRUCTED 3 TIMES  DAILY 200 each 4   Blood Glucose Monitoring Suppl (ACCU-CHEK GUIDE ME) w/Device KIT  Use as directed 3 times daily to test blood sugar E11.65 1 kit 0   dicyclomine (BENTYL) 20 MG tablet      hydrochlorothiazide  (HYDRODIURIL ) 25 MG tablet Take 1 tablet (25 mg total) by mouth daily. 90 tablet 3   Insulin  Lispro Prot & Lispro (HUMALOG  MIX 75/25 KWIKPEN) (75-25) 100 UNIT/ML Kwikpen Inject 56 Units into the skin in the morning and at bedtime. 120 mL 3   Insulin  Pen Needle 32G X 4 MM MISC 1 Device by Does not apply route in the morning and at bedtime. 200 each 3   meloxicam (MOBIC) 15 MG tablet Take 1 tablet by mouth daily.     naproxen (NAPROSYN) 375 MG tablet Take 375 mg by mouth 2 (two) times daily as needed.     pantoprazole (PROTONIX) 40 MG tablet Take 40 mg by mouth daily as needed.     pantoprazole (PROTONIX) 40 MG tablet Take 1 tablet by mouth daily as needed.     Semaglutide ,0.25 or 0.5MG /DOS, (OZEMPIC , 0.25 OR 0.5 MG/DOSE,) 2 MG/3ML SOPN Inject 0.5 mg into the skin once a week. 9 mL 3   rosuvastatin  (CRESTOR ) 10 MG tablet Take 1 tablet (10 mg total) by mouth daily. 90 tablet 3   No current facility-administered medications on file prior to visit.    Allergies  Allergen Reactions   Atenolol Other (See Comments)   Atorvastatin  Other (See Comments)   Canagliflozin Other (See Comments)  Liraglutide Other (See Comments)   Lisinopril  Swelling   Lisinopril -Hydrochlorothiazide  Other (See Comments)   Losartan Potassium-Hctz Other (See Comments)   Metformin  Hcl Other (See Comments)   Sumatriptan  Other (See Comments)    Very crazy dreams    Sulfa Antibiotics Rash   Social History   Occupational History   Not on file  Tobacco Use   Smoking status: Never   Smokeless tobacco: Never  Vaping Use   Vaping status: Never Used  Substance and Sexual Activity   Alcohol use: No   Drug use: No   Sexual activity: Never    Birth control/protection: Surgical   Family History  Problem Relation Age of Onset   Diabetes Mother    Heart disease Mother    Hypertension Mother     Stroke Mother    Hypertension Father    Stroke Father    Cancer Brother 41       Lung Cancer   Kidney disease Brother    Heart disease Maternal Aunt     There is no immunization history on file for this patient.   Review of Systems: Negative except as noted in the HPI.   Objective: There were no vitals filed for this visit.  Jamie Burke is a pleasant 78 y.o. female in NAD. AAO X 3.  Diabetic foot exam was performed with the following findings:   Normal sensation of 10g monofilament Intact posterior tibialis and dorsalis pedis pulses Vascular Examination: Capillary refill time immediate b/l. Vascular status intact b/l with palpable pedal pulses. Pedal hair present b/l. No pain with calf compression b/l. Skin temperature gradient WNL b/l. No cyanosis or clubbing b/l. No ischemia or gangrene noted b/l. No edema noted b/l LE.  Neurological Examination: Sensation grossly intact b/l with 10 gram monofilament. Vibratory sensation intact b/l.   Dermatological Examination: Pedal skin with normal turgor, texture and tone b/l.  No open wounds. No interdigital macerations.   Toenails 1-5 b/l thick, discolored, elongated with subungual debris and pain on dorsal palpation.   No hyperkeratotic nor porokeratotic lesions present on today's visit.  Musculoskeletal Examination: Muscle strength 5/5 to all lower extremity muscle groups bilaterally. HAV with bunion deformity noted b/l LE. Patient ambulates independent of any assistive aids.  Radiographs: None     Lab Results  Component Value Date   HGBA1C 7.9 (A) 12/11/2023   ADA Risk Categorization: Low Risk :  Patient has all of the following: Intact protective sensation No prior foot ulcer  No severe deformity Pedal pulses present  Assessment: 1. Pain due to onychomycosis of toenails of both feet   2. Hallux valgus, acquired, bilateral   3. Diabetes mellitus, type II, insulin  dependent (HCC)   4. Encounter for diabetic foot  exam (HCC)     Plan: Diabetic foot examination performed today. All patient's and/or POA's questions/concerns addressed on today's visit. Toenails 1-5 debrided in length and girth without incident. Continue foot and shoe inspections daily. Monitor blood glucose per PCP/Endocrinologist's recommendations. Continue soft, supportive shoe gear daily. Report any pedal injuries to medical professional. Call office if there are any questions/concerns. -Patient/POA to call should there be question/concern in the interim. Return in about 3 months (around 05/07/2024).  Luella Sager, DPM      [facility] LOCATION: [address]  [place], [place] [address]                   Office [phone]   [facility] LOCATION: [address] [place], [place] [address] Office [phone]

## 2024-02-06 NOTE — Patient Instructions (Signed)

## 2024-02-10 ENCOUNTER — Encounter: Payer: Self-pay | Admitting: Podiatry

## 2024-02-14 DIAGNOSIS — M19031 Primary osteoarthritis, right wrist: Secondary | ICD-10-CM | POA: Diagnosis not present

## 2024-02-19 DIAGNOSIS — M25531 Pain in right wrist: Secondary | ICD-10-CM | POA: Diagnosis not present

## 2024-03-13 DIAGNOSIS — E119 Type 2 diabetes mellitus without complications: Secondary | ICD-10-CM | POA: Diagnosis not present

## 2024-04-09 ENCOUNTER — Encounter: Payer: Self-pay | Admitting: Internal Medicine

## 2024-04-09 ENCOUNTER — Ambulatory Visit: Payer: Medicare Other | Admitting: Internal Medicine

## 2024-04-09 VITALS — BP 122/80 | HR 91 | Ht 64.0 in | Wt 225.0 lb

## 2024-04-09 DIAGNOSIS — E1142 Type 2 diabetes mellitus with diabetic polyneuropathy: Secondary | ICD-10-CM

## 2024-04-09 DIAGNOSIS — E1165 Type 2 diabetes mellitus with hyperglycemia: Secondary | ICD-10-CM

## 2024-04-09 DIAGNOSIS — E785 Hyperlipidemia, unspecified: Secondary | ICD-10-CM

## 2024-04-09 DIAGNOSIS — Z794 Long term (current) use of insulin: Secondary | ICD-10-CM

## 2024-04-09 LAB — POCT GLYCOSYLATED HEMOGLOBIN (HGB A1C): Hemoglobin A1C: 8.8 % — AB (ref 4.0–5.6)

## 2024-04-09 MED ORDER — DEXCOM G7 SENSOR MISC: 1.0000 | 3 refills | Status: DC

## 2024-04-09 MED ORDER — INSULIN LISPRO PROT & LISPRO (75-25 MIX) 100 UNIT/ML KWIKPEN: PEN_INJECTOR | SUBCUTANEOUS | 11 refills | Status: DC

## 2024-04-09 MED ORDER — INSULIN PEN NEEDLE 32G X 4 MM MISC: 1.0000 | Freq: Two times a day (BID) | 3 refills | Status: DC

## 2024-04-09 NOTE — Patient Instructions (Signed)
-   Continue Humalog  Mix 66 units with Breakfast and 60 units with Supper   HOW TO TREAT LOW BLOOD SUGARS (Blood sugar LESS THAN 70 MG/DL) Please follow the RULE OF 15 for the treatment of hypoglycemia treatment (when your (blood sugars are less than 70 mg/dL)   STEP 1: Take 15 grams of carbohydrates when your blood sugar is low, which includes:  3-4 GLUCOSE TABS  OR 3-4 OZ OF JUICE OR REGULAR SODA OR ONE TUBE OF GLUCOSE GEL    STEP 2: RECHECK blood sugar in 15 MINUTES STEP 3: If your blood sugar is still low at the 15 minute recheck --> then, go back to STEP 1 and treat AGAIN with another 15 grams of carbohydrates.

## 2024-04-09 NOTE — Progress Notes (Signed)
 Name: Jamie Burke  Age/ Sex: 78 y.o., female   MRN/ DOB: [id number], 30-Sep-1946     PCP: Sun, Vyvyan, MD   Reason for Endocrinology Evaluation: Type 2 Diabetes Mellitus  Initial Endocrine Consultative Visit: 12/25/2019    PATIENT IDENTIFIER: Jamie Burke is a 78 y.o. female with a past medical history of T2DM and HTN. The patient has followed with Endocrinology clinic since 12/25/2019 for consultative assistance with management of her diabetes.  DIABETIC HISTORY:  Jamie Burke was diagnosed with DM > 20 yr ago. She has been on various  glycemic agents (metformin - weakness in the legs and incontinence, invokana - weakness in the legs. Victoza , Trulicity  , she does not recall any side effects ). Her hemoglobin A1c has ranged from 8.6% in 2012, peaking at 8.9% in 2021.  On her initial visit to our clinic she had an A1c of 9.2%, she was on insulin  mix and had metformin  but she was not taking it due to GI effects.   She had tried Rybelsus  twice the first time she stopped it in October 2022 due to leg pains, the second time she stopped it in May 2023 due to lip swelling   By April 2023 and due to persistent hyperglycemia we will switch Humalog  mix to basal/prandial, by May 2023 she called back stating lip swelling with Toujeo  and Rybelsus  and persistent hyperglycemia.  She returned to using insulin  mix and stopped Rybelsus    She did not tolerate Trulicity  due to abdominal pain 01/2023  Started Ozempic  07/2023 this was discontinued 02/2024 due to GI side effect with 0.5 mg dose  SUBJECTIVE:   During the last visit (12/11/2023): A1c 7.9 %.      Today (04/09/2024): Jamie Burke is here for a follow up on diabetes management.  She checks her blood sugars 2 times daily. She  does not have hypoglycemic episodes    She has been exercising  She typically snacks all day  Denies nausea or vomiting  Denies diarrhea but has constipation , she attributes constipation to NSAID's   HOME DIABETES  REGIMEN:  Humalog  Mix 60 units with Breakfast and 60 units with Supper      METER DOWNLOAD SUMMARY:  This a.m. 135 90-day average 174      DIABETIC COMPLICATIONS: Microvascular complications:   Denies: CKD, retinopathy , neuropathy  Last eye exam: 02/2023   Macrovascular complications:    Denies: CAD, PVD, CVA     HISTORY:  Past Medical History:  Past Medical History:  Diagnosis Date   Diabetes mellitus    Dysplastic nevi    Gastroparesis    GERD (gastroesophageal reflux disease)    Hypertension    Melanoma (HCC)    Snoring 03/28/2022   Tachycardia 03/28/2022   Past Surgical History:  Past Surgical History:  Procedure Laterality Date   ABDOMINAL HYSTERECTOMY     CARPAL TUNNEL RELEASE     BL   TRIGGER FINGER RELEASE     Social History:  reports that she has never smoked. She has never used smokeless tobacco. She reports that she does not drink alcohol and does not use drugs. Family History:  Family History  Problem Relation Age of Onset   Diabetes Mother    Heart disease Mother    Hypertension Mother    Stroke Mother    Hypertension Father    Stroke Father    Cancer Brother 66       Lung Cancer   Kidney disease Brother  Heart disease Maternal Aunt      HOME MEDICATIONS: Allergies as of 04/09/2024       Reactions   Atenolol Other (See Comments)   Atorvastatin  Other (See Comments)   Canagliflozin Other (See Comments)   Liraglutide Other (See Comments)   Lisinopril  Swelling   Lisinopril -hydrochlorothiazide  Other (See Comments)   Losartan Potassium-hctz Other (See Comments)   Metformin  Hcl Other (See Comments)   Sumatriptan  Other (See Comments)   Very crazy dreams   Sulfa Antibiotics Rash        Medication List        Accurate as of April 09, 2024  9:19 AM. If you have any questions, ask your nurse or doctor.          Accu-Chek Guide Me w/Device Kit Use as directed 3 times daily to test blood sugar E11.65   Accu-Chek Guide test  strip Generic drug: glucose blood USE AS INSTRUCTED TO TEST BLOOD  SUGAR 3 TIMES DAILY   Accu-Chek Softclix Lancets lancets USE AS INSTRUCTED 3 TIMES  DAILY   dicyclomine 20 MG tablet Commonly known as: BENTYL   fluconazole 150 MG tablet Commonly known as: DIFLUCAN TAKE ORALLY ONE TABLET ONCE AND YOU MAY REPEAT MEDICATION IN 3 DAYS IF WITH CONTINUED VAGINAL SYMPTOMS.   hydrochlorothiazide  25 MG tablet Commonly known as: HYDRODIURIL  Take 1 tablet (25 mg total) by mouth daily.   Insulin  Lispro Prot & Lispro (75-25) 100 UNIT/ML Kwikpen Commonly known as: HumaLOG  Mix 75/25 KwikPen Inject 56 Units into the skin in the morning and at bedtime.   Insulin  Pen Needle 32G X 4 MM Misc 1 Device by Does not apply route in the morning and at bedtime.   meloxicam 15 MG tablet Commonly known as: MOBIC Take 1 tablet by mouth daily.   naproxen 500 MG tablet Commonly known as: NAPROSYN TAKE 1 TABLET BY MOUTH TWICE DAILY AS NEEDED WITH FOOD FOR PAIN   naproxen 375 MG tablet Commonly known as: NAPROSYN Take 375 mg by mouth 2 (two) times daily as needed.   Ozempic  (0.25 or 0.5 MG/DOSE) 2 MG/3ML Sopn Generic drug: Semaglutide (0.25 or 0.5MG /DOS) Inject 0.5 mg into the skin once a week.   pantoprazole 40 MG tablet Commonly known as: PROTONIX Take 1 tablet by mouth daily as needed.   pantoprazole 40 MG tablet Commonly known as: PROTONIX Take 40 mg by mouth daily as needed.   rosuvastatin  10 MG tablet Commonly known as: CRESTOR  Take 1 tablet (10 mg total) by mouth daily.         OBJECTIVE:   Vital Signs: BP 122/80 (BP Location: Left Arm, Patient Position: Sitting, Cuff Size: Normal)   Pulse 91   Ht 5' 4 (1.626 m)   Wt 225 lb (102.1 kg)   SpO2 97%   BMI 38.62 kg/m   Wt Readings from Last 3 Encounters:  04/09/24 225 lb (102.1 kg)  02/06/24 219 lb (99.3 kg)  12/11/23 219 lb (99.3 kg)     Exam: General: Pt appears well and is in NAD  Lungs: Clear with good BS bilat    Heart: RRR   Extremities: No pretibial edema.   Neuro: MS is good with appropriate affect, pt is alert and Ox3    DM foot exam: 02/06/2024 through PCPs office      DATA REVIEWED:  Lab Results  Component Value Date   HGBA1C 8.8 (A) 04/09/2024   HGBA1C 7.9 (A) 12/11/2023   HGBA1C 8.7 (A) 08/02/2023    Latest Reference  Range & Units 08/02/23 14:05  Sodium 135 - 145 mEq/L 138  Potassium 3.5 - 5.1 mEq/L 3.8  Chloride 96 - 112 mEq/L 102  CO2 19 - 32 mEq/L 27  Glucose 70 - 99 mg/dL 093 (H)  BUN 6 - 23 mg/dL 10  Creatinine 2.35 - 5.73 mg/dL 2.20  Calcium  8.4 - 10.5 mg/dL 9.9  GFR >25.42 mL/min 73.79    Latest Reference Range & Units 08/02/23 14:05  Creatinine,U mg/dL 70.6  Microalb, Ur 0.0 - 1.9 mg/dL <2.3  MICROALB/CREAT RATIO 0.0 - 30.0 mg/g 1.5    Latest Reference Range & Units 08/07/23 08:42  Total CHOL/HDL Ratio 0.0 - 4.4 ratio 4.2  Cholesterol, Total 100 - 199 mg/dL 762  HDL Cholesterol >83 mg/dL 35 (L)  Triglycerides 0 - 149 mg/dL 151  VLDL Cholesterol Cal 5 - 40 mg/dL 20  LDL Chol Calc (NIH) 0 - 99 mg/dL 92  (L): Data is abnormally low        ASSESSMENT / PLAN / RECOMMENDATIONS:   1) Type 2 Diabetes Mellitus, Sub-optimally  controlled, With Neuropathic complications - Most recent A1c of 8.8 %. Goal A1c <7.0 %.      -A1c has increased from 7.9% to 8.9% -She could not tolerate Ozempic  0.5 mg due to GI side effects, she was getting it through patient assistance program - She is intolerant to metformin , as well as invokana -Intolerant to Rybelsus  Rybelsus  due to leg pains , and the second time lip swelling -I have attempted to switch her from insulin  mix to multiple daily injections of insulin  but this was not a successful experience for her -She did not tolerate Trulicity  due to abdominal pain - Historically she has declined CGM technology, but today she would like to try Dexcom G7, a prescription will be faxed to DME supplier - I will increase her insulin  in  the morning as below - A referral to our CDE has been placed   MEDICATIONS: Change  Humalog  Mix 66 units with breakfast, 60 units with supper   EDUCATION / INSTRUCTIONS: BG monitoring instructions: Patient is instructed to check her blood sugars 2 times a day, before meals. Call [facility] if: BG persistently < 70  I reviewed the Rule of 15 for the treatment of hypoglycemia in detail with the patient. Literature supplied.    2) Diabetic complications:   Eye: Does not have known diabetic retinopathy.  Neuro/ Feet: Does have known diabetic peripheral neuropathy based on her  Exam  Renal: Patient does not have known baseline CKD. She is not on an ACEI/ARB at present.C She is allergic to lisinopril    3) Dyslipidemia :  - She has not been taking zetia  nor rosuvasttain - Discussed cardiovascular benefits of statin therapy    F/U in 4  months    Signed electronically by: Natale Bail, MD  [facility]  [facility] [address] [place] [place], [place] [address] Phone: [phone] FAX: [phone]   CC: Sun, Vyvyan, MD [address] [place] [address] Phone: [phone]  Fax: [phone]  Return to Endocrinology clinic as below: Future Appointments  Date Time Provider Department Center  05/15/2024  9:15 AM Luella Sager, DPM [facility]

## 2024-04-10 ENCOUNTER — Telehealth: Payer: Self-pay

## 2024-04-10 NOTE — Telephone Encounter (Signed)
[employer] application has been faxed  

## 2024-04-16 ENCOUNTER — Telehealth: Payer: Self-pay

## 2024-04-16 MED ORDER — DEXCOM G7 RECEIVER DEVI: 1.0000 | Status: DC

## 2024-04-16 NOTE — Telephone Encounter (Signed)
 Receiver placed up front

## 2024-04-17 NOTE — Telephone Encounter (Signed)
 Patient picked up [facility] receiver

## 2024-04-22 MED ORDER — DEXCOM G7 SENSOR MISC: 1.0000 | Status: DC

## 2024-04-24 DIAGNOSIS — K5904 Chronic idiopathic constipation: Secondary | ICD-10-CM | POA: Diagnosis not present

## 2024-04-24 DIAGNOSIS — K219 Gastro-esophageal reflux disease without esophagitis: Secondary | ICD-10-CM | POA: Diagnosis not present

## 2024-04-24 DIAGNOSIS — R1310 Dysphagia, unspecified: Secondary | ICD-10-CM | POA: Diagnosis not present

## 2024-04-25 ENCOUNTER — Other Ambulatory Visit: Payer: Self-pay

## 2024-04-25 DIAGNOSIS — R1310 Dysphagia, unspecified: Secondary | ICD-10-CM

## 2024-04-25 DIAGNOSIS — K219 Gastro-esophageal reflux disease without esophagitis: Secondary | ICD-10-CM

## 2024-04-29 ENCOUNTER — Other Ambulatory Visit: Payer: Self-pay

## 2024-05-02 ENCOUNTER — Other Ambulatory Visit: Payer: Self-pay | Admitting: Internal Medicine

## 2024-05-06 DIAGNOSIS — E1165 Type 2 diabetes mellitus with hyperglycemia: Secondary | ICD-10-CM | POA: Diagnosis not present

## 2024-05-15 ENCOUNTER — Ambulatory Visit: Admitting: Podiatry

## 2024-06-03 ENCOUNTER — Encounter: Attending: Internal Medicine | Admitting: Dietician

## 2024-06-03 ENCOUNTER — Encounter: Payer: Self-pay | Admitting: Dietician

## 2024-06-03 VITALS — Ht 64.0 in | Wt 225.0 lb

## 2024-06-03 DIAGNOSIS — E119 Type 2 diabetes mellitus without complications: Secondary | ICD-10-CM | POA: Diagnosis not present

## 2024-06-03 DIAGNOSIS — Z794 Long term (current) use of insulin: Secondary | ICD-10-CM | POA: Insufficient documentation

## 2024-06-03 NOTE — Patient Instructions (Addendum)
 Stay active.  Aim for 30 minutes of exercise most every day. Try 1-2 T ground flax seeds in oatmeal, greek yogurt, or applesauce.  Drink plenty of water. 1/2 cup beans per day 1/2 your plate vegetables   Mindfulness:  Consistently scheduled meal - avoid skipping  Choices  Eat slowly  Away from distraction (sitting in kitchen or dining room)  Stop eating when satisfied  Before a snack ask, Am I hungry or eating for another reason?   What can I do instead if I am not hungry?  Try to find something every day that brings you joy!   Blood glucose goals:  80-130 fasting   100-180 two hours after starting any meal  Generally a rise of 40-60 points after a meal is normal  A1C Goal:   Less than 7%.  Your last A1C was  Lab Results  Component Value Date   HGBA1C 8.8 (A) 04/09/2024

## 2024-06-03 NOTE — Progress Notes (Signed)
 9084   Diabetes Self-Management Education  Visit Type: First/Initial  Appt. Start Time: 0915 Appt. End Time: 1015  06/03/2024  Jamie Burke, identified by name and date of birth, is a 78 y.o. female with a diagnosis of Diabetes: Type 2.   ASSESSMENT Patient is here today alone.   She was last seen in this office by another RD on 02/13/2020. She is not wearing the Dexcom Sensor.  She states that she was very itchy and this stopped when the sensor was removed but patient does not know if this was a direct correlation.  She will try this again when she gets more sensors.  Referral:  Type 2 Diabetes  Donell Redgie Butts, MD  History includes:  Type 2 Diabetes (1999), skin cancer, GERD, HTN, constipation Labs noted to include:  A1C 8.8% 04/09/2024 increased from 7.9% 12/11/2023, eGFR 73 on 10/9/2-24 Medications include:  Humalog  Mix 75/25 60 units bid Sleep - poor  64 225 lbs  Patient lives with her husband.  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 She states exercise is difficult due to lack of motivation.  She has been doing armchair exercises from Youtube 3 times per week.  She wants to start walking.  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  She states that she struggles with stress from her children and has a little depression.  Height 5' 4 (1.626 m), weight 225 lb (102.1 kg). Body mass index is 38.62 kg/m.   Diabetes Self-Management Education - 06/03/24 0929       Visit Information   Visit Type First/Initial      Initial Visit   Diabetes Type Type 2    Date Diagnosed 1999    Are you currently following a meal plan? No    Are you taking your medications as prescribed? Yes      Health Coping   How would you rate your overall health? Excellent      Psychosocial Assessment   Patient Belief/Attitude about Diabetes Afraid    What is the hardest part about your diabetes right now, causing  you the most concern, or is the most worrisome to you about your diabetes?   Making healty food and beverage choices;Being active    Self-care barriers None    Self-management support Doctor's office    Other persons present Patient    Patient Concerns Nutrition/Meal planning;Weight Control    Special Needs None    Preferred Learning Style No preference indicated    Learning Readiness Ready    How often do you need to have someone help you when you read instructions, pamphlets, or other written materials from your doctor or pharmacy? 1 - Never    What is the last grade level you completed in school? 12      Pre-Education Assessment   Patient understands the diabetes disease and treatment process. Needs Review    Patient understands incorporating nutritional management into lifestyle. Needs Review    Patient undertands incorporating physical activity into lifestyle. Needs Review    Patient understands using medications safely. Needs Review    Patient understands monitoring blood glucose, interpreting and using results Needs Review    Patient understands prevention, detection, and treatment of acute complications. Needs Review    Patient understands prevention, detection, and treatment of chronic complications. Needs Review    Patient understands how to develop strategies to address psychosocial issues. Needs Review    Patient  understands how to develop strategies to promote health/change behavior. Needs Review      Complications   Last HgB A1C per patient/outside source 8.8 %   04/09/2024 increased from 7.9% 12/11/2023   How often do you check your blood sugar? 1-2 times/day    Fasting Blood glucose range (mg/dL) 869-820   858 this am   Postprandial Blood glucose range (mg/dL) >799;819-799    Number of hypoglycemic episodes per month 2    Can you tell when your blood sugar is low? Yes    What do you do if your blood sugar is low? drinks soda and eats something    Number of hyperglycemic  episodes ( >200mg /dL): Daily    Have you had a dilated eye exam in the past 12 months? Yes    Have you had a dental exam in the past 12 months? No    Are you checking your feet? Yes    How many days per week are you checking your feet? 1      Dietary Intake   Breakfast packet of instant oatmeal (raisin and nut)    Snack (morning) none    Lunch leftovers    Snack (afternoon) chips    Dinner baked chicken, brown rice, green beans, peas and carrots    Snack (evening) small piece of cake    Beverage(s) water, diet soda, coffee flavored creamer      Activity / Exercise   Activity / Exercise Type Light (walking / raking leaves)    How many days per week do you exercise? 3    How many minutes per day do you exercise? 35    Total minutes per week of exercise 105      Patient Education   Previous Diabetes Education Yes   2021   Disease Pathophysiology Definition of diabetes, type 1 and 2, and the diagnosis of diabetes    Healthy Eating Role of diet in the treatment of diabetes and the relationship between the three main macronutrients and blood glucose level;Plate Method;Meal options for control of blood glucose level and chronic complications.;Reviewed blood glucose goals for pre and post meals and how to evaluate the patients' food intake on their blood glucose level.    Being Active Role of exercise on diabetes management, blood pressure control and cardiac health.;Helped patient identify appropriate exercises in relation to his/her diabetes, diabetes complications and other health issue.    Medications Reviewed patients medication for diabetes, action, purpose, timing of dose and side effects.    Monitoring Identified appropriate SMBG and/or A1C goals.;Purpose and frequency of SMBG.    Acute complications Taught prevention, symptoms, and  treatment of hypoglycemia - the 15 rule.    Diabetes Stress and Support Identified and addressed patients feelings and concerns about diabetes;Worked with  patient to identify barriers to care and solutions;Role of stress on diabetes      Individualized Goals (developed by patient)   Nutrition General guidelines for healthy choices and portions discussed    Physical Activity Exercise 5-7 days per week;30 minutes per day    Medications take my medication as prescribed    Monitoring  Consistenly use CGM    Problem Solving Eating Pattern;Sleep Pattern;Addressing barriers to behavior change    Reducing Risk examine blood glucose patterns;do foot checks daily;treat hypoglycemia with 15 grams of carbs if blood glucose less than 70mg /dL      Post-Education Assessment   Patient understands the diabetes disease and treatment process. Comprehends key points  Patient understands incorporating nutritional management into lifestyle. Comprehends key points    Patient undertands incorporating physical activity into lifestyle. Comprehends key points    Patient understands using medications safely. Comphrehends key points    Patient understands monitoring blood glucose, interpreting and using results Comprehends key points    Patient understands prevention, detection, and treatment of acute complications. Comprehends key points    Patient understands prevention, detection, and treatment of chronic complications. Comprehends key points    Patient understands how to develop strategies to address psychosocial issues. Comprehends key points    Patient understands how to develop strategies to promote health/change behavior. Comprehends key points      Outcomes   Expected Outcomes Demonstrated interest in learning. Expect positive outcomes    Future DMSE PRN    Program Status Completed          Individualized Plan for Diabetes Self-Management Training:   Learning Objective:  Patient will have a greater understanding of diabetes self-management. Patient education plan is to attend individual and/or group sessions per assessed needs and concerns.   Plan:    Patient Instructions  Stay active.  Aim for 30 minutes of exercise most every day. Try 1-2 T ground flax seeds in oatmeal, greek yogurt, or applesauce.  Drink plenty of water. 1/2 cup beans per day 1/2 your plate vegetables   Mindfulness:  Consistently scheduled meal - avoid skipping  Choices  Eat slowly  Away from distraction (sitting in kitchen or dining room)  Stop eating when satisfied  Before a snack ask, Am I hungry or eating for another reason?   What can I do instead if I am not hungry?  Try to find something every day that brings you joy!   Blood glucose goals:  80-130 fasting   100-180 two hours after starting any meal  Generally a rise of 40-60 points after a meal is normal  A1C Goal:   Less than 7%.  Your last A1C was  Lab Results  Component Value Date   HGBA1C 8.8 (A) 04/09/2024     Expected Outcomes:  Demonstrated interest in learning. Expect positive outcomes  Education material provided: My Plate, Snack sheet, and Diabetes Resources, hypoglycemia, hyperglycemia  If problems or questions, patient to contact team via:  Phone  Future DSME appointment: PRN

## 2024-06-26 ENCOUNTER — Ambulatory Visit: Admitting: Podiatry

## 2024-06-28 ENCOUNTER — Ambulatory Visit (INDEPENDENT_AMBULATORY_CARE_PROVIDER_SITE_OTHER): Admitting: Podiatry

## 2024-06-28 DIAGNOSIS — B351 Tinea unguium: Secondary | ICD-10-CM

## 2024-06-28 DIAGNOSIS — M79674 Pain in right toe(s): Secondary | ICD-10-CM

## 2024-06-28 DIAGNOSIS — M79675 Pain in left toe(s): Secondary | ICD-10-CM | POA: Diagnosis not present

## 2024-06-28 NOTE — Progress Notes (Signed)
  Subjective:  Patient ID: Jamie Burke, female    DOB: [birth year],  MRN: [id number]  Chief Complaint  Patient presents with   Nail Problem    Nail trim  Pain in joint of right big toe   78 y.o. female returns for the above complaint.  Patient presents with acute onychodystrophy mycotic toenails x 10 mild pain on palpation worse with ambulation and shoe pressure patient would like to have it debrided down unable to do it herself denies any other acute complaints  Objective:  There were no vitals filed for this visit. Podiatric Exam: Vascular: dorsalis pedis and posterior tibial pulses are palpable bilateral. Capillary return is immediate. Temperature gradient is WNL. Skin turgor WNL  Sensorium: Normal Semmes Weinstein monofilament test. Normal tactile sensation bilaterally. Nail Exam: Pt has thick disfigured discolored nails with subungual debris noted bilateral entire nail hallux through fifth toenails.  Pain on palpation to the nails. Ulcer Exam: There is no evidence of ulcer or pre-ulcerative changes or infection. Orthopedic Exam: Muscle tone and strength are WNL. No limitations in general ROM. No crepitus or effusions noted.  Skin: No Porokeratosis. No infection or ulcers    Assessment & Plan:   1. Pain due to onychomycosis of toenails of both feet     Patient was evaluated and treated and all questions answered.  Onychomycosis with pain  -Nails palliatively debrided as below. -Educated on self-care  Procedure: Nail Debridement Rationale: pain  Type of Debridement: manual, sharp debridement. Instrumentation: Nail nipper, rotary burr. Number of Nails: 10  Procedures and Treatment: Consent by patient was obtained for treatment procedures. The patient understood the discussion of treatment and procedures well. All questions were answered thoroughly reviewed. Debridement of mycotic and hypertrophic toenails, 1 through 5 bilateral and clearing of subungual debris. No  ulceration, no infection noted.  Return Visit-Office Procedure: Patient instructed to return to the office for a follow up visit 3 months for continued evaluation and treatment.  Franky Blanch, DPM    No follow-ups on file.

## 2024-07-11 NOTE — Telephone Encounter (Signed)
**Note De-Identified Wallace Cogliano Obfuscation** Per the Sleep Lab's notes in the pts appointment desk, she cancelled her NPSG as follows: Called to schedule appt. pt stated she didn't wish to schedule appt due to financial reasons 09/27/23 TP.   I called the pt today and asked her if she is better able to have the NPSG done now and she stated that it is not as she has more medical bills that she owes. I encouraged her to have the test done as her insurance does not require a PA for it so it is covered but she is not interested at this time. She states that if she changes her mind she will discuss with Dr Raford at a later time.  She thanked me for my call.

## 2024-07-29 DIAGNOSIS — I10 Essential (primary) hypertension: Secondary | ICD-10-CM | POA: Diagnosis not present

## 2024-07-29 DIAGNOSIS — R059 Cough, unspecified: Secondary | ICD-10-CM | POA: Diagnosis not present

## 2024-08-02 DIAGNOSIS — L820 Inflamed seborrheic keratosis: Secondary | ICD-10-CM | POA: Diagnosis not present

## 2024-08-02 DIAGNOSIS — L918 Other hypertrophic disorders of the skin: Secondary | ICD-10-CM | POA: Diagnosis not present

## 2024-08-12 ENCOUNTER — Ambulatory Visit: Admitting: Internal Medicine

## 2024-08-12 ENCOUNTER — Other Ambulatory Visit: Payer: Self-pay | Admitting: Internal Medicine

## 2024-08-12 ENCOUNTER — Other Ambulatory Visit

## 2024-08-12 ENCOUNTER — Encounter: Payer: Self-pay | Admitting: Internal Medicine

## 2024-08-12 VITALS — BP 134/76 | HR 94 | Ht 64.0 in | Wt 225.0 lb

## 2024-08-12 DIAGNOSIS — E1165 Type 2 diabetes mellitus with hyperglycemia: Secondary | ICD-10-CM | POA: Diagnosis not present

## 2024-08-12 DIAGNOSIS — E1142 Type 2 diabetes mellitus with diabetic polyneuropathy: Secondary | ICD-10-CM | POA: Diagnosis not present

## 2024-08-12 DIAGNOSIS — Z794 Long term (current) use of insulin: Secondary | ICD-10-CM | POA: Diagnosis not present

## 2024-08-12 DIAGNOSIS — E785 Hyperlipidemia, unspecified: Secondary | ICD-10-CM | POA: Diagnosis not present

## 2024-08-12 LAB — POCT GLYCOSYLATED HEMOGLOBIN (HGB A1C): Hemoglobin A1C: 8.5 % — AB (ref 4.0–5.6)

## 2024-08-12 MED ORDER — INSULIN LISPRO PROT & LISPRO (75-25 MIX) 100 UNIT/ML KWIKPEN: PEN_INJECTOR | SUBCUTANEOUS | 3 refills | Status: DC

## 2024-08-12 MED ORDER — INSULIN PEN NEEDLE 32G X 4 MM MISC: 1.0000 | Freq: Two times a day (BID) | 3 refills | Status: DC

## 2024-08-12 NOTE — Patient Instructions (Signed)
-   Change Humalog  Mix 70 units with Breakfast and 60 units with Supper   HOW TO TREAT LOW BLOOD SUGARS (Blood sugar LESS THAN 70 MG/DL) Please follow the RULE OF 15 for the treatment of hypoglycemia treatment (when your (blood sugars are less than 70 mg/dL)   STEP 1: Take 15 grams of carbohydrates when your blood sugar is low, which includes:  3-4 GLUCOSE TABS  OR 3-4 OZ OF JUICE OR REGULAR SODA OR ONE TUBE OF GLUCOSE GEL    STEP 2: RECHECK blood sugar in 15 MINUTES STEP 3: If your blood sugar is still low at the 15 minute recheck --> then, go back to STEP 1 and treat AGAIN with another 15 grams of carbohydrates.

## 2024-08-12 NOTE — Progress Notes (Deleted)
 Name: Jamie Burke  Age/ Sex: 78 y.o., female   MRN/ DOB: [id number], 11/30/1945     PCP: Sun, Vyvyan, MD   Reason for Endocrinology Evaluation: Type 2 Diabetes Mellitus  Initial Endocrine Consultative Visit: 12/25/2019    PATIENT IDENTIFIER: [name] is a 78 y.o. female with a past medical history of T2DM and HTN. The patient has followed with Endocrinology clinic since 12/25/2019 for consultative assistance with management of her diabetes.  DIABETIC HISTORY:  Jamie Burke was diagnosed with DM > 20 yr ago. She has been on various  glycemic agents (metformin - weakness in the legs and incontinence, invokana - weakness in the legs. Victoza , Trulicity  , she does not recall any side effects ). Her hemoglobin A1c has ranged from 8.6% in 2012, peaking at 8.9% in 2021.  On her initial visit to our clinic she had an A1c of 9.2%, she was on insulin  mix and had metformin  but she was not taking it due to GI effects.   She had tried Rybelsus  twice the first time she stopped it in October 2022 due to leg pains, the second time she stopped it in May 2023 due to lip swelling   By April 2023 and due to persistent hyperglycemia we will switch Humalog  mix to basal/prandial, by May 2023 she called back stating lip swelling with Toujeo  and Rybelsus  and persistent hyperglycemia.  She returned to using insulin  mix and stopped Rybelsus    She did not tolerate Trulicity  due to abdominal pain 01/2023  Started Ozempic  07/2023 this was discontinued 02/2024 due to GI side effect with 0.5 mg dose  SUBJECTIVE:   During the last visit (04/09/2024): A1c 8.8%.      Today (08/12/2024): Jamie Burke is here for a follow up on diabetes management.  She checks her blood sugars 2 times daily. She  does not have hypoglycemic episodes   Patient had a follow-up with podiatry 06/28/2024 She has been exercising  She typically snacks all day  Denies nausea or vomiting  Denies diarrhea but has constipation , she  attributes constipation to NSAID's   HOME DIABETES REGIMEN:  Humalog  Mix 66 units with Breakfast and 60 units with Supper      METER DOWNLOAD SUMMARY:  This a.m. 135 90-day average 174      DIABETIC COMPLICATIONS: Microvascular complications:   Denies: CKD, retinopathy , neuropathy  Last eye exam: 02/2023   Macrovascular complications:    Denies: CAD, PVD, CVA     HISTORY:  Past Medical History:  Past Medical History:  Diagnosis Date   Diabetes mellitus    Dysplastic nevi    Gastroparesis    GERD (gastroesophageal reflux disease)    Hypertension    Melanoma (HCC)    Snoring 03/28/2022   Tachycardia 03/28/2022   Past Surgical History:  Past Surgical History:  Procedure Laterality Date   ABDOMINAL HYSTERECTOMY     CARPAL TUNNEL RELEASE     BL   TRIGGER FINGER RELEASE     Social History:  reports that she has never smoked. She has never used smokeless tobacco. She reports that she does not drink alcohol and does not use drugs. Family History:  Family History  Problem Relation Age of Onset   Diabetes Mother    Heart disease Mother    Hypertension Mother    Stroke Mother    Hypertension Father    Stroke Father    Cancer Brother 13       Lung Cancer  Kidney disease Brother    Heart disease Maternal Aunt      HOME MEDICATIONS: Allergies as of 08/12/2024       Reactions   Atenolol Other (See Comments)   Atorvastatin  Other (See Comments)   Canagliflozin Other (See Comments)   Liraglutide Other (See Comments)   Lisinopril  Swelling   Lisinopril -hydrochlorothiazide  Other (See Comments)   Losartan Potassium-hctz Other (See Comments)   Metformin  Hcl Other (See Comments)   Sumatriptan  Other (See Comments)   Very crazy dreams   Sulfa Antibiotics Rash        Medication List        Accurate as of August 12, 2024  7:04 AM. If you have any questions, ask your nurse or doctor.          Accu-Chek Guide Me w/Device Kit Use as directed 3 times  daily to test blood sugar E11.65   Accu-Chek Guide Test test strip Generic drug: glucose blood USE AS INSTRUCTED TO TEST BLOOD  SUGAR 3 TIMES DAILY   Accu-Chek Softclix Lancets lancets USE AS INSTRUCTED 3 TIMES  DAILY   Dexcom G7 Receiver Devi 1 Device by Does not apply route continuous.   Dexcom G7 Sensor Misc 1 Device by Does not apply route as directed. Every 10 days   Dexcom G7 Sensor Misc 1 Device by Does not apply route continuous.   dicyclomine 20 MG tablet Commonly known as: BENTYL   fluconazole 150 MG tablet Commonly known as: DIFLUCAN TAKE ORALLY ONE TABLET ONCE AND YOU MAY REPEAT MEDICATION IN 3 DAYS IF WITH CONTINUED VAGINAL SYMPTOMS.   hydrochlorothiazide  25 MG tablet Commonly known as: HYDRODIURIL  Take 1 tablet (25 mg total) by mouth daily.   Insulin  Lispro Prot & Lispro (75-25) 100 UNIT/ML Kwikpen Commonly known as: HumaLOG  Mix 75/25 KwikPen Inject 66 Units into the skin daily before breakfast AND 60 Units daily before supper.   Insulin  Pen Needle 32G X 4 MM Misc 1 Device by Does not apply route in the morning and at bedtime.   meloxicam 15 MG tablet Commonly known as: MOBIC Take 1 tablet by mouth daily.   naproxen 500 MG tablet Commonly known as: NAPROSYN TAKE 1 TABLET BY MOUTH TWICE DAILY AS NEEDED WITH FOOD FOR PAIN   naproxen 375 MG tablet Commonly known as: NAPROSYN Take 375 mg by mouth 2 (two) times daily as needed.   pantoprazole 40 MG tablet Commonly known as: PROTONIX Take 40 mg by mouth daily as needed.   rosuvastatin  10 MG tablet Commonly known as: CRESTOR  Take 1 tablet (10 mg total) by mouth daily.         OBJECTIVE:   Vital Signs: There were no vitals taken for this visit.  Wt Readings from Last 3 Encounters:  06/03/24 225 lb (102.1 kg)  04/09/24 225 lb (102.1 kg)  02/06/24 219 lb (99.3 kg)     Exam: General: Pt appears well and is in NAD  Lungs: Clear with good BS bilat   Heart: RRR   Extremities: No pretibial  edema.   Neuro: MS is good with appropriate affect, pt is alert and Ox3    DM foot exam: 06/28/2024 through podiatry    DATA REVIEWED:  Lab Results  Component Value Date   HGBA1C 8.8 (A) 04/09/2024   HGBA1C 7.9 (A) 12/11/2023   HGBA1C 8.7 (A) 08/02/2023    Latest Reference Range & Units 08/02/23 14:05  Sodium 135 - 145 mEq/L 138  Potassium 3.5 - 5.1 mEq/L 3.8  Chloride  96 - 112 mEq/L 102  CO2 19 - 32 mEq/L 27  Glucose 70 - 99 mg/dL 837 (H)  BUN 6 - 23 mg/dL 10  Creatinine 9.59 - 8.79 mg/dL 9.21  Calcium  8.4 - 10.5 mg/dL 9.9  GFR >39.99 mL/min 73.79    Latest Reference Range & Units 08/02/23 14:05  Creatinine,U mg/dL 51.8  Microalb, Ur 0.0 - 1.9 mg/dL <9.2  MICROALB/CREAT RATIO 0.0 - 30.0 mg/g 1.5    Latest Reference Range & Units 08/07/23 08:42  Total CHOL/HDL Ratio 0.0 - 4.4 ratio 4.2  Cholesterol, Total 100 - 199 mg/dL 852  HDL Cholesterol >60 mg/dL 35 (L)  Triglycerides 0 - 149 mg/dL 893  VLDL Cholesterol Cal 5 - 40 mg/dL 20  LDL Chol Calc (NIH) 0 - 99 mg/dL 92  (L): Data is abnormally low        ASSESSMENT / PLAN / RECOMMENDATIONS:   1) Type 2 Diabetes Mellitus, Sub-optimally  controlled, With Neuropathic complications - Most recent A1c of 8.8 %. Goal A1c <7.0 %.      -A1c has increased from 7.9% to 8.9% -She could not tolerate Ozempic  0.5 mg due to GI side effects, she was getting it through patient assistance program - She is intolerant to metformin , as well as invokana -Intolerant to Rybelsus  Rybelsus  due to leg pains , and the second time lip swelling -I have attempted to switch her from insulin  mix to multiple daily injections of insulin  but this was not a successful experience for her -She did not tolerate Trulicity  due to abdominal pain - Historically she has declined CGM technology, but today she would like to try Dexcom G7, a prescription will be faxed to DME supplier - I will increase her insulin  in the morning as below - A referral to our CDE has  been placed   MEDICATIONS: Change  Humalog  Mix 66 units with breakfast, 60 units with supper   EDUCATION / INSTRUCTIONS: BG monitoring instructions: Patient is instructed to check her blood sugars 2 times a day, before meals. Call [facility] if: BG persistently < 70  I reviewed the Rule of 15 for the treatment of hypoglycemia in detail with the patient. Literature supplied.    2) Diabetic complications:   Eye: Does not have known diabetic retinopathy.  Neuro/ Feet: Does have known diabetic peripheral neuropathy based on her  Exam  Renal: Patient does not have known baseline CKD. She is not on an ACEI/ARB at present.C She is allergic to lisinopril    3) Dyslipidemia :  - She has not been taking zetia  nor rosuvasttain - Discussed cardiovascular benefits of statin therapy    F/U in 4  months    Signed electronically by: Stefano Redgie Butts, MD  [facility] Endocrinology  [facility] [address] [place] [place], [place] [address] Phone: [phone] FAX: [phone]   CC: Austin Nutley, MD [address] [place] [address] Phone: [phone]  Fax: [phone]  Return to Endocrinology clinic as below: Future Appointments  Date Time Provider Department Center  08/12/2024  8:50 AM Harvir Patry, Donell Redgie, MD [id number] None

## 2024-08-12 NOTE — Progress Notes (Unsigned)
 Name: Jamie Burke  Age/ Sex: 78 y.o., female   MRN/ DOB: [id number], 1946-07-18     PCP: Sun, Vyvyan, MD   Reason for Endocrinology Evaluation: Type 2 Diabetes Mellitus  Initial Endocrine Consultative Visit: 12/25/2019    PATIENT IDENTIFIER: Jamie Burke is a 78 y.o. female with a past medical history of T2DM and HTN. The patient has followed with Endocrinology clinic since 12/25/2019 for consultative assistance with management of her diabetes.  DIABETIC HISTORY:  Jamie Burke was diagnosed with DM > 20 yr ago. She has been on various  glycemic agents (metformin - weakness in the legs and incontinence, invokana - weakness in the legs. Victoza , Trulicity  , she does not recall any side effects ). Her hemoglobin A1c has ranged from 8.6% in 2012, peaking at 8.9% in 2021.  On her initial visit to our clinic she had an A1c of 9.2%, she was on insulin  mix and had metformin  but she was not taking it due to GI effects.   She had tried Rybelsus  twice the first time she stopped it in October 2022 due to leg pains, the second time she stopped it in May 2023 due to lip swelling   By April 2023 and due to persistent hyperglycemia we will switch Humalog  mix to basal/prandial, by May 2023 she called back stating lip swelling with Toujeo  and Rybelsus  and persistent hyperglycemia.  She returned to using insulin  mix and stopped Rybelsus    She did not tolerate Trulicity  due to abdominal pain 01/2023  Started Ozempic  07/2023 this was discontinued 02/2024 due to GI side effect with 0.5 mg dose  SUBJECTIVE:   During the last visit (04/09/2024): A1c 8.8%.      Today (08/12/2024): Jamie Burke is here for a follow up on diabetes management.  She checks her blood sugars 2 times daily. She  does not have hypoglycemic episodes   Patient had a follow-up with podiatry 06/28/2024 due to toe pain   She denies recently vomiting but has nausea due to postnasal drip  Cough medicine was costly ~ $22 which was  prescribed by PCP  Has chronic and occasional  constipation - on stool softners    HOME DIABETES REGIMEN:  Humalog  Mix 66 units with Breakfast and 60 units with Supper      METER DOWNLOAD SUMMARY:  This a.m. 156   10/19 189 @ 3 pm   124 - 279 mg/dL     DIABETIC COMPLICATIONS: Microvascular complications:   Denies: CKD, retinopathy , neuropathy  Last eye exam: 11/13/2023   Macrovascular complications:    Denies: CAD, PVD, CVA     HISTORY:  Past Medical History:  Past Medical History:  Diagnosis Date   Diabetes mellitus    Dysplastic nevi    Gastroparesis    GERD (gastroesophageal reflux disease)    Hypertension    Melanoma (HCC)    Snoring 03/28/2022   Tachycardia 03/28/2022   Past Surgical History:  Past Surgical History:  Procedure Laterality Date   ABDOMINAL HYSTERECTOMY     CARPAL TUNNEL RELEASE     BL   TRIGGER FINGER RELEASE     Social History:  reports that she has never smoked. She has never used smokeless tobacco. She reports that she does not drink alcohol and does not use drugs. Family History:  Family History  Problem Relation Age of Onset   Diabetes Mother    Heart disease Mother    Hypertension Mother    Stroke Mother  Hypertension Father    Stroke Father    Cancer Brother 14       Lung Cancer   Kidney disease Brother    Heart disease Maternal Aunt      HOME MEDICATIONS: Allergies as of 08/12/2024       Reactions   Atenolol Other (See Comments)   Atorvastatin  Other (See Comments)   Canagliflozin Other (See Comments)   Liraglutide Other (See Comments)   Lisinopril  Swelling   Lisinopril -hydrochlorothiazide  Other (See Comments)   Losartan Potassium-hctz Other (See Comments)   Metformin  Hcl Other (See Comments)   Sumatriptan  Other (See Comments)   Very crazy dreams   Sulfa Antibiotics Rash        Medication List        Accurate as of August 12, 2024 12:19 PM. If you have any questions, ask your nurse or doctor.           Accu-Chek Guide Me w/Device Kit USE AS DIRECTED 3 TIMES  DAILY TO TEST BLOOD SUGAR What changed: additional instructions Changed by: Donell PARAS Khalon Cansler   Accu-Chek Guide Test test strip Generic drug: glucose blood USE AS INSTRUCTED TO TEST BLOOD  SUGAR 3 TIMES DAILY   Accu-Chek Softclix Lancets lancets USE AS INSTRUCTED 3 TIMES  DAILY   Dexcom G7 Receiver Devi 1 Device by Does not apply route continuous.   Dexcom G7 Sensor Misc 1 Device by Does not apply route as directed. Every 10 days   Dexcom G7 Sensor Misc 1 Device by Does not apply route continuous.   dicyclomine 20 MG tablet Commonly known as: BENTYL   fluconazole 150 MG tablet Commonly known as: DIFLUCAN TAKE ORALLY ONE TABLET ONCE AND YOU MAY REPEAT MEDICATION IN 3 DAYS IF WITH CONTINUED VAGINAL SYMPTOMS.   hydrochlorothiazide  25 MG tablet Commonly known as: HYDRODIURIL  Take 1 tablet (25 mg total) by mouth daily.   Insulin  Lispro Prot & Lispro (75-25) 100 UNIT/ML Kwikpen Commonly known as: HumaLOG  Mix 75/25 KwikPen Inject 66 Units into the skin daily before breakfast AND 60 Units daily before supper.   Insulin  Pen Needle 32G X 4 MM Misc 1 Device by Does not apply route in the morning and at bedtime.   meloxicam 15 MG tablet Commonly known as: MOBIC Take 1 tablet by mouth daily.   naproxen 500 MG tablet Commonly known as: NAPROSYN TAKE 1 TABLET BY MOUTH TWICE DAILY AS NEEDED WITH FOOD FOR PAIN   naproxen 375 MG tablet Commonly known as: NAPROSYN Take 375 mg by mouth 2 (two) times daily as needed.   pantoprazole 40 MG tablet Commonly known as: PROTONIX Take 40 mg by mouth daily as needed.   rosuvastatin  10 MG tablet Commonly known as: CRESTOR  Take 1 tablet (10 mg total) by mouth daily.         OBJECTIVE:   Vital Signs: BP 134/76 (BP Location: Left Arm, Patient Position: Sitting, Cuff Size: Normal)   Pulse 94   Ht 5' 4 (1.626 m)   Wt 225 lb (102.1 kg)   SpO2 97%   BMI 38.62 kg/m    Wt Readings from Last 3 Encounters:  08/12/24 225 lb (102.1 kg)  06/03/24 225 lb (102.1 kg)  04/09/24 225 lb (102.1 kg)     Exam: General: Pt appears well and is in NAD  Lungs: Clear with good BS bilat   Heart: RRR   Extremities: No pretibial edema.   Neuro: MS is good with appropriate affect, pt is alert and Ox3  DM foot exam: 06/28/2024 through podiatry    DATA REVIEWED:  Lab Results  Component Value Date   HGBA1C 8.5 (A) 08/12/2024   HGBA1C 8.8 (A) 04/09/2024   HGBA1C 7.9 (A) 12/11/2023          ASSESSMENT / PLAN / RECOMMENDATIONS:   1) Type 2 Diabetes Mellitus, Sub-optimally  controlled, With Neuropathic complications - Most recent A1c of 8.5 %. Goal A1c <7.0 %.      -A1c remains above goal -She could not tolerate Ozempic  0.5 mg due to GI side effects, she was getting it through patient assistance program - She is intolerant to metformin , as well as invokana -Intolerant to Rybelsus  Rybelsus  due to leg pains , and the second time lip swelling -I have attempted to switch her from insulin  mix to multiple daily injections of insulin  but this was not a successful experience for her -She did not tolerate Trulicity  due to abdominal pain - I will increase morning insulin  as below, unfortunately she continues with dietary indiscretions and snacking  -She has attempted to use the Dexcom but insertion was painful and she opted to continue with fingersticks  MEDICATIONS: Change  Humalog  Mix 70 units with breakfast, 60 units with supper   EDUCATION / INSTRUCTIONS: BG monitoring instructions: Patient is instructed to check her blood sugars 2 times a day, before meals. Call [facility] if: BG persistently < 70  I reviewed the Rule of 15 for the treatment of hypoglycemia in detail with the patient. Literature supplied.    2) Diabetic complications:   Eye: Does not have known diabetic retinopathy.  Neuro/ Feet: Does have known diabetic peripheral  neuropathy based on her  Exam  Renal: Patient does not have known baseline CKD. She is not on an ACEI/ARB at present.C She is allergic to lisinopril    3) Dyslipidemia :  - She has not been on Crestor  nor Zetia  - Patient declines statin therapy and would like to focus on lifestyle changes   F/U in 4  months    Signed electronically by: Stefano Redgie Butts, MD  [facility] Endocrinology  [facility] [address] [place] [place], [place] [address] Phone: [phone] FAX: [phone]   CC: Austin Nutley, MD [address] [place] [address] Phone: [phone]  Fax: [phone]  Return to Endocrinology clinic as below: No future appointments.

## 2024-08-13 ENCOUNTER — Ambulatory Visit: Admitting: Internal Medicine

## 2024-08-13 ENCOUNTER — Ambulatory Visit: Payer: Self-pay | Admitting: Internal Medicine

## 2024-08-13 LAB — LIPID PANEL
Cholesterol: 191 mg/dL (ref <200)
HDL: 35 mg/dL — ABNORMAL LOW (ref >=50)
LDL Cholesterol (Calc): 124 mg/dL — ABNORMAL HIGH
Non-HDL Cholesterol (Calc): 156 mg/dL — ABNORMAL HIGH (ref <130)
Total CHOL/HDL Ratio: 5.5 (calc) — ABNORMAL HIGH (ref <5.0)
Triglycerides: 200 mg/dL — ABNORMAL HIGH (ref <150)

## 2024-08-13 LAB — BASIC METABOLIC PANEL WITH GFR
BUN: 9 mg/dL (ref 7–25)
CO2: 25 mmol/L (ref 20–32)
Calcium: 10.4 mg/dL (ref 8.6–10.4)
Chloride: 103 mmol/L (ref 98–110)
Creat: 0.74 mg/dL (ref 0.60–1.00)
Glucose, Bld: 155 mg/dL — ABNORMAL HIGH (ref 65–99)
Potassium: 4.1 mmol/L (ref 3.5–5.3)
Sodium: 136 mmol/L (ref 135–146)
eGFR: 83 mL/min/1.73m2 (ref >=60)

## 2024-08-13 LAB — MICROALBUMIN / CREATININE URINE RATIO
Creatinine, Urine: 33 mg/dL (ref 20–275)
Microalb Creat Ratio: 18 mg/g{creat} (ref <30)
Microalb, Ur: 0.6 mg/dL

## 2024-09-25 ENCOUNTER — Telehealth: Payer: Self-pay | Admitting: Nutrition

## 2024-09-25 NOTE — Telephone Encounter (Signed)
 Message left on my machine that patient is trying to get her [employer] Cares renewed for her medication and is needing Dr. Kris email.  Please call her ASAP

## 2024-09-26 NOTE — Telephone Encounter (Signed)
 Spoke with patient.

## 2024-10-10 ENCOUNTER — Telehealth: Payer: Self-pay | Admitting: Internal Medicine

## 2024-10-10 NOTE — Telephone Encounter (Signed)
 Given to thapa to fill out since shamleffer is out of office.

## 2024-10-10 NOTE — Telephone Encounter (Signed)
 Patient came in to office today and brought a [employer] Patient Assistance Program Application to be completed.  The application is in Dr. Kris folder in the front office.

## 2024-10-23 ENCOUNTER — Emergency Department (HOSPITAL_COMMUNITY)

## 2024-10-23 ENCOUNTER — Other Ambulatory Visit: Payer: Self-pay

## 2024-10-23 ENCOUNTER — Inpatient Hospital Stay (HOSPITAL_COMMUNITY)
Admission: EM | Admit: 2024-10-23 | Discharge: 2024-10-28 | DRG: 061 | Disposition: A | Discharge status: Home / Self Care | Attending: Neurology | Admitting: Neurology

## 2024-10-23 ENCOUNTER — Encounter (HOSPITAL_COMMUNITY): Payer: Self-pay

## 2024-10-23 DIAGNOSIS — K219 Gastro-esophageal reflux disease without esophagitis: Secondary | ICD-10-CM | POA: Diagnosis present

## 2024-10-23 DIAGNOSIS — J690 Pneumonitis due to inhalation of food and vomit: Secondary | ICD-10-CM | POA: Diagnosis present

## 2024-10-23 DIAGNOSIS — H5340 Unspecified visual field defects: Secondary | ICD-10-CM | POA: Diagnosis present

## 2024-10-23 DIAGNOSIS — I6350 Cerebral infarction due to unspecified occlusion or stenosis of unspecified cerebral artery: Secondary | ICD-10-CM

## 2024-10-23 DIAGNOSIS — I6389 Other cerebral infarction: Secondary | ICD-10-CM | POA: Diagnosis not present

## 2024-10-23 DIAGNOSIS — Z781 Physical restraint status: Secondary | ICD-10-CM | POA: Diagnosis not present

## 2024-10-23 DIAGNOSIS — G8194 Hemiplegia, unspecified affecting left nondominant side: Secondary | ICD-10-CM | POA: Diagnosis present

## 2024-10-23 DIAGNOSIS — Z794 Long term (current) use of insulin: Secondary | ICD-10-CM | POA: Diagnosis not present

## 2024-10-23 DIAGNOSIS — D72829 Elevated white blood cell count, unspecified: Secondary | ICD-10-CM

## 2024-10-23 DIAGNOSIS — Z888 Allergy status to other drugs, medicaments and biological substances status: Secondary | ICD-10-CM

## 2024-10-23 DIAGNOSIS — R569 Unspecified convulsions: Secondary | ICD-10-CM | POA: Diagnosis not present

## 2024-10-23 DIAGNOSIS — I639 Cerebral infarction, unspecified: Principal | ICD-10-CM | POA: Diagnosis present

## 2024-10-23 DIAGNOSIS — Z1152 Encounter for screening for COVID-19: Secondary | ICD-10-CM

## 2024-10-23 DIAGNOSIS — Y92410 Unspecified street and highway as the place of occurrence of the external cause: Secondary | ICD-10-CM | POA: Diagnosis not present

## 2024-10-23 DIAGNOSIS — R29810 Facial weakness: Secondary | ICD-10-CM | POA: Diagnosis present

## 2024-10-23 DIAGNOSIS — E1143 Type 2 diabetes mellitus with diabetic autonomic (poly)neuropathy: Secondary | ICD-10-CM | POA: Diagnosis present

## 2024-10-23 DIAGNOSIS — E1165 Type 2 diabetes mellitus with hyperglycemia: Secondary | ICD-10-CM | POA: Diagnosis present

## 2024-10-23 DIAGNOSIS — R29710 NIHSS score 10: Secondary | ICD-10-CM | POA: Diagnosis present

## 2024-10-23 DIAGNOSIS — I63421 Cerebral infarction due to embolism of right anterior cerebral artery: Secondary | ICD-10-CM | POA: Diagnosis present

## 2024-10-23 DIAGNOSIS — F32A Depression, unspecified: Secondary | ICD-10-CM | POA: Diagnosis present

## 2024-10-23 DIAGNOSIS — I6340 Cerebral infarction due to embolism of unspecified cerebral artery: Secondary | ICD-10-CM | POA: Diagnosis not present

## 2024-10-23 DIAGNOSIS — I371 Nonrheumatic pulmonary valve insufficiency: Secondary | ICD-10-CM | POA: Diagnosis present

## 2024-10-23 DIAGNOSIS — I10 Essential (primary) hypertension: Secondary | ICD-10-CM | POA: Diagnosis present

## 2024-10-23 DIAGNOSIS — R29700 NIHSS score 0: Secondary | ICD-10-CM | POA: Diagnosis not present

## 2024-10-23 DIAGNOSIS — G936 Cerebral edema: Secondary | ICD-10-CM | POA: Diagnosis not present

## 2024-10-23 DIAGNOSIS — Z6839 Body mass index (BMI) 39.0-39.9, adult: Secondary | ICD-10-CM

## 2024-10-23 DIAGNOSIS — E785 Hyperlipidemia, unspecified: Secondary | ICD-10-CM | POA: Diagnosis present

## 2024-10-23 DIAGNOSIS — G9389 Other specified disorders of brain: Secondary | ICD-10-CM | POA: Diagnosis not present

## 2024-10-23 DIAGNOSIS — R414 Neurologic neglect syndrome: Secondary | ICD-10-CM | POA: Diagnosis present

## 2024-10-23 DIAGNOSIS — I63411 Cerebral infarction due to embolism of right middle cerebral artery: Secondary | ICD-10-CM | POA: Diagnosis not present

## 2024-10-23 DIAGNOSIS — Z8249 Family history of ischemic heart disease and other diseases of the circulatory system: Secondary | ICD-10-CM | POA: Diagnosis not present

## 2024-10-23 DIAGNOSIS — F05 Delirium due to known physiological condition: Secondary | ICD-10-CM | POA: Diagnosis not present

## 2024-10-23 DIAGNOSIS — H518 Other specified disorders of binocular movement: Secondary | ICD-10-CM | POA: Diagnosis present

## 2024-10-23 DIAGNOSIS — Z833 Family history of diabetes mellitus: Secondary | ICD-10-CM

## 2024-10-23 DIAGNOSIS — I169 Hypertensive crisis, unspecified: Secondary | ICD-10-CM

## 2024-10-23 DIAGNOSIS — Z823 Family history of stroke: Secondary | ICD-10-CM

## 2024-10-23 DIAGNOSIS — Z882 Allergy status to sulfonamides status: Secondary | ICD-10-CM

## 2024-10-23 DIAGNOSIS — R29708 NIHSS score 8: Secondary | ICD-10-CM | POA: Diagnosis not present

## 2024-10-23 DIAGNOSIS — R000 Tachycardia, unspecified: Secondary | ICD-10-CM | POA: Diagnosis present

## 2024-10-23 DIAGNOSIS — Z79899 Other long term (current) drug therapy: Secondary | ICD-10-CM

## 2024-10-23 DIAGNOSIS — E119 Type 2 diabetes mellitus without complications: Secondary | ICD-10-CM | POA: Diagnosis not present

## 2024-10-23 DIAGNOSIS — Z90710 Acquired absence of both cervix and uterus: Secondary | ICD-10-CM

## 2024-10-23 DIAGNOSIS — I161 Hypertensive emergency: Secondary | ICD-10-CM | POA: Diagnosis present

## 2024-10-23 DIAGNOSIS — R471 Dysarthria and anarthria: Secondary | ICD-10-CM | POA: Diagnosis present

## 2024-10-23 DIAGNOSIS — Z791 Long term (current) use of non-steroidal anti-inflammatories (NSAID): Secondary | ICD-10-CM

## 2024-10-23 DIAGNOSIS — R451 Restlessness and agitation: Secondary | ICD-10-CM | POA: Diagnosis not present

## 2024-10-23 DIAGNOSIS — E1151 Type 2 diabetes mellitus with diabetic peripheral angiopathy without gangrene: Secondary | ICD-10-CM | POA: Diagnosis not present

## 2024-10-23 DIAGNOSIS — I160 Hypertensive urgency: Secondary | ICD-10-CM | POA: Diagnosis not present

## 2024-10-23 DIAGNOSIS — Z85820 Personal history of malignant melanoma of skin: Secondary | ICD-10-CM | POA: Diagnosis not present

## 2024-10-23 DIAGNOSIS — R1310 Dysphagia, unspecified: Secondary | ICD-10-CM | POA: Diagnosis present

## 2024-10-23 DIAGNOSIS — R29701 NIHSS score 1: Secondary | ICD-10-CM | POA: Diagnosis not present

## 2024-10-23 DIAGNOSIS — K3184 Gastroparesis: Secondary | ICD-10-CM | POA: Diagnosis present

## 2024-10-23 DIAGNOSIS — I69391 Dysphagia following cerebral infarction: Secondary | ICD-10-CM | POA: Diagnosis not present

## 2024-10-23 LAB — COMPREHENSIVE METABOLIC PANEL WITH GFR
ALT: 11 U/L (ref 0–44)
AST: 24 U/L (ref 15–41)
Albumin: 4.3 g/dL (ref 3.5–5.0)
Alkaline Phosphatase: 88 U/L (ref 38–126)
Anion gap: 13 (ref 5–15)
BUN: 6 mg/dL — ABNORMAL LOW (ref 8–23)
CO2: 23 mmol/L (ref 22–32)
Calcium: 10.5 mg/dL — ABNORMAL HIGH (ref 8.9–10.3)
Chloride: 102 mmol/L (ref 98–111)
Creatinine, Ser: 0.71 mg/dL (ref 0.44–1.00)
GFR, Estimated: >60 mL/min
Glucose, Bld: 152 mg/dL — ABNORMAL HIGH (ref 70–99)
Potassium: 3.4 mmol/L — ABNORMAL LOW (ref 3.5–5.1)
Sodium: 139 mmol/L (ref 135–145)
Total Bilirubin: 0.4 mg/dL (ref 0.0–1.2)
Total Protein: 8.2 g/dL — ABNORMAL HIGH (ref 6.5–8.1)

## 2024-10-23 LAB — PROTIME-INR
INR: 1 (ref 0.8–1.2)
Prothrombin Time: 13.6 s (ref 11.4–15.2)

## 2024-10-23 LAB — I-STAT CHEM 8, ED
BUN: 5 mg/dL — ABNORMAL LOW (ref 8–23)
Calcium, Ion: 1.19 mmol/L (ref 1.15–1.40)
Chloride: 104 mmol/L (ref 98–111)
Creatinine, Ser: 0.7 mg/dL (ref 0.44–1.00)
Glucose, Bld: 158 mg/dL — ABNORMAL HIGH (ref 70–99)
HCT: 41 % (ref 36.0–46.0)
Hemoglobin: 13.9 g/dL (ref 12.0–15.0)
Potassium: 3.2 mmol/L — ABNORMAL LOW (ref 3.5–5.1)
Sodium: 141 mmol/L (ref 135–145)
TCO2: 21 mmol/L — ABNORMAL LOW (ref 22–32)

## 2024-10-23 LAB — URINE DRUG SCREEN
Amphetamines: NEGATIVE
Barbiturates: NEGATIVE
Benzodiazepines: NEGATIVE
Cocaine: NEGATIVE
Fentanyl: NEGATIVE
Methadone Scn, Ur: NEGATIVE
Opiates: NEGATIVE
Tetrahydrocannabinol: NEGATIVE

## 2024-10-23 LAB — CBG MONITORING, ED
Glucose-Capillary: 148 mg/dL — ABNORMAL HIGH (ref 70–99)
Glucose-Capillary: 184 mg/dL — ABNORMAL HIGH (ref 70–99)

## 2024-10-23 LAB — CBC
HCT: 39.4 % (ref 36.0–46.0)
Hemoglobin: 12.9 g/dL (ref 12.0–15.0)
MCH: 27 pg (ref 26.0–34.0)
MCHC: 32.7 g/dL (ref 30.0–36.0)
MCV: 82.4 fL (ref 80.0–100.0)
Platelets: 292 K/uL (ref 150–400)
RBC: 4.78 MIL/uL (ref 3.87–5.11)
RDW: 13.9 % (ref 11.5–15.5)
WBC: 11.6 K/uL — ABNORMAL HIGH (ref 4.0–10.5)
nRBC: 0 % (ref 0.0–0.2)

## 2024-10-23 LAB — DIFFERENTIAL
Abs Immature Granulocytes: 0.04 K/uL (ref 0.00–0.07)
Basophils Absolute: 0.1 K/uL (ref 0.0–0.1)
Basophils Relative: 1 %
Eosinophils Absolute: 0.4 K/uL (ref 0.0–0.5)
Eosinophils Relative: 4 %
Immature Granulocytes: 0 %
Lymphocytes Relative: 29 %
Lymphs Abs: 3.3 K/uL (ref 0.7–4.0)
Monocytes Absolute: 0.9 K/uL (ref 0.1–1.0)
Monocytes Relative: 8 %
Neutro Abs: 6.8 K/uL (ref 1.7–7.7)
Neutrophils Relative %: 58 %

## 2024-10-23 LAB — APTT: aPTT: 32 s (ref 24–36)

## 2024-10-23 LAB — GLUCOSE, CAPILLARY
Glucose-Capillary: 234 mg/dL — ABNORMAL HIGH (ref 70–99)
Glucose-Capillary: 292 mg/dL — ABNORMAL HIGH (ref 70–99)

## 2024-10-23 LAB — MRSA NEXT GEN BY PCR, NASAL: MRSA by PCR Next Gen: NOT DETECTED

## 2024-10-23 LAB — ETHANOL: Alcohol, Ethyl (B): <15 mg/dL

## 2024-10-23 MED ORDER — ACETAMINOPHEN 325 MG PO TABS
650.0000 mg | ORAL_TABLET | ORAL | Status: DC | PRN
  Filled 2024-10-23 (×2): qty 2

## 2024-10-23 MED ORDER — LABETALOL HCL 5 MG/ML IV SOLN
INTRAVENOUS | Status: DC | PRN
  Administered 2024-10-23: 20 mg via INTRAVENOUS

## 2024-10-23 MED ORDER — ACETAMINOPHEN 160 MG/5ML PO SOLN: 650.0000 mg | ORAL | Status: DC | PRN

## 2024-10-23 MED ORDER — INSULIN ASPART 100 UNIT/ML IJ SOLN
0.0000 [IU] | INTRAMUSCULAR | Status: DC
  Administered 2024-10-23: 5 [IU] via SUBCUTANEOUS
  Administered 2024-10-23: 8 [IU] via SUBCUTANEOUS
  Administered 2024-10-23: 3 [IU] via SUBCUTANEOUS
  Administered 2024-10-24: 8 [IU] via SUBCUTANEOUS
  Administered 2024-10-24 (×4): 5 [IU] via SUBCUTANEOUS
  Administered 2024-10-24: 8 [IU] via SUBCUTANEOUS
  Administered 2024-10-25 (×2): 5 [IU] via SUBCUTANEOUS
  Filled 2024-10-23: qty 5
  Filled 2024-10-23: qty 3
  Filled 2024-10-23: qty 5
  Filled 2024-10-23: qty 8
  Filled 2024-10-23 (×2): qty 5
  Filled 2024-10-23: qty 8
  Filled 2024-10-23: qty 5
  Filled 2024-10-23: qty 8
  Filled 2024-10-23: qty 5

## 2024-10-23 MED ORDER — SODIUM CHLORIDE 0.9% FLUSH: 3.0000 mL | Freq: Once | INTRAVENOUS | Status: DC

## 2024-10-23 MED ORDER — SODIUM CHLORIDE 0.9 % IV SOLN: INTRAVENOUS | Status: AC

## 2024-10-23 MED ORDER — STROKE: EARLY STAGES OF RECOVERY BOOK
Freq: Once | Status: AC
  Administered 2024-10-24: 1
  Filled 2024-10-23: qty 1

## 2024-10-23 MED ORDER — PANTOPRAZOLE SODIUM 40 MG IV SOLR
40.0000 mg | Freq: Every day | INTRAVENOUS | Status: DC
  Administered 2024-10-23 – 2024-10-27 (×5): 40 mg via INTRAVENOUS
  Filled 2024-10-23 (×5): qty 10

## 2024-10-23 MED ORDER — MORPHINE SULFATE (PF) 2 MG/ML IV SOLN
1.0000 mg | Freq: Once | INTRAVENOUS | Status: AC
  Administered 2024-10-23: 1 mg via INTRAVENOUS
  Filled 2024-10-23: qty 1

## 2024-10-23 MED ORDER — ORAL CARE MOUTH RINSE: 15.0000 mL | OROMUCOSAL | Status: DC | PRN

## 2024-10-23 MED ORDER — CLEVIDIPINE BUTYRATE 0.5 MG/ML IV EMUL
0.0000 mg/h | INTRAVENOUS | Status: DC
  Administered 2024-10-23: 14 mg/h via INTRAVENOUS
  Administered 2024-10-23: 16 mg/h via INTRAVENOUS
  Administered 2024-10-23: 2 mg/h via INTRAVENOUS
  Administered 2024-10-23: 16 mg/h via INTRAVENOUS
  Administered 2024-10-24: 8 mg/h via INTRAVENOUS
  Administered 2024-10-24: 16 mg/h via INTRAVENOUS
  Administered 2024-10-24: 15 mg/h via INTRAVENOUS
  Administered 2024-10-24: 20 mg/h via INTRAVENOUS
  Administered 2024-10-24 (×2): 11 mg/h via INTRAVENOUS
  Administered 2024-10-24: 16 mg/h via INTRAVENOUS
  Administered 2024-10-25: 10 mg/h via INTRAVENOUS
  Administered 2024-10-25: 11 mg/h via INTRAVENOUS
  Filled 2024-10-23: qty 100
  Filled 2024-10-23: qty 50
  Filled 2024-10-23 (×3): qty 100
  Filled 2024-10-23: qty 50
  Filled 2024-10-23 (×6): qty 100

## 2024-10-23 MED ORDER — CHLORHEXIDINE GLUCONATE CLOTH 2 % EX PADS
6.0000 | MEDICATED_PAD | Freq: Every day | CUTANEOUS | Status: DC
  Administered 2024-10-23 – 2024-10-28 (×6): 6 via TOPICAL

## 2024-10-23 MED ORDER — TENECTEPLASE 25 MG IV KIT
25.0000 mg | PACK | Freq: Once | INTRAVENOUS | Status: AC
  Administered 2024-10-23: 25 mg via INTRAVENOUS
  Filled 2024-10-23: qty 5

## 2024-10-23 MED ORDER — ONDANSETRON HCL 4 MG/2ML IJ SOLN
4.0000 mg | Freq: Once | INTRAMUSCULAR | Status: AC
  Administered 2024-10-23: 4 mg via INTRAVENOUS
  Filled 2024-10-23: qty 2

## 2024-10-23 MED ORDER — IOHEXOL 350 MG/ML SOLN
75.0000 mL | Freq: Once | INTRAVENOUS | Status: AC | PRN
  Administered 2024-10-23: 75 mL via INTRAVENOUS

## 2024-10-23 MED ORDER — ACETAMINOPHEN 650 MG RE SUPP
650.0000 mg | RECTAL | Status: DC | PRN
  Administered 2024-10-23 – 2024-10-24 (×2): 650 mg via RECTAL
  Filled 2024-10-23 (×3): qty 1

## 2024-10-23 MED ORDER — SENNOSIDES-DOCUSATE SODIUM 8.6-50 MG PO TABS
1.0000 | ORAL_TABLET | Freq: Every evening | ORAL | Status: DC | PRN
  Administered 2024-10-25: 1 via ORAL
  Filled 2024-10-23: qty 1

## 2024-10-23 MED ORDER — LABETALOL HCL 5 MG/ML IV SOLN
20.0000 mg | Freq: Once | INTRAVENOUS | Status: AC
  Administered 2024-10-23: 20 mg via INTRAVENOUS
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
## 2024-10-23 NOTE — Progress Notes (Signed)
 PHARMACIST CODE STROKE RESPONSE  Notified to mix TNK at 1618 by Dr. Rosemarie TNK preparation completed at 1620 BP too high for TNK administration at time of TNK ready. Labetalol and clevidipine >> BP ok at 1627  TNK dose = 25 mg IV over 5 seconds  Issues/delays encountered (if applicable): n/a  Jamie Burke 10/23/2024 4:27 PM
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
## 2024-10-24 ENCOUNTER — Inpatient Hospital Stay (HOSPITAL_COMMUNITY)

## 2024-10-24 ENCOUNTER — Other Ambulatory Visit: Payer: Self-pay

## 2024-10-24 DIAGNOSIS — E785 Hyperlipidemia, unspecified: Secondary | ICD-10-CM | POA: Diagnosis not present

## 2024-10-24 DIAGNOSIS — I6389 Other cerebral infarction: Secondary | ICD-10-CM | POA: Diagnosis not present

## 2024-10-24 DIAGNOSIS — I69391 Dysphagia following cerebral infarction: Secondary | ICD-10-CM

## 2024-10-24 DIAGNOSIS — I10 Essential (primary) hypertension: Secondary | ICD-10-CM | POA: Diagnosis not present

## 2024-10-24 DIAGNOSIS — Z794 Long term (current) use of insulin: Secondary | ICD-10-CM | POA: Diagnosis not present

## 2024-10-24 DIAGNOSIS — E119 Type 2 diabetes mellitus without complications: Secondary | ICD-10-CM | POA: Diagnosis not present

## 2024-10-24 DIAGNOSIS — I161 Hypertensive emergency: Secondary | ICD-10-CM

## 2024-10-24 DIAGNOSIS — I63411 Cerebral infarction due to embolism of right middle cerebral artery: Secondary | ICD-10-CM

## 2024-10-24 DIAGNOSIS — R569 Unspecified convulsions: Secondary | ICD-10-CM | POA: Diagnosis not present

## 2024-10-24 DIAGNOSIS — R29710 NIHSS score 10: Secondary | ICD-10-CM | POA: Diagnosis not present

## 2024-10-24 LAB — ECHOCARDIOGRAM COMPLETE
AR max vel: 2.43 cm2
AV Area VTI: 2.59 cm2
AV Area mean vel: 2.54 cm2
AV Mean grad: 6.3 mmHg
AV Peak grad: 11.6 mmHg
Ao pk vel: 1.7 m/s
S' Lateral: 2.5 cm
Weight: 3703.73 [oz_av]

## 2024-10-24 LAB — URINALYSIS, ROUTINE W REFLEX MICROSCOPIC
Bacteria, UA: NONE SEEN
Bilirubin Urine: NEGATIVE
Glucose, UA: 50 mg/dL — AB
Ketones, ur: 5 mg/dL — AB
Leukocytes,Ua: NEGATIVE
Nitrite: NEGATIVE
Protein, ur: 100 mg/dL — AB
Specific Gravity, Urine: 1.017 (ref 1.005–1.030)
pH: 5 (ref 5.0–8.0)

## 2024-10-24 LAB — CBC
HCT: 37.9 % (ref 36.0–46.0)
Hemoglobin: 12.9 g/dL (ref 12.0–15.0)
MCH: 27.8 pg (ref 26.0–34.0)
MCHC: 34 g/dL (ref 30.0–36.0)
MCV: 81.7 fL (ref 80.0–100.0)
Platelets: 275 K/uL (ref 150–400)
RBC: 4.64 MIL/uL (ref 3.87–5.11)
RDW: 14.3 % (ref 11.5–15.5)
WBC: 18.5 K/uL — ABNORMAL HIGH (ref 4.0–10.5)
nRBC: 0 % (ref 0.0–0.2)

## 2024-10-24 LAB — COMPREHENSIVE METABOLIC PANEL WITH GFR
ALT: 10 U/L (ref 0–44)
AST: 34 U/L (ref 15–41)
Albumin: 4.2 g/dL (ref 3.5–5.0)
Alkaline Phosphatase: 87 U/L (ref 38–126)
Anion gap: 18 — ABNORMAL HIGH (ref 5–15)
BUN: 9 mg/dL (ref 8–23)
CO2: 19 mmol/L — ABNORMAL LOW (ref 22–32)
Calcium: 10.2 mg/dL (ref 8.9–10.3)
Chloride: 98 mmol/L (ref 98–111)
Creatinine, Ser: 0.83 mg/dL (ref 0.44–1.00)
GFR, Estimated: >60 mL/min
Glucose, Bld: 294 mg/dL — ABNORMAL HIGH (ref 70–99)
Potassium: 4.4 mmol/L (ref 3.5–5.1)
Sodium: 134 mmol/L — ABNORMAL LOW (ref 135–145)
Total Bilirubin: 0.4 mg/dL (ref 0.0–1.2)
Total Protein: 8.1 g/dL (ref 6.5–8.1)

## 2024-10-24 LAB — LIPID PANEL
Cholesterol: 215 mg/dL — ABNORMAL HIGH (ref 0–200)
HDL: 34 mg/dL — ABNORMAL LOW
LDL Cholesterol: UNDETERMINED mg/dL (ref 0–99)
Total CHOL/HDL Ratio: 6.3 ratio
Triglycerides: 414 mg/dL — ABNORMAL HIGH
VLDL: 83 mg/dL — ABNORMAL HIGH (ref 0–40)

## 2024-10-24 LAB — GLUCOSE, CAPILLARY
Glucose-Capillary: 272 mg/dL — ABNORMAL HIGH (ref 70–99)
Glucose-Capillary: 260 mg/dL — ABNORMAL HIGH (ref 70–99)
Glucose-Capillary: 247 mg/dL — ABNORMAL HIGH (ref 70–99)
Glucose-Capillary: 266 mg/dL — ABNORMAL HIGH (ref 70–99)
Glucose-Capillary: 237 mg/dL — ABNORMAL HIGH (ref 70–99)
Glucose-Capillary: 202 mg/dL — ABNORMAL HIGH (ref 70–99)

## 2024-10-24 LAB — PROTIME-INR
INR: 1.2 (ref 0.8–1.2)
Prothrombin Time: 15.4 s — ABNORMAL HIGH (ref 11.4–15.2)

## 2024-10-24 LAB — MAGNESIUM: Magnesium: 1.6 mg/dL — ABNORMAL LOW (ref 1.7–2.4)

## 2024-10-24 LAB — LDL CHOLESTEROL, DIRECT: Direct LDL: 140 mg/dL — ABNORMAL HIGH (ref 0–99)

## 2024-10-24 LAB — POTASSIUM: Potassium: 3.5 mmol/L (ref 3.5–5.1)

## 2024-10-24 MED ORDER — AMLODIPINE BESYLATE 10 MG PO TABS
10.0000 mg | ORAL_TABLET | Freq: Every day | ORAL | Status: DC
  Administered 2024-10-25 – 2024-10-28 (×4): 10 mg via ORAL
  Filled 2024-10-24 (×5): qty 1

## 2024-10-24 MED ORDER — INFLUENZA VAC SPLIT HIGH-DOSE 0.5 ML IM SUSY
0.5000 mL | PREFILLED_SYRINGE | INTRAMUSCULAR | Status: DC
  Filled 2024-10-24: qty 0.5

## 2024-10-24 MED ORDER — INSULIN GLARGINE 100 UNIT/ML ~~LOC~~ SOLN
10.0000 [IU] | Freq: Two times a day (BID) | SUBCUTANEOUS | Status: DC
  Administered 2024-10-24 – 2024-10-27 (×6): 10 [IU] via SUBCUTANEOUS
  Filled 2024-10-24 (×9): qty 0.1

## 2024-10-24 MED ORDER — HYDRALAZINE HCL 20 MG/ML IJ SOLN
10.0000 mg | INTRAMUSCULAR | Status: DC | PRN
  Administered 2024-10-24 (×2): 20 mg via INTRAVENOUS
  Filled 2024-10-24 (×3): qty 1

## 2024-10-24 MED ORDER — ROSUVASTATIN CALCIUM 20 MG PO TABS: 40.0000 mg | ORAL_TABLET | Freq: Every day | ORAL | Status: DC

## 2024-10-24 MED ORDER — SODIUM CHLORIDE 0.9 % IV SOLN
3.0000 g | Freq: Four times a day (QID) | INTRAVENOUS | Status: DC
  Administered 2024-10-24 – 2024-10-28 (×16): 3 g via INTRAVENOUS
  Filled 2024-10-24 (×17): qty 8

## 2024-10-24 MED ORDER — HALOPERIDOL LACTATE 5 MG/ML IJ SOLN: 2.0000 mg | Freq: Once | INTRAMUSCULAR | Status: DC

## 2024-10-24 MED ORDER — PERFLUTREN LIPID MICROSPHERE
1.0000 mL | INTRAVENOUS | Status: AC | PRN
  Administered 2024-10-24: 3 mL via INTRAVENOUS

## 2024-10-24 MED ORDER — MAGNESIUM SULFATE 4 GM/100ML IV SOLN
4.0000 g | Freq: Once | INTRAVENOUS | Status: AC
  Administered 2024-10-25: 4 g via INTRAVENOUS
  Filled 2024-10-24: qty 100

## 2024-10-24 MED ORDER — LABETALOL HCL 5 MG/ML IV SOLN: 10.0000 mg | INTRAVENOUS | Status: DC | PRN

## 2024-10-24 MED ORDER — LORAZEPAM 2 MG/ML IJ SOLN
1.0000 mg | Freq: Once | INTRAMUSCULAR | Status: AC
  Administered 2024-10-24: 1 mg via INTRAVENOUS
  Filled 2024-10-24: qty 1

## 2024-10-24 MED ORDER — METOPROLOL TARTRATE 25 MG PO TABS
12.5000 mg | ORAL_TABLET | Freq: Two times a day (BID) | ORAL | Status: DC
  Administered 2024-10-24: 12.5 mg via ORAL
  Filled 2024-10-24 (×2): qty 1
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
## 2024-10-24 NOTE — Progress Notes (Signed)
 30 day monitor ordered for CVA. [place] to read.  Monitor to be mailed to home.
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
## 2024-10-24 NOTE — Progress Notes (Signed)
 OT Cancellation Note  Patient Details Name: Jamie Burke MRN: [id number] DOB: October 21, 1946   Cancelled Treatment:    Reason Eval/Treat Not Completed: Active bedrest order  Donny BECKER OT Acute Rehabilitation Services Office [phone]    [place] Dorothyann Distel 10/24/2024, 4:00 PM
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
## 2024-10-24 NOTE — Progress Notes (Signed)
 EEG complete - results pending

## 2024-10-24 NOTE — Progress Notes (Signed)
 PT Cancellation Note  Patient Details Name: Jamie Burke MRN: [id number] DOB: 21-Apr-1946   Cancelled Treatment:    Reason Eval/Treat Not Completed: Active bedrest order   Lenoard NOVAK Kanon Colunga 10/24/2024, 7:23 AM Lenoard SQUIBB, PT Acute Rehabilitation Services Office: [phone]

## 2024-10-24 NOTE — Progress Notes (Signed)
 LTM EEG hooked up and running - no initial skin breakdown - push button tested - [facility] monitoring.

## 2024-10-25 ENCOUNTER — Inpatient Hospital Stay (HOSPITAL_COMMUNITY)

## 2024-10-25 DIAGNOSIS — I6340 Cerebral infarction due to embolism of unspecified cerebral artery: Secondary | ICD-10-CM

## 2024-10-25 DIAGNOSIS — R569 Unspecified convulsions: Secondary | ICD-10-CM

## 2024-10-25 DIAGNOSIS — E1151 Type 2 diabetes mellitus with diabetic peripheral angiopathy without gangrene: Secondary | ICD-10-CM

## 2024-10-25 DIAGNOSIS — G936 Cerebral edema: Secondary | ICD-10-CM

## 2024-10-25 DIAGNOSIS — I160 Hypertensive urgency: Secondary | ICD-10-CM

## 2024-10-25 DIAGNOSIS — Z794 Long term (current) use of insulin: Secondary | ICD-10-CM | POA: Diagnosis not present

## 2024-10-25 DIAGNOSIS — I69391 Dysphagia following cerebral infarction: Secondary | ICD-10-CM | POA: Diagnosis not present

## 2024-10-25 DIAGNOSIS — R29710 NIHSS score 10: Secondary | ICD-10-CM | POA: Diagnosis not present

## 2024-10-25 DIAGNOSIS — E785 Hyperlipidemia, unspecified: Secondary | ICD-10-CM | POA: Diagnosis not present

## 2024-10-25 DIAGNOSIS — R29701 NIHSS score 1: Secondary | ICD-10-CM | POA: Diagnosis not present

## 2024-10-25 DIAGNOSIS — I10 Essential (primary) hypertension: Secondary | ICD-10-CM | POA: Diagnosis not present

## 2024-10-25 DIAGNOSIS — G9389 Other specified disorders of brain: Secondary | ICD-10-CM | POA: Diagnosis not present

## 2024-10-25 LAB — COMPREHENSIVE METABOLIC PANEL WITH GFR
ALT: 12 U/L (ref 0–44)
AST: 37 U/L (ref 15–41)
Albumin: 4 g/dL (ref 3.5–5.0)
Alkaline Phosphatase: 73 U/L (ref 38–126)
Anion gap: 12 (ref 5–15)
BUN: 18 mg/dL (ref 8–23)
CO2: 24 mmol/L (ref 22–32)
Calcium: 9.8 mg/dL (ref 8.9–10.3)
Chloride: 102 mmol/L (ref 98–111)
Creatinine, Ser: 1 mg/dL (ref 0.44–1.00)
GFR, Estimated: 58 mL/min — ABNORMAL LOW
Glucose, Bld: 206 mg/dL — ABNORMAL HIGH (ref 70–99)
Potassium: 3.4 mmol/L — ABNORMAL LOW (ref 3.5–5.1)
Sodium: 138 mmol/L (ref 135–145)
Total Bilirubin: 0.4 mg/dL (ref 0.0–1.2)
Total Protein: 7.4 g/dL (ref 6.5–8.1)

## 2024-10-25 LAB — RESPIRATORY PANEL BY PCR

## 2024-10-25 LAB — CBC
HCT: 38.6 % (ref 36.0–46.0)
Hemoglobin: 12.8 g/dL (ref 12.0–15.0)
MCH: 27 pg (ref 26.0–34.0)
MCHC: 33.2 g/dL (ref 30.0–36.0)
MCV: 81.4 fL (ref 80.0–100.0)
Platelets: 275 K/uL (ref 150–400)
RBC: 4.74 MIL/uL (ref 3.87–5.11)
RDW: 14.6 % (ref 11.5–15.5)
WBC: 16.6 K/uL — ABNORMAL HIGH (ref 4.0–10.5)
nRBC: 0 % (ref 0.0–0.2)

## 2024-10-25 LAB — GLUCOSE, CAPILLARY
Glucose-Capillary: 228 mg/dL — ABNORMAL HIGH (ref 70–99)
Glucose-Capillary: 224 mg/dL — ABNORMAL HIGH (ref 70–99)
Glucose-Capillary: 207 mg/dL — ABNORMAL HIGH (ref 70–99)
Glucose-Capillary: 172 mg/dL — ABNORMAL HIGH (ref 70–99)
Glucose-Capillary: 160 mg/dL — ABNORMAL HIGH (ref 70–99)
Glucose-Capillary: 161 mg/dL — ABNORMAL HIGH (ref 70–99)

## 2024-10-25 LAB — PHOSPHORUS: Phosphorus: 3.2 mg/dL (ref 2.5–4.6)

## 2024-10-25 MED ORDER — GADOBUTROL 1 MMOL/ML IV SOLN
9.0000 mL | Freq: Once | INTRAVENOUS | Status: AC | PRN
  Administered 2024-10-25: 9 mL via INTRAVENOUS

## 2024-10-25 MED ORDER — LORAZEPAM 2 MG/ML IJ SOLN
1.0000 mg | Freq: Once | INTRAMUSCULAR | Status: DC
  Filled 2024-10-25: qty 1

## 2024-10-25 MED ORDER — INSULIN ASPART 100 UNIT/ML IJ SOLN
0.0000 [IU] | INTRAMUSCULAR | Status: DC
  Administered 2024-10-25: 4 [IU] via SUBCUTANEOUS
  Administered 2024-10-25: 7 [IU] via SUBCUTANEOUS
  Administered 2024-10-25 – 2024-10-26 (×3): 4 [IU] via SUBCUTANEOUS
  Administered 2024-10-26: 7 [IU] via SUBCUTANEOUS
  Filled 2024-10-25: qty 3
  Filled 2024-10-25: qty 4
  Filled 2024-10-25: qty 5
  Filled 2024-10-25: qty 4
  Filled 2024-10-25: qty 5
  Filled 2024-10-25 (×2): qty 4

## 2024-10-25 MED ORDER — OSMOLITE 1.5 CAL PO LIQD
1000.0000 mL | ORAL | Status: DC
  Administered 2024-10-25 – 2024-10-26 (×2): 1000 mL

## 2024-10-25 MED ORDER — ENOXAPARIN SODIUM 60 MG/0.6ML IJ SOSY
50.0000 mg | PREFILLED_SYRINGE | INTRAMUSCULAR | Status: DC
  Administered 2024-10-25 – 2024-10-28 (×4): 50 mg via SUBCUTANEOUS
  Filled 2024-10-25 (×5): qty 0.6

## 2024-10-25 MED ORDER — BETHANECHOL CHLORIDE 10 MG PO TABS
10.0000 mg | ORAL_TABLET | Freq: Three times a day (TID) | ORAL | Status: DC
  Administered 2024-10-25 – 2024-10-27 (×7): 10 mg via ORAL
  Filled 2024-10-25 (×7): qty 1

## 2024-10-25 MED ORDER — ORAL CARE MOUTH RINSE: 15.0000 mL | OROMUCOSAL | Status: DC | PRN

## 2024-10-25 MED ORDER — POTASSIUM CHLORIDE CRYS ER 20 MEQ PO TBCR
40.0000 meq | EXTENDED_RELEASE_TABLET | Freq: Once | ORAL | Status: DC
  Filled 2024-10-25: qty 2

## 2024-10-25 MED ORDER — METOPROLOL TARTRATE 25 MG PO TABS
25.0000 mg | ORAL_TABLET | Freq: Two times a day (BID) | ORAL | Status: DC
  Administered 2024-10-25 – 2024-10-28 (×7): 25 mg via ORAL
  Filled 2024-10-25 (×7): qty 1

## 2024-10-25 MED ORDER — ASPIRIN 325 MG PO TABS
325.0000 mg | ORAL_TABLET | Freq: Every day | ORAL | Status: DC
  Administered 2024-10-25 – 2024-10-27 (×3): 325 mg
  Filled 2024-10-25 (×3): qty 1

## 2024-10-25 MED ORDER — POTASSIUM CHLORIDE 10 MEQ/100ML IV SOLN
10.0000 meq | INTRAVENOUS | Status: AC
  Administered 2024-10-25 (×4): 10 meq via INTRAVENOUS
  Filled 2024-10-25 (×4): qty 100

## 2024-10-25 MED ORDER — ORAL CARE MOUTH RINSE
15.0000 mL | OROMUCOSAL | Status: DC
  Administered 2024-10-26 – 2024-10-28 (×10): 15 mL via OROMUCOSAL

## 2024-10-25 MED ORDER — CARMEX CLASSIC LIP BALM EX OINT: TOPICAL_OINTMENT | CUTANEOUS | Status: DC | PRN

## 2024-10-25 MED ORDER — PROSOURCE TF20 ENFIT COMPATIBL EN LIQD
60.0000 mL | Freq: Two times a day (BID) | ENTERAL | Status: DC
  Administered 2024-10-25 – 2024-10-26 (×3): 60 mL
  Filled 2024-10-25 (×3): qty 60

## 2024-10-25 NOTE — Progress Notes (Signed)
  Inpatient Rehab Admissions Coordinator :  Per therapy recommendations, patient was screened for CIR candidacy by Ottie Glazier RN MSN.  At this time patient appears to be a potential candidate for CIR. I will place a rehab consult per protocol for full assessment. Please call me with any questions.  Ottie Glazier RN MSN Admissions Coordinator [phone]

## 2024-10-25 NOTE — Inpatient Diabetes Management (Signed)
 Inpatient Diabetes Program Recommendations  AACE/ADA: New Consensus Statement on Inpatient Glycemic Control (2015)  Target Ranges:  Prepandial:   less than 140 mg/dL      Peak postprandial:   less than 180 mg/dL (1-2 hours)      Critically ill patients:  140 - 180 mg/dL   Lab Results  Component Value Date   GLUCAP 207 (H) 10/25/2024   HGBA1C 8.5 (A) 08/12/2024    Review of Glycemic Control  Latest Reference Range & Units 10/24/24 08:18 10/24/24 11:31 10/24/24 15:17 10/24/24 19:32 10/24/24 23:12 10/25/24 03:17 10/25/24 07:46 10/25/24 11:40  Glucose-Capillary 70 - 99 mg/dL 739 (H) 752 (H) 733 (H) 237 (H) 202 (H) 228 (H) 224 (H) 207 (H)  (H): Data is abnormally high  Diabetes history: DM2 Outpatient Diabetes medications:  75/25 70 units QAM, 60 units QPM Current orders for Inpatient glycemic control:  Lantus 10 units BID Novolog 0-20 units Q4H  Inpatient Diabetes Program Recommendations:    Please consider:  Lantus 14 units BID  Thank you, Wyvonna Pinal, MSN, CDCES Diabetes Coordinator Inpatient Diabetes Program [phone] (team pager from 8a-5p)
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
## 2024-10-25 NOTE — Progress Notes (Signed)
 PT Cancellation Note  Patient Details Name: KEYUANA WANK MRN: [id number] DOB: 21-Jul-1946   Cancelled Treatment:    Reason Eval/Treat Not Completed: Active bedrest order   Lenoard NOVAK Drevon Plog 10/25/2024, 6:48 AM Lenoard SQUIBB, PT Acute Rehabilitation Services Office: [phone]
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
## 2024-10-25 NOTE — Progress Notes (Signed)
 vLTM maintenance  All impedances below 10k  No skin breakdown noted at FP1  CZ PZ F4

## 2024-10-25 NOTE — Progress Notes (Signed)
 OT Cancellation Note  Patient Details Name: Jamie Burke MRN: [id number] DOB: 1946-05-03   Cancelled Treatment:    Reason Eval/Treat Not Completed: Active bedrest order OT order received and appreciated however this conflicts with current bedrest order set. Please increase activity tolerance as appropriate and remove bedrest from orders. . Please contact OT at [phone] if bed rest order is discontinued. OT will hold evaluation at this time and will check back as time allows pending increased activity orders.   Ely Molt 10/25/2024, 6:25 AM

## 2024-10-25 NOTE — Plan of Care (Signed)
" °  Problem: Coping: Goal: Will verbalize positive feelings about self Outcome: Not Progressing Goal: Will identify appropriate support needs Outcome: Not Progressing   Problem: Health Behavior/Discharge Planning: Goal: Ability to manage health-related needs will improve Outcome: Not Progressing   Problem: Self-Care: Goal: Ability to participate in self-care as condition permits will improve Outcome: Not Progressing Goal: Ability to communicate needs accurately will improve Outcome: Not Progressing   Problem: Nutrition: Goal: Dietary intake will improve Outcome: Not Progressing   Problem: Coping: Goal: Ability to adjust to condition or change in health will improve Outcome: Not Progressing   Problem: Metabolic: Goal: Ability to maintain appropriate glucose levels will improve Outcome: Not Progressing   Problem: Education: Goal: Knowledge of General Education information will improve Description: Including pain rating scale, medication(s)/side effects and non-pharmacologic comfort measures Outcome: Not Progressing   "

## 2024-10-25 NOTE — TOC CAGE-AID Note (Signed)
 Transition of Care [facility]) - CAGE-AID Screening   Patient Details  Name: Jamie Burke MRN: [id number] Date of Birth: 08-Feb-1946  Transition of [facility]) CM/SW Contact:    Inocente GORMAN Kindle, LCSW Phone Number: 10/25/2024, 10:17 AM   Clinical Narrative: Patient currently disoriented and unable to participate in screening at this time.    CAGE-AID Screening: Substance Abuse Screening unable to be completed due to: : Patient unable to participate

## 2024-10-25 NOTE — Procedures (Signed)
 Cortrak  Person Inserting Tube:  Jamie Burke, [name], RD Tube Type:  Cortrak - 43 inches Tube Size:  10 Tube Location:  Left nare Secured by: Bridle Initial Placement:  Gastric Technique Used to Measure Tube Placement:  Marking at nare/corner of mouth Cortrak Secured At:  59 cm Initial Placement Verification:  Cortrak device (Registered Dieticians Only)  Cortrak Tube Team Note:  Consult received to place a Cortrak feeding tube.   No x-ray is required. RN may begin using tube.   If the tube becomes dislodged please keep the tube and contact the Cortrak team at [email or web address] for replacement.  If after hours and replacement cannot be delayed, place a NG tube and confirm placement with an abdominal x-ray.    Olivia Kenning, RD Registered Dietitian  See Amion for more information

## 2024-10-25 NOTE — Plan of Care (Signed)
" °  Problem: Education: Goal: Knowledge of secondary prevention will improve (MUST DOCUMENT ALL) Outcome: Progressing   Problem: Ischemic Stroke/TIA Tissue Perfusion: Goal: Complications of ischemic stroke/TIA will be minimized Outcome: Progressing   Problem: Coping: Goal: Will verbalize positive feelings about self Outcome: Progressing Goal: Will identify appropriate support needs Outcome: Progressing   Problem: Health Behavior/Discharge Planning: Goal: Ability to manage health-related needs will improve Outcome: Progressing Goal: Goals will be collaboratively established with patient/family Outcome: Progressing   Problem: Self-Care: Goal: Ability to participate in self-care as condition permits will improve Outcome: Progressing Goal: Verbalization of feelings and concerns over difficulty with self-care will improve Outcome: Progressing Goal: Ability to communicate needs accurately will improve Outcome: Progressing   Problem: Nutrition: Goal: Risk of aspiration will decrease Outcome: Progressing Goal: Dietary intake will improve Outcome: Progressing   Problem: Education: Goal: Ability to describe self-care measures that may prevent or decrease complications (Diabetes Survival Skills Education) will improve Outcome: Progressing Goal: Individualized Educational Video(s) Outcome: Progressing   Problem: Coping: Goal: Ability to adjust to condition or change in health will improve Outcome: Progressing   Problem: Fluid Volume: Goal: Ability to maintain a balanced intake and output will improve Outcome: Progressing   Problem: Health Behavior/Discharge Planning: Goal: Ability to identify and utilize available resources and services will improve Outcome: Progressing Goal: Ability to manage health-related needs will improve Outcome: Progressing   Problem: Metabolic: Goal: Ability to maintain appropriate glucose levels will improve Outcome: Progressing   Problem:  Nutritional: Goal: Maintenance of adequate nutrition will improve Outcome: Progressing Goal: Progress toward achieving an optimal weight will improve Outcome: Progressing   Problem: Skin Integrity: Goal: Risk for impaired skin integrity will decrease Outcome: Progressing   Problem: Tissue Perfusion: Goal: Adequacy of tissue perfusion will improve Outcome: Progressing   Problem: Education: Goal: Knowledge of General Education information will improve Description: Including pain rating scale, medication(s)/side effects and non-pharmacologic comfort measures Outcome: Progressing   Problem: Health Behavior/Discharge Planning: Goal: Ability to manage health-related needs will improve Outcome: Progressing   Problem: Clinical Measurements: Goal: Ability to maintain clinical measurements within normal limits will improve Outcome: Progressing Goal: Will remain free from infection Outcome: Progressing Goal: Diagnostic test results will improve Outcome: Progressing Goal: Respiratory complications will improve Outcome: Progressing Goal: Cardiovascular complication will be avoided Outcome: Progressing   Problem: Activity: Goal: Risk for activity intolerance will decrease Outcome: Progressing   Problem: Nutrition: Goal: Adequate nutrition will be maintained Outcome: Progressing   Problem: Coping: Goal: Level of anxiety will decrease Outcome: Progressing   Problem: Elimination: Goal: Will not experience complications related to bowel motility Outcome: Progressing Goal: Will not experience complications related to urinary retention Outcome: Progressing   Problem: Pain Managment: Goal: General experience of comfort will improve and/or be controlled Outcome: Progressing   Problem: Safety: Goal: Ability to remain free from injury will improve Outcome: Progressing   Problem: Skin Integrity: Goal: Risk for impaired skin integrity will decrease Outcome: Progressing   "
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
## 2024-10-25 NOTE — Progress Notes (Signed)
 LTM VIDEO EEG discontinued - no skin breakdown at [facility]. Tech left the patient at beside in recliner, as she was upon entering the room. Family was in the room.
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
## 2024-10-25 NOTE — TOC Initial Note (Signed)
 Transition of Care [facility]) - Initial/Assessment Note    Patient Details  Name: Jamie Burke MRN: [id number] Date of Birth: 23-Apr-1946  [facility] of [facility]) CM/SW Contact:    Inocente GORMAN Kindle, LCSW Phone Number: 10/25/2024, 5:08 PM  Clinical Narrative:                 Patient from home with spouse undergoing stroke workup with Cortrak. Will continue to follow for rehab needs: CIR vs. SNF.     Barriers to Discharge: English As A Second Language Teacher, Continued Medical Work up   Patient Goals and CMS Choice Patient states their goals for this hospitalization and ongoing recovery are:: Rehab          Expected Discharge Plan and Services In-house Referral: Clinical Social Work     Living arrangements for the past 2 months: Single Family Home                                      Prior Living Arrangements/Services Living arrangements for the past 2 months: Single Family Home Lives with:: Spouse Patient language and need for interpreter reviewed:: Yes Do you feel safe going back to the place where you live?: Yes      Need for Family Participation in Patient Care: Yes (Comment) Care giver support system in place?: Yes (comment)   Criminal Activity/Legal Involvement Pertinent to Current Situation/Hospitalization: No - Comment as needed  Activities of Daily Living   ADL Screening (condition at time of admission) Independently performs ADLs?: No Does the patient have a NEW difficulty with bathing/dressing/toileting/self-feeding that is expected to last >3 days?: Yes (Initiates electronic notice to provider for possible OT consult) Does the patient have a NEW difficulty with getting in/out of bed, walking, or climbing stairs that is expected to last >3 days?: Yes (Initiates electronic notice to provider for possible PT consult) Does the patient have a NEW difficulty with communication that is expected to last >3 days?: Yes (Initiates electronic notice to provider for possible SLP  consult) Is the patient deaf or have difficulty hearing?: Yes Does the patient have difficulty seeing, even when wearing glasses/contacts?: No Does the patient have difficulty concentrating, remembering, or making decisions?: Yes  Permission Sought/Granted Permission sought to share information with : Facility Medical Sales Representative, Family Supports Permission granted to share information with : No  Share Information with NAMEPOPPI, SCANTLING -Daughter   [phone]           Emotional Assessment Appearance:: Appears stated age Attitude/Demeanor/Rapport: Unable to Assess Affect (typically observed): Unable to Assess Orientation: : Oriented to Self, Oriented to Place Alcohol / Substance Use: Not Applicable Psych Involvement: No (comment)  Admission diagnosis:  Stroke [facility]) [I63.9] Cerebrovascular accident (CVA), unspecified mechanism (HCC) [I63.9] Patient Active Problem List   Diagnosis Date Noted   Stroke (HCC) 10/23/2024   Snoring 03/28/2022   Tachycardia 03/28/2022   Polyneuropathy associated with underlying disease 07/28/2021   Type 2 diabetes mellitus with diabetic polyneuropathy, with long-term current use of insulin  (HCC) 12/25/2019   Dyslipidemia 12/25/2019   Diabetes mellitus (HCC) 02/27/2019   Insomnia 02/27/2019   Atypical chest pain 08/20/2018   RBBB 08/20/2018   Bilateral sensorineural hearing loss 01/13/2016   Subjective tinnitus of left ear 01/13/2016   Rotator cuff injury 05/23/2011   Seasonal allergies 05/23/2011   Lesion of nose 05/23/2011   Seasonal allergic rhinitis 05/23/2011   Hyperlipidemia with target low density lipoprotein (LDL)  cholesterol less than 100 mg/dL 93/85/7987   Obesity 93/85/7987   HTN (hypertension) 03/15/2011   Diabetes mellitus, type II, insulin  dependent (HCC) 03/15/2011   PCP:  Sun, Vyvyan, MD Pharmacy:   [facility] [address] ([place]), [place] - [address] [place]) [place]  [address] Phone: [phone] Fax: [phone]  [facility]) - [place], [place] - [address] [facility] [address] [place] [address] [place] [address] Phone: [phone] Fax: [phone]  [facility] - [place] [place] - [address] [place] [address] Phone: [phone] Fax: [phone]  [facility] [place], [place] - [address] [place] [address] Phone: [phone] Fax: [phone]     Social Drivers of Health (SDOH) Social History: SDOH Screenings   Food Insecurity: No Food Insecurity (10/24/2024)  Housing: Low Risk (10/24/2024)  Transportation Needs: No Transportation Needs (10/24/2024)  Utilities: Not At Risk (10/24/2024)  Depression (PHQ2-9): Low Risk (06/03/2024)  Social Connections: Socially Integrated (10/24/2024)  Tobacco Use: Low Risk (10/23/2024)   SDOH Interventions:     Readmission Risk Interventions     No data to display

## 2024-10-26 DIAGNOSIS — G9389 Other specified disorders of brain: Secondary | ICD-10-CM | POA: Diagnosis not present

## 2024-10-26 DIAGNOSIS — I6340 Cerebral infarction due to embolism of unspecified cerebral artery: Secondary | ICD-10-CM | POA: Diagnosis not present

## 2024-10-26 DIAGNOSIS — R29710 NIHSS score 10: Secondary | ICD-10-CM | POA: Diagnosis not present

## 2024-10-26 DIAGNOSIS — R569 Unspecified convulsions: Secondary | ICD-10-CM | POA: Diagnosis not present

## 2024-10-26 LAB — COMPREHENSIVE METABOLIC PANEL WITH GFR
ALT: 14 U/L (ref 0–44)
AST: 37 U/L (ref 15–41)
Albumin: 3.6 g/dL (ref 3.5–5.0)
Alkaline Phosphatase: 55 U/L (ref 38–126)
Anion gap: 11 (ref 5–15)
BUN: 22 mg/dL (ref 8–23)
CO2: 21 mmol/L — ABNORMAL LOW (ref 22–32)
Calcium: 9.3 mg/dL (ref 8.9–10.3)
Chloride: 106 mmol/L (ref 98–111)
Creatinine, Ser: 0.8 mg/dL (ref 0.44–1.00)
GFR, Estimated: >60 mL/min
Glucose, Bld: 181 mg/dL — ABNORMAL HIGH (ref 70–99)
Potassium: 3.7 mmol/L (ref 3.5–5.1)
Sodium: 138 mmol/L (ref 135–145)
Total Bilirubin: 0.4 mg/dL (ref 0.0–1.2)
Total Protein: 6.5 g/dL (ref 6.5–8.1)

## 2024-10-26 LAB — GLUCOSE, CAPILLARY
Glucose-Capillary: 130 mg/dL — ABNORMAL HIGH (ref 70–99)
Glucose-Capillary: 145 mg/dL — ABNORMAL HIGH (ref 70–99)
Glucose-Capillary: 168 mg/dL — ABNORMAL HIGH (ref 70–99)
Glucose-Capillary: 169 mg/dL — ABNORMAL HIGH (ref 70–99)
Glucose-Capillary: 213 mg/dL — ABNORMAL HIGH (ref 70–99)
Glucose-Capillary: 224 mg/dL — ABNORMAL HIGH (ref 70–99)

## 2024-10-26 LAB — CBC
HCT: 35.9 % — ABNORMAL LOW (ref 36.0–46.0)
Hemoglobin: 11.5 g/dL — ABNORMAL LOW (ref 12.0–15.0)
MCH: 26.8 pg (ref 26.0–34.0)
MCHC: 32 g/dL (ref 30.0–36.0)
MCV: 83.7 fL (ref 80.0–100.0)
Platelets: 205 K/uL (ref 150–400)
RBC: 4.29 MIL/uL (ref 3.87–5.11)
RDW: 14.6 % (ref 11.5–15.5)
WBC: 12.2 K/uL — ABNORMAL HIGH (ref 4.0–10.5)
nRBC: 0 % (ref 0.0–0.2)

## 2024-10-26 LAB — MAGNESIUM: Magnesium: 2.6 mg/dL — ABNORMAL HIGH (ref 1.7–2.4)

## 2024-10-26 LAB — PHOSPHORUS: Phosphorus: 2.7 mg/dL (ref 2.5–4.6)

## 2024-10-26 LAB — TRIGLYCERIDES: Triglycerides: 257 mg/dL — ABNORMAL HIGH

## 2024-10-26 MED ORDER — POLYETHYLENE GLYCOL 3350 17 G PO PACK
17.0000 g | PACK | Freq: Every day | ORAL | Status: DC
  Administered 2024-10-26 – 2024-10-27 (×2): 17 g via ORAL
  Filled 2024-10-26 (×2): qty 1

## 2024-10-26 MED ORDER — INSULIN ASPART 100 UNIT/ML IJ SOLN
0.0000 [IU] | Freq: Three times a day (TID) | INTRAMUSCULAR | Status: DC
  Administered 2024-10-26: 7 [IU] via SUBCUTANEOUS
  Administered 2024-10-26 – 2024-10-27 (×4): 4 [IU] via SUBCUTANEOUS
  Administered 2024-10-28: 7 [IU] via SUBCUTANEOUS
  Administered 2024-10-28: 4 [IU] via SUBCUTANEOUS
  Filled 2024-10-26: qty 4
  Filled 2024-10-26: qty 7
  Filled 2024-10-26 (×2): qty 4
  Filled 2024-10-26: qty 7
  Filled 2024-10-26: qty 5

## 2024-10-26 MED ORDER — ORAL CARE MOUTH RINSE: 15.0000 mL | OROMUCOSAL | Status: DC | PRN

## 2024-10-26 NOTE — Progress Notes (Signed)
 Spoke to Pt who states she has a Hx of chronic constipation and she sees a gastroenterologist for these issues, stated her last BM was 10/23/2024 and she has a BM every 2-3 days. Messaged Stroke team regarding no BM since admission, see new orders.
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
## 2024-10-26 NOTE — Progress Notes (Signed)
 Dr. Rosemarie OK with Pt getting SLP re-consulted for advancement in diet. SLP team notified, team member will come up when they have time.

## 2024-10-27 ENCOUNTER — Inpatient Hospital Stay (HOSPITAL_COMMUNITY)

## 2024-10-27 DIAGNOSIS — I6340 Cerebral infarction due to embolism of unspecified cerebral artery: Secondary | ICD-10-CM | POA: Diagnosis not present

## 2024-10-27 DIAGNOSIS — R29701 NIHSS score 1: Secondary | ICD-10-CM

## 2024-10-27 DIAGNOSIS — R569 Unspecified convulsions: Secondary | ICD-10-CM | POA: Diagnosis not present

## 2024-10-27 DIAGNOSIS — G9389 Other specified disorders of brain: Secondary | ICD-10-CM | POA: Diagnosis not present

## 2024-10-27 LAB — CBC
HCT: 35.2 % — ABNORMAL LOW (ref 36.0–46.0)
Hemoglobin: 11.4 g/dL — ABNORMAL LOW (ref 12.0–15.0)
MCH: 27 pg (ref 26.0–34.0)
MCHC: 32.4 g/dL (ref 30.0–36.0)
MCV: 83.2 fL (ref 80.0–100.0)
Platelets: 228 K/uL (ref 150–400)
RBC: 4.23 MIL/uL (ref 3.87–5.11)
RDW: 14.2 % (ref 11.5–15.5)
WBC: 11.8 K/uL — ABNORMAL HIGH (ref 4.0–10.5)
nRBC: 0 % (ref 0.0–0.2)

## 2024-10-27 LAB — BASIC METABOLIC PANEL WITH GFR
Anion gap: 11 (ref 5–15)
BUN: 16 mg/dL (ref 8–23)
CO2: 22 mmol/L (ref 22–32)
Calcium: 9.4 mg/dL (ref 8.9–10.3)
Chloride: 104 mmol/L (ref 98–111)
Creatinine, Ser: 0.7 mg/dL (ref 0.44–1.00)
GFR, Estimated: >60 mL/min
Glucose, Bld: 177 mg/dL — ABNORMAL HIGH (ref 70–99)
Potassium: 4 mmol/L (ref 3.5–5.1)
Sodium: 137 mmol/L (ref 135–145)

## 2024-10-27 LAB — GLUCOSE, CAPILLARY
Glucose-Capillary: 183 mg/dL — ABNORMAL HIGH (ref 70–99)
Glucose-Capillary: 179 mg/dL — ABNORMAL HIGH (ref 70–99)
Glucose-Capillary: 179 mg/dL — ABNORMAL HIGH (ref 70–99)
Glucose-Capillary: 199 mg/dL — ABNORMAL HIGH (ref 70–99)

## 2024-10-27 LAB — PHOSPHORUS: Phosphorus: 2.9 mg/dL (ref 2.5–4.6)

## 2024-10-27 LAB — MAGNESIUM: Magnesium: 2.2 mg/dL (ref 1.7–2.4)

## 2024-10-27 MED ORDER — INSULIN GLARGINE 100 UNIT/ML ~~LOC~~ SOLN
12.0000 [IU] | Freq: Two times a day (BID) | SUBCUTANEOUS | Status: AC
  Administered 2024-10-27: 12 [IU] via SUBCUTANEOUS
  Filled 2024-10-27: qty 0.12

## 2024-10-27 MED ORDER — ASPIRIN 81 MG PO TBEC
81.0000 mg | DELAYED_RELEASE_TABLET | Freq: Every day | ORAL | Status: DC
  Administered 2024-10-28: 81 mg via ORAL
  Filled 2024-10-27: qty 1

## 2024-10-27 MED ORDER — INSULIN GLARGINE-YFGN 100 UNIT/ML ~~LOC~~ SOLN
12.0000 [IU] | Freq: Two times a day (BID) | SUBCUTANEOUS | Status: DC
  Administered 2024-10-28: 12 [IU] via SUBCUTANEOUS
  Filled 2024-10-27 (×2): qty 0.12

## 2024-10-27 MED ORDER — ROSUVASTATIN CALCIUM 20 MG PO TABS
20.0000 mg | ORAL_TABLET | Freq: Every day | ORAL | Status: DC
  Administered 2024-10-28: 20 mg via ORAL
  Filled 2024-10-27: qty 1

## 2024-10-27 MED ORDER — HALOPERIDOL LACTATE 5 MG/ML IJ SOLN
INTRAMUSCULAR | Status: AC
  Filled 2024-10-27: qty 1

## 2024-10-27 MED ORDER — HALOPERIDOL LACTATE 5 MG/ML IJ SOLN
2.0000 mg | Freq: Once | INTRAMUSCULAR | Status: AC
  Administered 2024-10-27: 2 mg via INTRAMUSCULAR

## 2024-10-27 MED ORDER — INSULIN GLARGINE 100 UNIT/ML ~~LOC~~ SOLN
12.0000 [IU] | Freq: Two times a day (BID) | SUBCUTANEOUS | Status: DC
  Filled 2024-10-27: qty 0.12

## 2024-10-27 MED ORDER — CLOPIDOGREL BISULFATE 75 MG PO TABS
75.0000 mg | ORAL_TABLET | Freq: Every day | ORAL | Status: DC
  Administered 2024-10-28: 75 mg via ORAL
  Filled 2024-10-27: qty 1

## 2024-10-27 MED ORDER — PANTOPRAZOLE SODIUM 40 MG PO TBEC: 40.0000 mg | DELAYED_RELEASE_TABLET | Freq: Every day | ORAL | Status: DC

## 2024-10-27 MED ORDER — HYDROMORPHONE HCL 1 MG/ML IJ SOLN
1.0000 mg | Freq: Once | INTRAMUSCULAR | Status: AC
  Administered 2024-10-27: 1 mg via INTRAVENOUS
  Filled 2024-10-27: qty 1

## 2024-10-27 MED ORDER — QUETIAPINE FUMARATE 25 MG PO TABS
25.0000 mg | ORAL_TABLET | Freq: Once | ORAL | Status: DC
  Filled 2024-10-27: qty 1

## 2024-10-27 NOTE — Inpatient Diabetes Management (Signed)
 Inpatient Diabetes Program Recommendations  AACE/ADA: New Consensus Statement on Inpatient Glycemic Control (2015)  Target Ranges:  Prepandial:   less than 140 mg/dL      Peak postprandial:   less than 180 mg/dL (1-2 hours)      Critically ill patients:  140 - 180 mg/dL   Lab Results  Component Value Date   GLUCAP 179 (H) 10/27/2024   HGBA1C 8.5 (A) 08/12/2024    Review of Glycemic Control  Diabetes history: DM2 Outpatient Diabetes medications: Humalog  70 units in am and 60 units before supper Current orders for Inpatient glycemic control: Lantus  12 units BID, Novolog  0-20 TID with meals  Consult for Discharge Recs (see below) Endo - Shamleffer. Last OV 08/12/2024 HgbA1C - 8.5%  Inpatient Diabetes Program Recommendations:    Will plan to see pt on 10/28/2024 regarding her HgbA1C of 8.5% and diabetes control at home.   Discharge Recommendations: Other recommendations: Has used Dexcom in past, and prefers glucose meter (Accu-chek) Intermediate acting recommendations: Insulin  Lispro Prot & Lispro (HUMALOG  75/25) Kwikpen 70 units in am and 60 units with supper meal  Supply/Referral recommendations: Pen needles - long (BMI >40) Referral to Nutrition & Diab Services   Use Adult Diabetes Insulin  Treatment Post Discharge order set.  Follow.  Thank you. Shona Brandy, RD, LDN, CDCES Inpatient Diabetes Coordinator [phone]
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
## 2024-10-27 NOTE — Progress Notes (Addendum)
 0100 Patient with increased restlessness and agitation. Removing equipment, removing gown, yelling out, trying to get out of bed, not redirectable. MD aware. Seroquel  ordered but patient stated she has had enough medication for the day and she isnt putting anymore chemicals into her body. Multiple attempts to educate and reassure patient. Daughter at bedside. MD aware of refusal.  [phone] Patient continues to try to get out of bed, screaming, refusing care, paranoid and hallucinating. Multiple attempts to reassure patient unsuccessful. MD aware. New orders placed.  0500 Patient awake, trying to get out of bed, screaming stating staff is trying to keep her here against her will. Staff tried to redirect patient and educate her. Patient became combative to nursing staff, continues to try to remove equipment and get out of bed. Daughter at bedside. MD aware, new orders placed for IM haldol  2mg  and restraints. Restraints applied due to patient interfering with care. Patient continues to yell and become more agitated with staff and daughter. Patient only oriented to self, moving all extremities. Multiple attempts to reassure patient unsuccessful.

## 2024-10-28 ENCOUNTER — Other Ambulatory Visit (HOSPITAL_COMMUNITY): Payer: Self-pay

## 2024-10-28 DIAGNOSIS — R29700 NIHSS score 0: Secondary | ICD-10-CM

## 2024-10-28 DIAGNOSIS — I6389 Other cerebral infarction: Secondary | ICD-10-CM | POA: Diagnosis not present

## 2024-10-28 LAB — CBC
HCT: 37.9 % (ref 36.0–46.0)
Hemoglobin: 12.4 g/dL (ref 12.0–15.0)
MCH: 27.1 pg (ref 26.0–34.0)
MCHC: 32.7 g/dL (ref 30.0–36.0)
MCV: 82.8 fL (ref 80.0–100.0)
Platelets: 288 K/uL (ref 150–400)
RBC: 4.58 MIL/uL (ref 3.87–5.11)
RDW: 13.8 % (ref 11.5–15.5)
WBC: 12.7 K/uL — ABNORMAL HIGH (ref 4.0–10.5)
nRBC: 0 % (ref 0.0–0.2)

## 2024-10-28 LAB — BASIC METABOLIC PANEL WITH GFR
Anion gap: 12 (ref 5–15)
BUN: 12 mg/dL (ref 8–23)
CO2: 22 mmol/L (ref 22–32)
Calcium: 9.8 mg/dL (ref 8.9–10.3)
Chloride: 102 mmol/L (ref 98–111)
Creatinine, Ser: 0.78 mg/dL (ref 0.44–1.00)
GFR, Estimated: >60 mL/min
Glucose, Bld: 170 mg/dL — ABNORMAL HIGH (ref 70–99)
Potassium: 4.1 mmol/L (ref 3.5–5.1)
Sodium: 136 mmol/L (ref 135–145)

## 2024-10-28 LAB — PHOSPHORUS: Phosphorus: 2.9 mg/dL (ref 2.5–4.6)

## 2024-10-28 LAB — GLUCOSE, CAPILLARY
Glucose-Capillary: 154 mg/dL — ABNORMAL HIGH (ref 70–99)
Glucose-Capillary: 228 mg/dL — ABNORMAL HIGH (ref 70–99)

## 2024-10-28 LAB — MAGNESIUM: Magnesium: 2.2 mg/dL (ref 1.7–2.4)

## 2024-10-28 MED ORDER — AMLODIPINE BESYLATE 10 MG PO TABS
10.0000 mg | ORAL_TABLET | Freq: Every day | ORAL | 1 refills | Status: AC
  Filled 2024-10-28: qty 30, 30d supply, fill #0

## 2024-10-28 MED ORDER — METOPROLOL TARTRATE 25 MG PO TABS
25.0000 mg | ORAL_TABLET | Freq: Two times a day (BID) | ORAL | 1 refills | Status: AC
  Filled 2024-10-28: qty 60, 30d supply, fill #0

## 2024-10-28 MED ORDER — METOPROLOL TARTRATE 25 MG PO TABS
25.0000 mg | ORAL_TABLET | Freq: Two times a day (BID) | ORAL | 1 refills | Status: DC
  Filled 2024-10-28: qty 30, 15d supply, fill #0

## 2024-10-28 MED ORDER — ROSUVASTATIN CALCIUM 20 MG PO TABS
20.0000 mg | ORAL_TABLET | Freq: Every day | ORAL | 1 refills | Status: AC
  Filled 2024-10-28: qty 30, 30d supply, fill #0

## 2024-10-28 MED ORDER — ASPIRIN 81 MG PO TBEC
81.0000 mg | DELAYED_RELEASE_TABLET | Freq: Every day | ORAL | 12 refills | Status: AC
  Filled 2024-10-28: qty 30, 30d supply, fill #0

## 2024-10-28 MED ORDER — CLOPIDOGREL BISULFATE 75 MG PO TABS
75.0000 mg | ORAL_TABLET | Freq: Every day | ORAL | 1 refills | Status: AC
  Filled 2024-10-28: qty 30, 30d supply, fill #0
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
## 2024-10-28 NOTE — Discharge Instructions (Signed)
 30 day heart monitor has been sent to your house.  Please place this per instructions in the package and send in to the cardiology team.  Please follow-up with your endocrinologist and primary care provider at discharge for better control of your diabetes.  We have sent a referral to [facility] for a 29-month follow-up as well.

## 2024-10-28 NOTE — Discharge Summary (Addendum)
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 advised to drink no more than 1 drink(s) a day Obesity, Body mass index is 37.46 kg/m., BMI >/= 30 associated with increased stroke risk, recommend weight loss, diet and exercise as appropriate    Other Active Problems GERD History of melanoma Sundowning, responding well to Seroquel  25 nightly   DISCHARGE EXAM  PHYSICAL EXAM General:  well-nourished, well-developed patient in no acute distress Psych:  Mood  and affect appropriate for situation CV: Regular rate and rhythm on monitor Respiratory:  Regular, unlabored respirations on room air GI: Abdomen soft and nontender     NEURO:  Mental Status: AA&Ox3, able to follow commands Speech/Language: speech is without aphasia.  Naming, repetition, fluency, and comprehension intact.   Cranial Nerves:  II: PERRL. Visual fields improved III, IV, VI: EOMI, eyelids elevate symmetrically.  V: Sensation is intact to light touch and symmetrical to face.  VII: Fslight right facial droop VIII: hearing intact to voice. IX, X: Palate elevates symmetrically. No dysarthria.  KP:Dynloizm shrug 5/5. XII: tongue is midline without fasciculations. Motor: 5/5 strength bilaterally Tone: is normal and bulk is normal Sensation: intact  bilaterally Coordination: FTN intact bilaterally.  Slightly decreased left hand fine motor movements Gait- deferred   Most Recent NIH: 0  1a Level of Conscious.: 0 1b LOC Questions: 0 1c LOC Commands: 0 2 Best Gaze: 0 3 Visual: 0 4 Facial Palsy: 0 5a Motor Arm - left: 0 5b Motor Arm - Right: 0 6a Motor Leg - Left: 0 6b Motor Leg - Right: 0 7 Limb Ataxia: 0 8 Sensory: 0 9 Best Language: 0 10 Dysarthria: 0 11 Extinct. and Inatten.: 0 TOTAL: 0   Discharge Diet       Diet   Diet heart healthy/carb modified Room service appropriate? Yes with Assist; Fluid consistency: Thin   liquids  DISCHARGE PLAN Disposition: Home with Home Health aspirin  81 mg daily and clopidogrel  75 mg daily for secondary stroke prevention for 3 weeks then aspirin  81 mg daily alone. Ongoing stroke risk factor control by Primary Care Physician at time of discharge Follow-up PCP Sun, Vyvyan, MD in 2 weeks. Follow up with Endocrinology  Follow-up in [facility] in 8 weeks, office to schedule an appointment. Able to see NP in clinic. 30 day heart monitor will be sent to their house  35 minutes were spent preparing discharge.  Patient seen and examined by NP/APP with MD. MD to update note as needed.   Jorene Last, DNP, FNP-BC [facility] Pager: [phone]  ATTENDING NOTE: I reviewed above note and agree with the assessment and plan. Pt was seen and examined.   Husband at bedside.  Patient sitting in chair, awake alert, orientated x 3, much improved mentally from yesterday.  Essentially neuroexam intact, PT OT worked with her today and changing recommendation to home health.  On DAPT for 3 weeks and then aspirin  alone, on Crestor  20.  Still has leukocytosis but improving, no fever.  Medically ready for discharge.  Follow-up as [facility] in 8 weeks.  For detailed assessment and plan, please refer to above as I have made changes wherever appropriate.   Ary Cummins, MD PhD Stroke Neurology 10/28/2024 11:26 PM

## 2024-10-28 NOTE — Progress Notes (Signed)
 Inpatient Rehab Admissions Coordinator:    Discussed CIR referral with pt and fact that PT recommended HH PT this AM. Daughter was present and husband was on the phone. They are in agreement to d/c home with [facility] PT/OT. I will not pursue CIR admit.   Leita Kleine, MS, CCC-SLP Rehab Admissions Coordinator  [phone] (celll) [phone] (office)
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
## 2024-10-30 ENCOUNTER — Telehealth: Payer: Self-pay | Admitting: Cardiovascular Disease

## 2024-10-30 NOTE — Telephone Encounter (Signed)
 Patient would like a call back to discuss monitor order.

## 2024-10-30 NOTE — Telephone Encounter (Signed)
 Spoke with patient, she has not received 30 day monitor that was ordered Will forward to monitor team to follow up   Scheduled post hospital/monitor visit 2/13 with Reche ORN NP  Patient aware of date, time, and location

## 2024-10-31 ENCOUNTER — Other Ambulatory Visit: Payer: Self-pay

## 2024-10-31 DIAGNOSIS — I639 Cerebral infarction, unspecified: Secondary | ICD-10-CM

## 2024-11-01 ENCOUNTER — Encounter: Payer: Self-pay | Admitting: Internal Medicine

## 2024-11-01 ENCOUNTER — Ambulatory Visit: Admitting: Internal Medicine

## 2024-11-01 VITALS — BP 130/70 | Ht 64.0 in | Wt 222.0 lb

## 2024-11-01 DIAGNOSIS — E1159 Type 2 diabetes mellitus with other circulatory complications: Secondary | ICD-10-CM | POA: Diagnosis not present

## 2024-11-01 DIAGNOSIS — E1165 Type 2 diabetes mellitus with hyperglycemia: Secondary | ICD-10-CM

## 2024-11-01 DIAGNOSIS — Z794 Long term (current) use of insulin: Secondary | ICD-10-CM

## 2024-11-01 DIAGNOSIS — E1142 Type 2 diabetes mellitus with diabetic polyneuropathy: Secondary | ICD-10-CM | POA: Diagnosis not present

## 2024-11-01 LAB — POCT GLYCOSYLATED HEMOGLOBIN (HGB A1C): Hemoglobin A1C: 9.1 % — AB (ref 4.0–5.6)

## 2024-11-01 MED ORDER — INSULIN LISPRO PROT & LISPRO (75-25 MIX) 100 UNIT/ML KWIKPEN: PEN_INJECTOR | SUBCUTANEOUS | Status: AC

## 2024-11-01 NOTE — Progress Notes (Signed)
 "  Name: Jamie Burke  Age/ Sex: 79 y.o., female   MRN/ DOB: [id number], June 29, 1946     PCP: Sun, Vyvyan, MD   Reason for Endocrinology Evaluation: Type 2 Diabetes Mellitus  Initial Endocrine Consultative Visit: 12/25/2019    PATIENT IDENTIFIER: [name] is a 79 y.o. female with a past medical history of T2DM and HTN. The patient has followed with Endocrinology clinic since 12/25/2019 for consultative assistance with management of her diabetes.     DIABETIC HISTORY:  Jamie Burke was diagnosed with DM > 20 yr ago. She has been on various  glycemic agents (metformin - weakness in the legs and incontinence, invokana - weakness in the legs. Victoza , Trulicity  , she does not recall any side effects ). Her hemoglobin A1c has ranged from 8.6% in 2012, peaking at 8.9% in 2021.  On her initial visit to our clinic she had an A1c of 9.2%, she was on insulin  mix and had metformin  but she was not taking it due to GI effects.   She had tried Rybelsus  twice the first time she stopped it in October 2022 due to leg pains, the second time she stopped it in May 2023 due to lip swelling   By April 2023 and due to persistent hyperglycemia we will switch Humalog  mix to basal/prandial, by May 2023 she called back stating lip swelling with Toujeo  and Rybelsus  and persistent hyperglycemia.  She returned to using insulin  mix and stopped Rybelsus    She did not tolerate Trulicity  due to abdominal pain 01/2023  Started Ozempic  07/2023 this was discontinued 02/2024 due to GI side effect with 0.5 mg dose  SUBJECTIVE:   During the last visit (08/12/2024): A1c 8.5%.      Today (11/01/2024): Jamie Burke is here for a follow up on diabetes management.  She checks her blood sugars 2 times daily. No glucose data today   She is accompanied by daughter Emile  Patient had a follow-up with podiatry 06/28/2024  The patient required an ED visit for CVA 10/23/2024, MRI revealed an acute infarct in the right genu of the  corpus callosum with mild edema   Following her stroke, she has noted some difficulty with swallowing, but no weakness per se, this morning has noted right shoulder soreness Per daughter she has been tiring out quicker No nausea  Has noted constipation   HOME DIABETES REGIMEN:  Humalog  Mix 70 units with Breakfast and 60 units with Supper    METER DOWNLOAD SUMMARY: n/a  Fasting 151 mg/dL   Yesterday supper 836 mg/dL   DIABETIC COMPLICATIONS: Microvascular complications:   Denies: CKD, retinopathy , neuropathy  Last eye exam: 11/13/2023   Macrovascular complications:   CVA (10/2024) Denies: CAD, PVD     HISTORY:  Past Medical History:  Past Medical History:  Diagnosis Date   Diabetes mellitus    Dysplastic nevi    Gastroparesis    GERD (gastroesophageal reflux disease)    Hypertension    Melanoma (HCC)    Snoring 03/28/2022   Tachycardia 03/28/2022   Past Surgical History:  Past Surgical History:  Procedure Laterality Date   ABDOMINAL HYSTERECTOMY     CARPAL TUNNEL RELEASE     BL   TRIGGER FINGER RELEASE     Social History:  reports that she has never smoked. She has never used smokeless tobacco. She reports that she does not drink alcohol and does not use drugs. Family History:  Family History  Problem Relation Age of Onset  Diabetes Mother    Heart disease Mother    Hypertension Mother    Stroke Mother    Hypertension Father    Stroke Father    Cancer Brother 3       Lung Cancer   Kidney disease Brother    Heart disease Maternal Aunt      HOME MEDICATIONS: Allergies as of 11/01/2024       Reactions   Atenolol Other (See Comments)   Atorvastatin  Other (See Comments)   Canagliflozin Other (See Comments)   Liraglutide Other (See Comments)   Lisinopril  Swelling   Lisinopril -hydrochlorothiazide  Other (See Comments)   Losartan Potassium-hctz Other (See Comments)   Metformin  Hcl Other (See Comments)   Sumatriptan  Other (See Comments)   Very crazy  dreams   Sulfa Antibiotics Rash        Medication List        Accurate as of November 01, 2024  2:29 PM. If you have any questions, ask your nurse or doctor.          amLODipine  10 MG tablet Commonly known as: NORVASC  Take 1 tablet (10 mg total) by mouth daily.   aspirin  EC 81 MG tablet Take 1 tablet (81 mg total) by mouth daily. Swallow whole.   clopidogrel  75 MG tablet Commonly known as: PLAVIX  Take 1 tablet (75 mg total) by mouth daily.   Insulin  Lispro Prot & Lispro (75-25) 100 UNIT/ML Kwikpen Commonly known as: HumaLOG  Mix 75/25 KwikPen Inject 70 Units into the skin daily before breakfast AND 60 Units daily before supper.   metoprolol  tartrate 25 MG tablet Commonly known as: LOPRESSOR  Take 1 tablet (25 mg total) by mouth 2 (two) times daily.   rosuvastatin  20 MG tablet Commonly known as: CRESTOR  Take 1 tablet (20 mg total) by mouth daily.         OBJECTIVE:   Vital Signs: BP 130/70   Ht 5' 4 (1.626 m)   Wt 222 lb (100.7 kg)   BMI 38.11 kg/m   Wt Readings from Last 3 Encounters:  11/01/24 222 lb (100.7 kg)  10/26/24 218 lb 4.1 oz (99 kg)  08/12/24 225 lb (102.1 kg)     Exam: General: Pt appears well and is in NAD  Lungs: Clear with good BS bilat   Heart: RRR   Extremities: No pretibial edema.   Neuro: MS is good with appropriate affect, pt is alert and Ox3    DM foot exam: 06/28/2024 through podiatry    DATA REVIEWED:  Lab Results  Component Value Date   HGBA1C 9.1 (A) 11/01/2024   HGBA1C 8.5 (A) 08/12/2024   HGBA1C 8.8 (A) 04/09/2024      Latest Reference Range & Units 10/28/24 01:13  Sodium 135 - 145 mmol/L 136  Potassium 3.5 - 5.1 mmol/L 4.1  Chloride 98 - 111 mmol/L 102  CO2 22 - 32 mmol/L 22  Glucose 70 - 99 mg/dL 829 (H)  BUN 8 - 23 mg/dL 12  Creatinine 9.55 - 8.99 mg/dL 9.21  Calcium  8.9 - 10.3 mg/dL 9.8  Anion gap 5 - 15  12  Phosphorus 2.5 - 4.6 mg/dL 2.9  Magnesium  1.7 - 2.4 mg/dL 2.2  GFR, Estimated >39 mL/min >60   (H): Data is abnormally high  Latest Reference Range & Units 08/12/24 12:43  Microalb, Ur mg/dL 0.6  MICROALB/CREAT RATIO <30 mg/g creat 18  Creatinine, Urine 20 - 275 mg/dL 33   Old records , labs and images have been reviewed.  ASSESSMENT / PLAN / RECOMMENDATIONS:   1) Type 2 Diabetes Mellitus, poorly controlled, With Neuropathic and macrovascular complications - Most recent A1c of 9.1 %. Goal A1c <7.0 %.      -Patient continues with poorly controlled DM -She continues to use less insulin  than previously prescribed -She tends to adjust her insulin  based on her Premeal BG readings, I did advise the patient against making adjustment based on her Premeal BG readings, but would rather make adjustments based on her oral intake, she may decrease the Humalog  mix if she is going to eat a smaller meal than usual to prevent hypoglycemia but not if her BG is normal -She could not tolerate Ozempic  0.5 mg due to GI side effects, she was getting it through patient assistance program - She is intolerant to metformin , as well as invokana -Intolerant to Rybelsus   due to leg pains , and the second time lip swelling -I have attempted to switch her from insulin  mix to multiple daily injections of insulin  but this was not a successful experience for her -She did not tolerate Trulicity  due to abdominal pain -She has attempted to use the Dexcom but insertion was painful and she opted to continue with fingersticks  MEDICATIONS: Change  Humalog  Mix 66 units with breakfast, 60 units with supper   EDUCATION / INSTRUCTIONS: BG monitoring instructions: Patient is instructed to check her blood sugars 2 times a day, before meals. Call [facility] if: BG persistently < 70  I reviewed the Rule of 15 for the treatment of hypoglycemia in detail with the patient. Literature supplied.    2) Diabetic complications:   Eye: Does not have known diabetic retinopathy.  Neuro/ Feet: Does have  known diabetic peripheral neuropathy based on her  Exam  Renal: Patient does not have known baseline CKD. She is not on an ACEI/ARB at present.C She is allergic to lisinopril     3) Dyslipidemia :  - Historically she has declined statin therapy as well as Zetia  -Patient was started on rosuvastatin  in January, 2026 following CVA   F/U in 3 months    Signed electronically by: Stefano Redgie Butts, MD  [facility]  [facility] [address] [place] [place], [place] [address] Phone: [phone] FAX: [phone]   CC: Austin Nutley, MD [address] [place] [address] Phone: [phone]  Fax: [phone]  Return to Endocrinology clinic as below: Future Appointments  Date Time Provider Department Center  11/28/2024  9:00 AM Sherryl Bouchard, NP [facility] None  12/06/2024  8:25 AM Vannie Reche RAMAN, NP [facility] [id number]  01/01/2025  8:50 AM Arthelia Callicott, Donell Redgie, MD [facility] None        "

## 2024-11-01 NOTE — Patient Instructions (Addendum)
-   Change Humalog  Mix 66 units with Breakfast and 60 units with Supper   HOW TO TREAT LOW BLOOD SUGARS (Blood sugar LESS THAN 70 MG/DL) Please follow the RULE OF 15 for the treatment of hypoglycemia treatment (when your (blood sugars are less than 70 mg/dL)   STEP 1: Take 15 grams of carbohydrates when your blood sugar is low, which includes:  3-4 GLUCOSE TABS  OR 3-4 OZ OF JUICE OR REGULAR SODA OR ONE TUBE OF GLUCOSE GEL    STEP 2: RECHECK blood sugar in 15 MINUTES STEP 3: If your blood sugar is still low at the 15 minute recheck --> then, go back to STEP 1 and treat AGAIN with another 15 grams of carbohydrates.

## 2024-11-04 NOTE — Telephone Encounter (Signed)
 Patient receive her monitor and she now has questions. Please advise

## 2024-11-04 NOTE — Telephone Encounter (Signed)
 Returning call regarding having questions about the [employer] event monitor. Please call [facility] in monitors at [phone] to discuss the monitor or schedule an appointment to be given a tutorial and have the monitor applied and started.
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
## 2024-11-07 ENCOUNTER — Ambulatory Visit: Admitting: Adult Health

## 2024-11-07 ENCOUNTER — Encounter: Payer: Self-pay | Admitting: Adult Health

## 2024-11-07 VITALS — BP 132/84 | HR 77 | Ht 64.0 in | Wt 222.8 lb

## 2024-11-07 DIAGNOSIS — E78019 Familial hypercholesterolemia, unspecified: Secondary | ICD-10-CM

## 2024-11-07 DIAGNOSIS — Z794 Long term (current) use of insulin: Secondary | ICD-10-CM | POA: Diagnosis not present

## 2024-11-07 DIAGNOSIS — I639 Cerebral infarction, unspecified: Secondary | ICD-10-CM

## 2024-11-07 DIAGNOSIS — E1142 Type 2 diabetes mellitus with diabetic polyneuropathy: Secondary | ICD-10-CM

## 2024-11-07 NOTE — Patient Instructions (Signed)
 Your Plan:  Continue Aspirin  and Plavix . Stop plavix  after 3 weeks  Blood pressure goal <130/90 Cholesterol LDL goal <70 Diabetes goal A1c <7 Can start seroquel  if hallucinations worsen Monitor diet and try to exercise   Thank you for coming to see us  at [facility]. I hope we have been able to provide you high quality care today.  You may receive a patient satisfaction survey over the next few weeks. We would appreciate your feedback and comments so that we may continue to improve ourselves and the health of our patients.

## 2024-11-11 ENCOUNTER — Telehealth: Payer: Self-pay | Admitting: Adult Health

## 2024-11-11 NOTE — Telephone Encounter (Signed)
 Patient said having soreness in the top of head. Want to know if that's normal side effect from the stroke had on 10/23/24. Patient discussed with PCP and was told blood pressure was 125/63 that find and was not related to stroke.

## 2024-11-12 NOTE — Telephone Encounter (Signed)
 I called pt.  She is asking if her head is tender around her L side head, by her ear, she says also some feeling of ear stuffed up.  She did mention to her pcp and they told her to take tylenol . I relayed that having a stroke is in the brain, not as tenderness on the head.  She is not having any acute symptoms. (No dizzness, vision, headache, weakness ).   She will see her pcp and discuss at her appt as well as the ehtanol level of <15.

## 2024-11-14 NOTE — Progress Notes (Signed)
 Discussed imaging with Dr. Rosemarie- with [facility] on scan its mild- as long as she isn't having symptoms nothing further needed.

## 2024-11-22 ENCOUNTER — Telehealth: Payer: Self-pay | Admitting: Cardiovascular Disease

## 2024-11-22 NOTE — Telephone Encounter (Signed)
 Patient has questions about her heart monitor. She is not sure how long to wear and is also out of adhesives for it. Please advise.

## 2024-11-22 NOTE — Telephone Encounter (Signed)
 Spoke with patient regarding monitor Advised to wear for 30 days and will need to reach out to number on monitor information to request more electrodes

## 2024-11-25 ENCOUNTER — Telehealth: Payer: Self-pay | Admitting: Cardiovascular Disease

## 2024-11-25 NOTE — Telephone Encounter (Signed)
 Returned a call back to the pt.   Pt is having trouble with applying her 30 day monitor.  She states the device keeps falling off, the patches are not sticking well to her skin, and she keeps getting a beeping sound coming from the monitor. Pt also adds the current electrodes is causing skin irritation, and she needs the sensitive ones, if offered.  Pt adds I am 78 and this is too hard for me to put on myself.  Pt is needing an appointment scheduled with our monitor team, for monitor placement and to provide her the sensitive electrodes, for she has extremely sensitive skin.  Informed the pt that I will have our monitor techs reach out to her to coordinate in making her an appointment to come in and have them place the 30 day monitor on for her.  Pt states she will have to have her daughter transport her to this appointment, so our monitor techs will need to work with the pt in getting this scheduled.   Informed the pt that she may not hear from our monitor team until tomorrow (due to inclement weather) to help arrange the appointment for monitor placement.  Pt is aware I will have them call her back when available to assist with this.  Will also inform them that she prefers the sensitive electrodes, if possible.  Pt verbalized understanding and agrees with this plan.  She will await to hear back from our monitor team.

## 2024-11-27 NOTE — Telephone Encounter (Signed)
 Returned call to patient and answered all her questions. Patient was confused about getting new patches and mailed her first monitor back. The company sent a new monitor to her to finish out her last 8-10 days. After speaking with her about her issues and telling her we were getting good data on for the first 18 days. I encouraged her to apply the 2nd monitor and finish the last few days but patient has opted to mail the monitor back.

## 2024-11-28 ENCOUNTER — Inpatient Hospital Stay: Admitting: Adult Health

## 2024-12-06 ENCOUNTER — Ambulatory Visit (HOSPITAL_BASED_OUTPATIENT_CLINIC_OR_DEPARTMENT_OTHER): Admitting: Family

## 2025-01-01 ENCOUNTER — Ambulatory Visit: Admitting: Internal Medicine

## 2025-02-03 ENCOUNTER — Ambulatory Visit: Admitting: Internal Medicine

## 2025-10-20 ENCOUNTER — Ambulatory Visit: Admitting: Adult Health
